# Patient Record
Sex: Female | Born: 1960 | Race: White | Hispanic: No | Marital: Married | Smoking: Former smoker
Health system: Southern US, Community
[De-identification: ages and names within clinical notes are randomized; demographics above are authoritative.]

## PROBLEM LIST (undated history)

## (undated) DIAGNOSIS — R32 Unspecified urinary incontinence: Secondary | ICD-10-CM

## (undated) DIAGNOSIS — K219 Gastro-esophageal reflux disease without esophagitis: Secondary | ICD-10-CM

## (undated) DIAGNOSIS — G4733 Obstructive sleep apnea (adult) (pediatric): Secondary | ICD-10-CM

## (undated) DIAGNOSIS — M1990 Unspecified osteoarthritis, unspecified site: Secondary | ICD-10-CM

## (undated) DIAGNOSIS — J020 Streptococcal pharyngitis: Secondary | ICD-10-CM

## (undated) DIAGNOSIS — M26609 Unspecified temporomandibular joint disorder, unspecified side: Secondary | ICD-10-CM

## (undated) DIAGNOSIS — N3946 Mixed incontinence: Secondary | ICD-10-CM

## (undated) DIAGNOSIS — I10 Essential (primary) hypertension: Secondary | ICD-10-CM

## (undated) DIAGNOSIS — J019 Acute sinusitis, unspecified: Secondary | ICD-10-CM

## (undated) DIAGNOSIS — A389 Scarlet fever, uncomplicated: Secondary | ICD-10-CM

## (undated) DIAGNOSIS — G4730 Sleep apnea, unspecified: Secondary | ICD-10-CM

## (undated) DIAGNOSIS — G4710 Hypersomnia, unspecified: Secondary | ICD-10-CM

## (undated) DIAGNOSIS — R1011 Right upper quadrant pain: Secondary | ICD-10-CM

## (undated) DIAGNOSIS — Z9889 Other specified postprocedural states: Secondary | ICD-10-CM

## (undated) DIAGNOSIS — R112 Nausea with vomiting, unspecified: Secondary | ICD-10-CM

## (undated) DIAGNOSIS — R002 Palpitations: Secondary | ICD-10-CM

## (undated) DIAGNOSIS — N920 Excessive and frequent menstruation with regular cycle: Secondary | ICD-10-CM

## (undated) DIAGNOSIS — Z8709 Personal history of other diseases of the respiratory system: Secondary | ICD-10-CM

## (undated) DIAGNOSIS — R0789 Other chest pain: Secondary | ICD-10-CM

## (undated) DIAGNOSIS — C801 Malignant (primary) neoplasm, unspecified: Secondary | ICD-10-CM

## (undated) HISTORY — PX: TONSILLECTOMY: SUR1361

## (undated) HISTORY — DX: Unspecified temporomandibular joint disorder, unspecified side: M26.609

## (undated) HISTORY — DX: Gastro-esophageal reflux disease without esophagitis: K21.9

## (undated) HISTORY — DX: Hypersomnia, unspecified: G47.10

## (undated) HISTORY — DX: Morbid (severe) obesity due to excess calories: E66.01

## (undated) HISTORY — DX: Scarlet fever, uncomplicated: A38.9

## (undated) HISTORY — PX: TUBAL LIGATION: SHX77

## (undated) HISTORY — PX: EYE SURGERY: SHX253

## (undated) HISTORY — DX: Mixed incontinence: N39.46

## (undated) HISTORY — DX: Other chest pain: R07.89

## (undated) HISTORY — DX: Acute sinusitis, unspecified: J01.90

## (undated) HISTORY — DX: Excessive and frequent menstruation with regular cycle: N92.0

## (undated) HISTORY — DX: Unspecified urinary incontinence: R32

## (undated) HISTORY — PX: CHOLECYSTECTOMY: SHX55

## (undated) HISTORY — DX: Streptococcal pharyngitis: J02.0

## (undated) HISTORY — DX: Palpitations: R00.2

## (undated) HISTORY — DX: Sleep apnea, unspecified: G47.30

## (undated) HISTORY — DX: Obstructive sleep apnea (adult) (pediatric): G47.33

## (undated) HISTORY — PX: FRACTURE SURGERY: SHX138

## (undated) HISTORY — DX: Right upper quadrant pain: R10.11

## (undated) SURGERY — COLONOSCOPY WITH PROPOFOL
Anesthesia: Monitor Anesthesia Care

---
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
## 1995-10-27 HISTORY — PX: REFRACTIVE SURGERY: SHX103

## 1998-10-26 HISTORY — PX: EXTREMITY CYST EXCISION: SHX1558

## 2005-10-26 HISTORY — PX: KNEE ARTHROSCOPY: SUR90

## 2005-11-16 ENCOUNTER — Emergency Department: Payer: Self-pay | Admitting: Emergency Medicine

## 2005-11-26 ENCOUNTER — Emergency Department: Payer: Self-pay | Admitting: Internal Medicine

## 2007-01-18 ENCOUNTER — Other Ambulatory Visit: Admission: RE | Admit: 2007-01-18 | Discharge: 2007-01-18 | Payer: Self-pay | Admitting: Obstetrics & Gynecology

## 2007-03-22 ENCOUNTER — Ambulatory Visit (HOSPITAL_BASED_OUTPATIENT_CLINIC_OR_DEPARTMENT_OTHER): Admission: RE | Admit: 2007-03-22 | Discharge: 2007-03-22 | Payer: Self-pay | Admitting: Obstetrics and Gynecology

## 2007-08-15 ENCOUNTER — Emergency Department (HOSPITAL_COMMUNITY): Admission: EM | Admit: 2007-08-15 | Discharge: 2007-08-15 | Payer: Self-pay | Admitting: Emergency Medicine

## 2007-08-23 ENCOUNTER — Encounter (INDEPENDENT_AMBULATORY_CARE_PROVIDER_SITE_OTHER): Payer: Self-pay | Admitting: General Surgery

## 2007-08-23 ENCOUNTER — Ambulatory Visit (HOSPITAL_COMMUNITY): Admission: RE | Admit: 2007-08-23 | Discharge: 2007-08-24 | Payer: Self-pay | Admitting: General Surgery

## 2007-11-30 ENCOUNTER — Ambulatory Visit: Payer: Self-pay | Admitting: Family Medicine

## 2007-11-30 DIAGNOSIS — M26609 Unspecified temporomandibular joint disorder, unspecified side: Secondary | ICD-10-CM | POA: Insufficient documentation

## 2007-11-30 DIAGNOSIS — R32 Unspecified urinary incontinence: Secondary | ICD-10-CM | POA: Insufficient documentation

## 2007-11-30 DIAGNOSIS — J019 Acute sinusitis, unspecified: Secondary | ICD-10-CM | POA: Insufficient documentation

## 2007-11-30 DIAGNOSIS — K219 Gastro-esophageal reflux disease without esophagitis: Secondary | ICD-10-CM | POA: Insufficient documentation

## 2007-11-30 DIAGNOSIS — N3946 Mixed incontinence: Secondary | ICD-10-CM | POA: Insufficient documentation

## 2007-11-30 DIAGNOSIS — A389 Scarlet fever, uncomplicated: Secondary | ICD-10-CM | POA: Insufficient documentation

## 2007-11-30 LAB — CONVERTED CEMR LAB
Bilirubin Urine: NEGATIVE
Blood in Urine, dipstick: NEGATIVE
Glucose, Urine, Semiquant: NEGATIVE
Urobilinogen, UA: NEGATIVE
WBC Urine, dipstick: NEGATIVE

## 2007-12-06 LAB — CONVERTED CEMR LAB
ALT: 18 units/L (ref 0–35)
AST: 15 units/L (ref 0–37)
Alkaline Phosphatase: 118 units/L — ABNORMAL HIGH (ref 39–117)
Calcium: 8.8 mg/dL (ref 8.4–10.5)
GFR calc non Af Amer: 96 mL/min
LDL Cholesterol: 85 mg/dL (ref 0–99)
Potassium: 4.4 meq/L (ref 3.5–5.1)
Sodium: 139 meq/L (ref 135–145)
Total Bilirubin: 0.7 mg/dL (ref 0.3–1.2)
Total CHOL/HDL Ratio: 4.4
Total Protein: 7.4 g/dL (ref 6.0–8.3)

## 2007-12-13 ENCOUNTER — Encounter (INDEPENDENT_AMBULATORY_CARE_PROVIDER_SITE_OTHER): Payer: Self-pay | Admitting: *Deleted

## 2007-12-13 ENCOUNTER — Ambulatory Visit: Payer: Self-pay | Admitting: Internal Medicine

## 2007-12-13 DIAGNOSIS — J020 Streptococcal pharyngitis: Secondary | ICD-10-CM | POA: Insufficient documentation

## 2008-01-25 ENCOUNTER — Encounter: Admission: RE | Admit: 2008-01-25 | Discharge: 2008-01-25 | Payer: Self-pay | Admitting: Family Medicine

## 2008-02-09 ENCOUNTER — Ambulatory Visit: Payer: Self-pay | Admitting: Family Medicine

## 2008-02-09 DIAGNOSIS — R0789 Other chest pain: Secondary | ICD-10-CM | POA: Insufficient documentation

## 2008-02-24 ENCOUNTER — Ambulatory Visit: Payer: Self-pay | Admitting: Cardiology

## 2008-03-12 ENCOUNTER — Ambulatory Visit: Payer: Self-pay | Admitting: Cardiology

## 2008-06-05 ENCOUNTER — Telehealth: Payer: Self-pay | Admitting: Family Medicine

## 2008-06-05 ENCOUNTER — Ambulatory Visit: Payer: Self-pay | Admitting: Family Medicine

## 2008-06-05 DIAGNOSIS — R1011 Right upper quadrant pain: Secondary | ICD-10-CM | POA: Insufficient documentation

## 2008-06-05 LAB — CONVERTED CEMR LAB
Specific Gravity, Urine: 1.02
Urobilinogen, UA: 0.2
pH: 6

## 2008-06-06 LAB — CONVERTED CEMR LAB
ALT: 13 units/L (ref 0–35)
AST: 15 units/L (ref 0–37)
Albumin: 3.1 g/dL — ABNORMAL LOW (ref 3.5–5.2)
Basophils Relative: 0.6 % (ref 0.0–3.0)
Bilirubin, Direct: 0.1 mg/dL (ref 0.0–0.3)
Eosinophils Absolute: 0.1 10*3/uL (ref 0.0–0.7)
GFR calc Af Amer: 138 mL/min
GFR calc non Af Amer: 114 mL/min
Lipase: 19 units/L (ref 11.0–59.0)
Monocytes Relative: 8 % (ref 3.0–12.0)
Neutrophils Relative %: 55.2 % (ref 43.0–77.0)
Potassium: 3.9 meq/L (ref 3.5–5.1)
RDW: 15.9 % — ABNORMAL HIGH (ref 11.5–14.6)
Sodium: 139 meq/L (ref 135–145)
Total Protein: 6.2 g/dL (ref 6.0–8.3)
WBC: 5.7 10*3/uL (ref 4.5–10.5)

## 2008-07-09 ENCOUNTER — Telehealth: Payer: Self-pay | Admitting: Family Medicine

## 2008-07-10 ENCOUNTER — Ambulatory Visit: Payer: Self-pay | Admitting: Family Medicine

## 2008-07-10 DIAGNOSIS — N920 Excessive and frequent menstruation with regular cycle: Secondary | ICD-10-CM | POA: Insufficient documentation

## 2008-07-11 LAB — CONVERTED CEMR LAB
Basophils Absolute: 0 10*3/uL (ref 0.0–0.1)
Basophils Relative: 0.4 % (ref 0.0–3.0)
HCT: 39.7 % (ref 36.0–46.0)
Hemoglobin: 13.6 g/dL (ref 12.0–15.0)
Lymphocytes Relative: 33 % (ref 12.0–46.0)
Monocytes Absolute: 0.7 10*3/uL (ref 0.1–1.0)
Neutro Abs: 4.3 10*3/uL (ref 1.4–7.7)
RDW: 15.3 % — ABNORMAL HIGH (ref 11.5–14.6)
TSH: 1.46 microintl units/mL (ref 0.35–5.50)
WBC: 7.5 10*3/uL (ref 4.5–10.5)

## 2008-07-23 ENCOUNTER — Telehealth: Payer: Self-pay | Admitting: Family Medicine

## 2008-07-23 ENCOUNTER — Encounter (INDEPENDENT_AMBULATORY_CARE_PROVIDER_SITE_OTHER): Payer: Self-pay | Admitting: *Deleted

## 2008-09-06 ENCOUNTER — Emergency Department: Payer: Self-pay | Admitting: Emergency Medicine

## 2008-10-03 ENCOUNTER — Ambulatory Visit: Payer: Self-pay | Admitting: Family Medicine

## 2008-11-30 ENCOUNTER — Telehealth: Payer: Self-pay | Admitting: Family Medicine

## 2008-11-30 DIAGNOSIS — G4710 Hypersomnia, unspecified: Secondary | ICD-10-CM | POA: Insufficient documentation

## 2008-12-14 ENCOUNTER — Ambulatory Visit: Payer: Self-pay | Admitting: Pulmonary Disease

## 2008-12-22 ENCOUNTER — Ambulatory Visit (HOSPITAL_BASED_OUTPATIENT_CLINIC_OR_DEPARTMENT_OTHER): Admission: RE | Admit: 2008-12-22 | Discharge: 2008-12-22 | Payer: Self-pay | Admitting: Pulmonary Disease

## 2008-12-22 ENCOUNTER — Encounter: Payer: Self-pay | Admitting: Pulmonary Disease

## 2008-12-26 ENCOUNTER — Telehealth: Payer: Self-pay | Admitting: Pulmonary Disease

## 2008-12-27 ENCOUNTER — Telehealth (INDEPENDENT_AMBULATORY_CARE_PROVIDER_SITE_OTHER): Payer: Self-pay | Admitting: *Deleted

## 2009-01-01 ENCOUNTER — Ambulatory Visit: Payer: Self-pay | Admitting: Pulmonary Disease

## 2009-01-10 ENCOUNTER — Ambulatory Visit (HOSPITAL_COMMUNITY): Admission: RE | Admit: 2009-01-10 | Discharge: 2009-01-11 | Payer: Self-pay | Admitting: Orthopedic Surgery

## 2009-01-14 ENCOUNTER — Ambulatory Visit: Payer: Self-pay | Admitting: Pulmonary Disease

## 2009-01-14 DIAGNOSIS — G4733 Obstructive sleep apnea (adult) (pediatric): Secondary | ICD-10-CM | POA: Insufficient documentation

## 2009-01-25 ENCOUNTER — Encounter: Admission: RE | Admit: 2009-01-25 | Discharge: 2009-01-25 | Payer: Self-pay | Admitting: Family Medicine

## 2009-01-28 ENCOUNTER — Encounter (INDEPENDENT_AMBULATORY_CARE_PROVIDER_SITE_OTHER): Payer: Self-pay | Admitting: *Deleted

## 2009-02-01 ENCOUNTER — Ambulatory Visit: Payer: Self-pay | Admitting: Vascular Surgery

## 2009-02-01 ENCOUNTER — Encounter (INDEPENDENT_AMBULATORY_CARE_PROVIDER_SITE_OTHER): Payer: Self-pay | Admitting: Orthopedic Surgery

## 2009-02-01 ENCOUNTER — Ambulatory Visit (HOSPITAL_COMMUNITY): Admission: RE | Admit: 2009-02-01 | Discharge: 2009-02-01 | Payer: Self-pay | Admitting: Orthopedic Surgery

## 2009-05-07 ENCOUNTER — Telehealth: Payer: Self-pay | Admitting: Pulmonary Disease

## 2009-05-19 ENCOUNTER — Encounter: Payer: Self-pay | Admitting: Pulmonary Disease

## 2009-05-22 ENCOUNTER — Telehealth (INDEPENDENT_AMBULATORY_CARE_PROVIDER_SITE_OTHER): Payer: Self-pay | Admitting: *Deleted

## 2009-05-27 ENCOUNTER — Ambulatory Visit: Payer: Self-pay | Admitting: Family Medicine

## 2009-07-09 ENCOUNTER — Ambulatory Visit: Payer: Self-pay | Admitting: Family Medicine

## 2009-07-09 DIAGNOSIS — J069 Acute upper respiratory infection, unspecified: Secondary | ICD-10-CM | POA: Insufficient documentation

## 2009-07-09 LAB — CONVERTED CEMR LAB: Rapid Strep: NEGATIVE

## 2009-07-31 ENCOUNTER — Encounter: Payer: Self-pay | Admitting: Pulmonary Disease

## 2009-11-18 ENCOUNTER — Telehealth: Payer: Self-pay | Admitting: Internal Medicine

## 2010-02-12 ENCOUNTER — Encounter: Admission: RE | Admit: 2010-02-12 | Discharge: 2010-02-12 | Payer: Self-pay | Admitting: Family Medicine

## 2010-02-12 LAB — HM MAMMOGRAPHY: HM Mammogram: NEGATIVE

## 2010-03-15 ENCOUNTER — Emergency Department (HOSPITAL_COMMUNITY): Admission: EM | Admit: 2010-03-15 | Discharge: 2010-03-16 | Payer: Self-pay | Admitting: Emergency Medicine

## 2010-03-17 ENCOUNTER — Telehealth: Payer: Self-pay | Admitting: Family Medicine

## 2010-03-17 DIAGNOSIS — R002 Palpitations: Secondary | ICD-10-CM | POA: Insufficient documentation

## 2010-03-27 ENCOUNTER — Ambulatory Visit: Payer: Self-pay | Admitting: Cardiovascular Disease

## 2010-11-16 ENCOUNTER — Encounter: Payer: Self-pay | Admitting: Family Medicine

## 2010-11-25 NOTE — Progress Notes (Signed)
  Phone Note Call from Patient Call back at Home Phone [phone]   Caller: Patient Call For: Kerby Nora MD Summary of Call: Patient called she has been sick for 2 weeks daughter tested psoitive for strep at [facility] in [place] confirmed with nurse marcie. Spoke with dr Alphonsus Sias he said if i confirmed could call in penicillin 500mg  two times a day for 10 days Initial call taken by: Benny Lennert CMA Duncan Dull),  November 18, 2009 4:22 PM    New/Updated Medications: PENICILLIN V POTASSIUM 500 MG TABS (PENICILLIN V POTASSIUM) one tablet two times daily for 10 days Prescriptions: PENICILLIN V POTASSIUM 500 MG TABS (PENICILLIN V POTASSIUM) one tablet two times daily for 10 days  #20 x 0   Entered by:   Benny Lennert CMA (AAMA)   Authorized by:   Cindee Salt MD   Signed by:   Benny Lennert CMA (AAMA) on 11/18/2009   Method used:   Electronically to        [facility]  [phone]* (retail)       [address]       [place], [place]  [address]       Ph: [phone] or [phone]       Fax: [phone]   RxID:   [id number]

## 2010-11-25 NOTE — Assessment & Plan Note (Signed)
Summary: HEADACHE AND FEVER,SINUS,ETC  CYD   Vital Signs:  Patient profile:   50 year old female Height:      63.5 inches Weight:      229.25 pounds BMI:     40.12 Temp:     98.2 degrees F oral Pulse rate:   88 / minute Pulse rhythm:   regular Resp:     16 per minute BP sitting:   108 / 70  (left arm) Cuff size:   large  Vitals Entered By: Lewanda Rife LPN (July 09, 2009 11:23 AM)  Primary Care Provider:  Ermalene Searing  CC:  headache, fever, sinus, sorethroat, and earsache and feels achy.  History of Present Illness: Here for URI signs with fever--onset x 4d--started with HA and fever --no cough --fevers stopped yesterday --taking mucinex 12h,   tylenol, flonase --started yesterday --missed work yesterday and today  Allergies: 1)  ! Erythromycin Base (Erythromycin Base)  Review of Systems      See HPI  Physical Exam  General:  alert, well-developed, well-nourished, and well-hydrated.   Eyes:  pupils equal, pupils round, and no injection.   Ears:  TMs retracted with increased fluid Nose:  no airflow obstruction, mucosal erythema, and mucosal edema.  sinuses neg Mouth:  no exudates and pharyngeal erythema.   Lungs:  moist cough only, no crackles and no wheezes.   Neurologic:  alert & oriented X3 and gait normal.   Cervical Nodes:  no anterior cervical adenopathy and no posterior cervical adenopathy.   Psych:  normally interactive and subdued.     Impression & Recommendations:  Problem # 1:  URI (ICD-465.9) Assessment New continue comfort care measures: increase po fluids, rest, tylenol or IBP as needed will continue to use flonase--discussed proper technique to administer will add claritin 1-2 once daily as needed congestion work note through 9/16--see back or call if worsens and will add ABT Her updated medication list for this problem includes:    Mobic 15 Mg Tabs (Meloxicam) .Marland Kitchen... 1 once daily  Complete Medication List: 1)  Mobic 15 Mg Tabs (Meloxicam) .Marland Kitchen.. 1  once daily  Current Allergies (reviewed today): ! ERYTHROMYCIN BASE (ERYTHROMYCIN BASE)  Laboratory Results  Date/Time Received: July 09, 2009 11:25 AM  Date/Time Reported: July 09, 2009 11:25 AM   Other Tests  Rapid Strep: negative  Kit Test Internal QC: Positive   (Normal Range: Negative)

## 2010-11-25 NOTE — Assessment & Plan Note (Signed)
Summary: EC6/AMD   Visit Type:  Initial Consult Referring Provider:  DR. Kerby Nora Primary Provider:  Kerby Nora MD  CC:  palpitations.  History of Present Illness: [name] is a very pleasant obese 50 year old woman with a history of obstructive sleep apnea who has been wearing CPAP over the past year, presenting for evaluation of recent cardiac palpitations and fluttering.  She reports that her symptoms have been going on for years that have been severe over the past week or 2. She states that it has been going on all week. When she presented to the emergency room for further evaluation, there were no arrhythmias noted on telemetry. She was referred for followup with her primary care physician. She reports when she has the fluttering, she feels that she has to cough. It comes on at rest and not with exertion. She feels that it does not stop and it is irregular and keeps going. She is unable to detail whether it is rapid or irregular rhythm.  She denies any new lower extremity edema. No new shortness of breath with exertion. Otherwise she has felt well. She is trying to lose some weight and interested in any methods that might help her.  Current Medications (verified): 1)  None  Allergies (verified): 1)  ! 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 cause, HTN sister: fibromyalgia brother: ? heart issue MGM: colon cancer  Social History: Last updated: 12/14/2008 Occupation: office support [employer] Married Former smoker.  Quit in 2003. Alcohol use-no Drug use-no Regular exercise-no Diet: fruits and veggies, chicken, no water, lots of decaf  Risk Factors: Exercise: no (11/30/2007)  Risk Factors: Smoking Status: never (11/30/2007)  Review of Systems       The patient complains of weight gain.  The patient denies fever, weight loss, vision loss, decreased hearing, hoarseness, chest pain, syncope, dyspnea on exertion, peripheral edema, prolonged cough, abdominal pain, incontinence, muscle weakness, depression, and enlarged lymph nodes.         palpitations, fluttering in chest x >1 week  Vital Signs:  Patient profile:   50 year old female Height:      63.5 inches Weight:      245 pounds BMI:     42.87 Pulse rate:   81 / minute BP sitting:   155 / 93  (left arm) Cuff size:   large  Vitals Entered By: Bishop Dublin, CMA (March 27, 2010 2:59 PM)  Physical Exam  General:  Well developed, well nourished, in no acute distress.Obese Head:  normocephalic and atraumatic Neck:  Neck supple, no JVD. No masses, thyromegaly or abnormal cervical nodes. Lungs:  Clear bilaterally to auscultation and percussion. Heart:  Non-displaced PMI, chest non-tender; regular rate and rhythm, S1, S2 without murmurs, rubs or gallops. Carotid upstroke normal, no bruit. . Pedals normal pulses. No edema, no varicosities. Abdomen:  Bowel sounds positive; abdomen soft and non-tender without masses, organomegaly, or hernias noted. No hepatosplenomegaly. Msk:  Back normal, normal gait. Muscle strength  and tone normal. Pulses:  pulses normal in all 4 extremities Extremities:  No clubbing or cyanosis. Neurologic:  Alert and  oriented x 3. Skin:  Intact without lesions or rashes. Psych:  Normal affect.    EKG  Procedure date:  03/27/2010  Findings:      normal sinus rhythm with rate 81 beats per minute, no significant ST or T wave changes.  Impression & Recommendations:  Problem # 1:  PALPITATIONS (ICD-785.1) etiology of her chest pain is uncertain. She may have ectopy including APCs VPCs. I cannot exclude atrial fibrillation or atrial flutter or even SVT. We have suggested she wear an event monitor and keep a diary.  I have given her a prescription for metoprolol tartrate 25 mg b.i.d. to take as needed if she has bad days with palpitations.  We went with her again in 6 weeks to go over her event monitor and asked her to contact me if her symptoms are significant In the meantime.  Her updated medication list for this problem includes:    Metoprolol Tartrate 25 Mg Tabs (Metoprolol tartrate) .Marland Kitchen... Take one tablet by mouth twice a day as needed for palpatations  Problem # 2:  MORBID OBESITY (ICD-278.01) we talked a little bit about ways to lose weight including various diets, increasing her exercise. I encouraged her to join a gym as this will provide some Versed he and his social placed to exercise.  Patient Instructions: 1)  Your physician recommends that you schedule a follow-up appointment in: 6 WEEKS  2)  Your physician has recommended you make the following change in your medication: METOPROLOL 25MG  two times a day as needed FOR PALPATATIONS.  3)  Your physician has recommended that you wear an event monitor.  Event monitors are medical devices that record the heart's electrical activity. Doctors most often use these monitors to diagnose arrhythmias. Arrhythmias are problems with the speed or rhythm of the heartbeat. The monitor is a small, portable device. You can wear one while you do your normal daily activities. This is usually used to diagnose what is causing palpitations/syncope (passing  out). Prescriptions: METOPROLOL TARTRATE 25 MG TABS (METOPROLOL TARTRATE) Take one tablet by mouth twice a day as needed FOR PALPATATIONS  #60 x 3   Entered by:   Benedict Needy, RN   Authorized by:   Dossie Arbour MD   Signed by:   Benedict Needy, RN on 03/27/2010   Method used:   Electronically to        [facility]  [phone]* (retail)       [address]       [place], [place]  [address]       Ph: [phone] or [phone]       Fax: [phone]   RxID:   [id number]

## 2010-11-25 NOTE — Progress Notes (Signed)
Summary: heart palpitations   Phone Note Call from Patient Call back at [phone]   Caller: Patient Call For: Kerby Nora MD/ Dr. Patsy Lager Summary of Call: Patient was seen at [facility] ER over the weekend with having heart palpitations and she says that it happens quite often, but was very frequent over the weekend. She was there for 5 hrs. and the palpitations would never show up on the EKG. She wants to know if she needs to come in and follow up with you or wait and see if it happens again. They also mentioned a portable monitor to her and she wants to know if you think that would be a good idea. Please advise.  Initial call taken by: Melody Comas,  Mar 17, 2010 1:25 PM  Follow-up for Phone Call        I think that is a reasonable thing -- I would probably have her see cardiology to get their opinion.  Cardiac event monitor is reasonable to think about. Follow-up by: Hannah Beat MD,  Mar 17, 2010 2:47 PM  New Problems: PALPITATIONS (ICD-785.1)   New Problems: PALPITATIONS (ICD-785.1)

## 2011-01-12 LAB — URINALYSIS, ROUTINE W REFLEX MICROSCOPIC
Bilirubin Urine: NEGATIVE
Glucose, UA: NEGATIVE mg/dL
Nitrite: NEGATIVE
Protein, ur: NEGATIVE mg/dL

## 2011-01-12 LAB — RAPID URINE DRUG SCREEN, HOSP PERFORMED
Amphetamines: NOT DETECTED
Barbiturates: NOT DETECTED
Benzodiazepines: NOT DETECTED
Cocaine: NOT DETECTED

## 2011-01-12 LAB — CBC
MCHC: 34.1 g/dL (ref 30.0–36.0)
MCV: 88.9 fL (ref 78.0–100.0)
Platelets: 270 10*3/uL (ref 150–400)
RBC: 4.18 MIL/uL (ref 3.87–5.11)
RDW: 14.4 % (ref 11.5–15.5)

## 2011-01-12 LAB — DIFFERENTIAL
Eosinophils Relative: 1 % (ref 0–5)
Lymphocytes Relative: 30 % (ref 12–46)
Neutro Abs: 4.8 10*3/uL (ref 1.7–7.7)
Neutrophils Relative %: 61 % (ref 43–77)

## 2011-01-12 LAB — POCT CARDIAC MARKERS: CKMB, poc: <1 ng/mL — ABNORMAL LOW (ref 1.0–8.0)

## 2011-01-12 LAB — BASIC METABOLIC PANEL
BUN: 16 mg/dL (ref 6–23)
Chloride: 105 mEq/L (ref 96–112)

## 2011-02-05 LAB — BASIC METABOLIC PANEL
Potassium: 4.9 mEq/L (ref 3.5–5.1)
Sodium: 139 mEq/L (ref 135–145)

## 2011-02-05 LAB — APTT: aPTT: 30 seconds (ref 24–37)

## 2011-02-05 LAB — URINALYSIS, ROUTINE W REFLEX MICROSCOPIC
Bilirubin Urine: NEGATIVE
Ketones, ur: NEGATIVE mg/dL
Nitrite: NEGATIVE
Protein, ur: NEGATIVE mg/dL
Urobilinogen, UA: 1 mg/dL (ref 0.0–1.0)

## 2011-02-05 LAB — DIFFERENTIAL
Basophils Absolute: 0 10*3/uL (ref 0.0–0.1)
Eosinophils Absolute: 0 10*3/uL (ref 0.0–0.7)
Neutro Abs: 3.9 10*3/uL (ref 1.7–7.7)
Neutrophils Relative %: 63 % (ref 43–77)

## 2011-02-05 LAB — CBC
HCT: 41.6 % (ref 36.0–46.0)
Hemoglobin: 13.9 g/dL (ref 12.0–15.0)
MCHC: 33.5 g/dL (ref 30.0–36.0)
MCV: 90.6 fL (ref 78.0–100.0)
Platelets: 295 10*3/uL (ref 150–400)
WBC: 6.2 10*3/uL (ref 4.0–10.5)

## 2011-02-16 ENCOUNTER — Other Ambulatory Visit: Payer: Self-pay | Admitting: Family Medicine

## 2011-02-16 DIAGNOSIS — Z1231 Encounter for screening mammogram for malignant neoplasm of breast: Secondary | ICD-10-CM

## 2011-02-19 ENCOUNTER — Ambulatory Visit
Admission: RE | Admit: 2011-02-19 | Discharge: 2011-02-19 | Disposition: A | Discharge status: Home / Self Care | Payer: BC Managed Care – PPO | Source: Ambulatory Visit | Attending: Family Medicine | Admitting: Family Medicine

## 2011-02-19 DIAGNOSIS — Z1231 Encounter for screening mammogram for malignant neoplasm of breast: Secondary | ICD-10-CM

## 2011-02-24 ENCOUNTER — Encounter: Payer: Self-pay | Admitting: *Deleted

## 2011-03-10 NOTE — Op Note (Signed)
NAMEYVONDA, FOUTY                  ACCOUNT NO.:  [id number]   MEDICAL RECORD NO.:  [id number]          PATIENT TYPE:  OIB   LOCATION:  1525                         FACILITY:  [facility]   PHYSICIAN:  Anselm Pancoast. Weatherly, M.D.DATE OF BIRTH:  [birth year]   DATE OF PROCEDURE:  08/23/2007  DATE OF DISCHARGE:                               OPERATIVE REPORT   PREOPERATIVE DIAGNOSIS:  Subacute cholecystitis with stones.   POSTOPERATIVE DIAGNOSIS:  Subacute cholecystitis with stones.   OPERATIONS:  Laparoscopic cholecystectomy with cholangiogram.   HISTORY:  Carol Crosby is a 50 year old female who was referred to our  office from the ER at [facility] where she presented last Monday with an  attack that had lasted all Sunday night and they did an extensive workup  including liver function studies.  Ultrasound of the gallbladder showed  a large stone within the gallbladder.  They did not give her any pain  medication but advised her to be seen by the general surgeons and I  first saw her in the office yesterday.  She was still kind of mildly  tender, not febrile, but definitely tender with palpation of the right  upper quadrant and I recommended we go ahead and proceed with a fairly  prompt cholecystectomy.  She is about 230 pounds, is on only medications  pain which I gave her yesterday.  I repeated the CBC and CMET today  since it has been a week and they were still normal and she is here for  the planned procedure.  She was preoperatively given 3 grams of Unasyn.  She has got PAS stockings and was taken to the operative suite.  Induction of general anesthesia, endotracheal tube, oral tube to  stomach.  The time-out was done the patient identified.  She received  antibiotics.  Everybody was in agreement, etc.  We then made a small  incision below the umbilicus.  The fatty tissues down to 3 inches or so  below the fascia.  The fascia was picked up with two Kochers and small  opening made.  The  underlying peritoneum was picked up kind of deep and  a small opening made.  A pursestring suture of 0 Vicryl was placed and  the Hasson cannula introduced.  The gallbladder was quite tense but it  is not acutely hemorrhagic type inflamed.  It is definitely edematous.  The upper 10 mL trocar was placed after anesthetizing the fascia,  subxiphoid area, two lateral 5 mm trocars were placed.  She did not have  any significant adhesions around the gallbladder.  I pulled the  gallbladder up and out laterally and then the proximal portion of  gallbladder was kind of dissected, opening up the peritoneum I could  identify the cystic artery where it goes to the anterior surface of the  gallbladder just past the cystic duct lymph node and I encompassed this  with a right-angle, two clips proximally, single distally and then  divided the artery.  This then allowed the cystic duct to the straighten  out.  It was kind of an S shape where  it has been obstructed and I  encompassed it with a right angle and put a clip across the distal  cystic duct where the gallbladder joined.  I then made a little small  opening and bile was identified.  The [facility] catheter was induced, held in  place with a clip and then x-ray was obtained.  It showed good prompt  filling of the extrahepatic biliary system, good flow into the duodenum.  The cystic duct is probably about 2 cm in length.  I removed the  catheter, triply clipped the little cystic duct and then divided.  We  then very carefully teased out the gallbladder from the edematous  areolar tissue, I could see what I thing was a posterior branch of  cystic artery kind of going laterally and this was clipped and that the  gallbladder was freed from its bed with good hemostasis.  The  gallbladder was placed in an EndoCatch bag reinspected the liver bed,  looking down at the clips, everything looked fine and we then switched  the camera to the upper 10 mm port after  placing the gallbladder into an  EndoCatch bag.  I then pulled the neck of the gallbladder bag up into  the skin level, then could grab the gallbladder, pulled it up, opened  it, aspirated the bile and then using a towel ring clamp went into the  gallbladder kind of grabbed the big stone and then could kind of twist  it out through the fascia without making the incision any larger.  The  bag of course and the stones were all removed and then we placed an  additional figure-of-eight of 0 Vicryl in the fascia with a little  larger needle.  You could look under the undersurface and there was no  adhesions or anything.  I then tied the pursestring as well with the  figure-of-eight that I had placed and then placed about 15 mL of  Marcaine with Adrenalin in the fascia at the umbilicus.  The little  irrigating fluid was minimal that had been aspirated.  I then removed  the 5 mL ports under direct vision and released the CO2.  The upper 10  mL trocar was then removed.  I removed 5 mm under direct vision and then  the subcutaneous wounds were closed with 4-0 Vicryl.  Benzoin and Steri-  Strips on skin.  The patient tolerated procedure nicely.  The estimated  blood loss was minimal.  We will let her decide whether she wants to go  home today or spend the night and go home in the morning.  I think it  will be fine if she elects to go home.           ______________________________  Anselm Pancoast. Zachery Dakins, M.D.     WJW/MEDQ  D:  08/23/2007  T:  08/24/2007  Job:  [id number]

## 2011-03-10 NOTE — Procedures (Signed)
NAME:  Carol Crosby, Carol Crosby NO.:  [id number]   MEDICAL RECORD NO.:  [id number]          PATIENT TYPE:  OUT   LOCATION:  SLEEP CENTER                 FACILITY:  [facility]   PHYSICIAN:  Carol Crosby Share, MD,FCCPDATE OF BIRTH:  August 03, 1961   DATE OF STUDY:  12/22/2008                            NOCTURNAL POLYSOMNOGRAM   REFERRING PHYSICIAN:  Barbaraann Share, MD,FCCP   REFERRING PHYSICIAN:  Barbaraann Share, MD,FCCP.   LOCATION:  Sleep Lab.   INDICATIONS FOR STUDY:  Hypersomnia with sleep apnea.   EPWORTH SCORE:  10.   SLEEP ARCHITECTURE:  The patient had a total sleep time of 390 minutes  with only 39 minutes of slow wave sleep and 26 minutes of REM noted.  Sleep onset latency was normal at 29 minutes, and REM onset was  prolonged at 235 minutes.  Sleep efficiency was mildly decreased at 88%.   RESPIRATORY DATA:  The patient was found to have no obstructive apneas,  but 417 obstructive hypopneas.  This gave her an apnea-hypopnea index of  64 events per hour.  She was noted to have loud snoring throughout, and  the events occurred in all body positions.   OXYGEN DATA:  The patient had oxygen desaturation as low as 63% with her  obstructive events.  In fact, she spent 248 minutes less than 88%  saturation during the night.   CARDIAC DATA:  No clinically significant arrhythmias were seen.   MOVEMENT/ PARASOMNIA:  No significant leg jerks or abnormal behaviors  seen.   IMPRESSION/RECOMMENDATIONS:  Severe obstructive sleep apnea/ hypopnea  syndrome with an apnea-hypopnea index of 64 events per hour, and oxygen  desaturation as low as 63%.  Treatment for this degree of sleep apnea  should focus primarily on weight loss as well as CPAP.      Carol Crosby Share, MD,FCCP  Diplomate, American Board of Sleep  Medicine  Electronically Signed     KMC/MEDQ  D:  01/01/2009 08:25:54  T:  01/01/2009 20:50:06  Job:  [id number]

## 2011-03-10 NOTE — Op Note (Signed)
[name], Carol Crosby                  ACCOUNT NO.:  [id number]   MEDICAL RECORD NO.:  [id number]          PATIENT TYPE:  OIB   LOCATION:  5012                         FACILITY:  [facility]   PHYSICIAN:  Almedia Balls. Ranell Patrick, M.D. DATE OF BIRTH:  08-24-1961   DATE OF PROCEDURE:  01/10/2009  DATE OF DISCHARGE:                               OPERATIVE REPORT   PREOPERATIVE DIAGNOSES:  Left knee medial meniscus tear and lateral  meniscus tear.   POSTOPERATIVE DIAGNOSES:  1. Left knee medial meniscus tear.  2. Left knee lateral meniscus tear.  3. Left knee chondromalacia, patellofemoral joint, grade 3 and 4.  4. Lateral tibial condyle chondromalacia, grade 3.   PROCEDURE PERFORMED:  Left knee arthroscopy, partial medial and lateral  meniscectomies, abrasion chondroplasty, patellofemoral joint to bleeding  bone as well as chondroplasty lateral compartment not to bleeding bone.   SURGEON:  Almedia Balls. Ranell Patrick, MD   ASSISTANT:  None.   ANESTHESIA:  General anesthesia was used.   ESTIMATED BLOOD LOSS:  Minimal.   FLUID REPLACEMENT:  1200 mL crystalloid.   INSTRUMENT COUNT:  Correct.   COMPLICATIONS:  None.   Preoperative antibiotics were given.   INDICATIONS:  The patient is a 50 year old female with a history of  worsening left medial knee pain secondary to torn medial meniscus.  The  patient presents now for operative treatment having failed conservative  management.  Informed consent was obtained.   DESCRIPTION OF PROCEDURE:  After an adequate level of anesthesia was  achieved, the patient was positioned supine on the operating room table.  The left leg was correctly identified and a lateral approach was  utilized.  After sterile prep and drape of the left knee, we entered the  knee using standard arthroscopic portals including superolateraloutflow,  anterolateral scope, and anteromedial working portals.  We identified  the significant patellofemoral chondromalacia, grade 3 and 4,  performed  the chondroplasty of the patellar essentially in the trochlea.  There  was a large area in trochlear wear all the way down to bone.  We did  some chondroplasty getting some bleeding.  Medial and lateral gutters  were free of loose bodies.  No significant medial plica was noted.  The  anterior horn of the medial meniscus was torn.  We performed partial  meniscectomy using basket forceps and motorized shaver.  The remainder  of the middle and posterior horn of the medial meniscus was normal.  ACL, PCL were intact.  Posterior aspect of the knee normal.  The lateral  compartment was entered.  There was chondromalacia present in the tibial  condyle with loose flaps cartilage, grade 3 chondromalacia, performed a  chondroplasty on that.  There was posterior and lateral meniscus tear,  which was treated with the partial meniscectomy using basket forceps and  motorized shaver.  This represented about 20%-30% of the meniscus had to  be removed.  Popliteus is normal.  The remainder of the knee was scoped  and deemed to be normal.  At this point, we conclude the surgery  suturing the wounds with 4-0 Monocryl followed by  Steri-Strips and  sterile compressive bandage.  The patient tolerated the surgery well.      Almedia Balls. Ranell Patrick, M.D.  Electronically Signed     SRN/MEDQ  D:  01/10/2009  T:  01/11/2009  Job:  [id number]

## 2011-03-10 NOTE — Discharge Summary (Signed)
NAMESHASTINA, RUA                  ACCOUNT NO.:  [id number]   MEDICAL RECORD NO.:  [id number]          PATIENT TYPE:  OIB   LOCATION:  5012                         FACILITY:  [facility]   PHYSICIAN:  Almedia Balls. Ranell Patrick, M.D. DATE OF BIRTH:  [birth year]   DATE OF ADMISSION:  01/10/2009  DATE OF DISCHARGE:  01/11/2009                               DISCHARGE SUMMARY   ADMISSION DIAGNOSIS:  Left knee pain secondary to medial meniscal tear.   DISCHARGE DIAGNOSIS:  Left knee pain secondary to medial meniscal tear  status post left knee arthroscopy.   BRIEF HISTORY:  The patient is a 50 year old female with worsening left  knee pain secondary to medial meniscal tear.  The patient has elected  for surgical management.   PROCEDURE:  The patient had a left knee arthroscopy with medial meniscal  debridement by Dr. Malon Kindle on January 10, 2009.  No complications.  No assistant.  General anesthesia was used.   HOSPITAL COURSE:  The patient was admitted on January 10, 2009 for the  above-stated procedure which she tolerated well.  After adequate time in  Postanesthesia Care Unit, she was transferred up to 5000.  On postop day  #1, the patient complained of minimal pain but overall was doing quite  well.  She worked with physical therapy well before discharge from  hospital and she was afebrile throughout her entire stay.  Her condition  is stable.   DISCHARGE PLAN:  To be discharged home with home exercises.   ALLERGIES:  The patient has no known drug allergies.   DISCHARGE MEDICATIONS:  1. Mobic 50 mg p.o. daily.  2. Robaxin 500 mg p.o. q.6 h.  3. Percocet 5/325 one to two tablets q.4-6 hours p.r.n. pain.  4. Lovenox 30 mg subcu b.i.d.   FOLLOWUP:  The patient will follow back with Dr. Malon Kindle in 2  weeks.      Thomas B. Durwin Nora, P.A.      Almedia Balls. Ranell Patrick, M.D.  Electronically Signed    TBD/MEDQ  D:  01/11/2009  T:  01/12/2009  Job:  [id number]

## 2011-03-10 NOTE — Assessment & Plan Note (Signed)
[facility] OFFICE NOTE   Carol Crosby, Carol Crosby                         MRN:          [id number]  DATE:02/24/2008                            DOB:          1961-04-27    REFERRING PHYSICIAN:  Kerby Nora, MD   REASON FOR CONSULTATION:  Evaluate patient with chest pain.   HISTORY OF PRESENT ILLNESS:  The patient is a pleasant 50 year old white  female without prior cardiac history.  She has had palpitations for some  time.  Most recently, over the last couple of weeks, she has had chest  discomfort.  This seems to be happening about every other day.  She  describes a discomfort under her left breast.  It may radiate around to  the right breast.  It is sharp.  At one point it was 8/10 in intensity  and then lasted for a few hours, slowly resolving.  When she got it and  it was intense, she was somewhat diaphoretic.  It would go away on its  own.  There was no associated nausea, vomiting or shortness of breath.  She did not notice any palpitations with this.  She had no presyncope or  syncope.  She did not notice if it was worse with breathing or moving.  She had never had discomfort like this before.  She says it happens at  rest.  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  She had done this since the pain has been coming on.   PAST MEDICAL HISTORY:  The patient has no history of hypertension other  than toxemia during pregnancy.  She had no diabetes or high cholesterol.   PAST SURGICAL HISTORY:  1. Cholecystectomy.  2. C-section.  3. Cyst removed.   ALLERGIES:  ERYTHROMYCIN.   MEDICATIONS:  Detrol LA.   SOCIAL HISTORY:  The patient quit smoking in 2003.  She works in [employer].  She is married.  She has 3 children.   FAMILY HISTORY:  Noncontributory for early coronary artery disease.  She  does have a brother with a tachycardia; he is in [place] and apparently  going to have some kind of an ablation.   REVIEW OF SYSTEMS:  As stated in the HPI and positive for headaches,  palpitations occasionally at night, reflux, negative for other systems.   PHYSICAL EXAMINATION:  The patient is in no distress.  Blood pressure 119/83, heart rate 91 and regular, weight 230 pounds.  HEENT:  Eye are unremarkable; pupils are equal, round and reactive to  light; fundi not visualized; oral mucosa unremarkable.  NECK:  No  jugular distention at 45 degrees; carotid upstroke brisk and  symmetrical; no bruits; no thyromegaly.  LYMPHATICS:  No cervical, axillary or inguinal adenopathy.  LUNGS:  Clear to auscultation bilaterally.  BACK:  No costovertebral angle tenderness.  CHEST:  Unremarkable.  HEART:  PMI not displaced or sustained, S1-S2 within normal.  No S3, no  S4, no clicks, no rubs, no murmurs.  ABDOMEN:  Obese; positive bowel  sounds, normal in frequency and pitch;  no bruits, no rebound, no guarding, no midline pulsatile mass, no  hepatomegaly, no splenomegaly.  SKIN:  No rashes, no nodules.  EXTREMITIES:  Pulse 2+ throughout; no edema, no cyanosis, no clubbing.  NEUROLOGIC:  Oriented to person, place and time; cranial nerves II-XII  grossly intact; motor grossly intact.   EKG:  Sinus rhythm, rate 90, axis within normal, intervals within  normal, no acute ST- or T-wave changes.   ASSESSMENT AND PLAN:  1. Chest discomfort:  The patient's chest discomfort is somewhat      atypical.  She does not have severe cardiovascular risk factors.  I      think the pretest probability of obstructive coronary disease is      somewhat low.  Screening with an exercise treadmill test would be      most appropriate; this will allow me to screen for obstructive      coronary disease, risk-stratify and most importantly give this      patient a prescription for exercise.  2. Obesity:  We discussed weight loss with diet and exercise and I      will give her more instruction  on this at time of her POET (plain      old exercise treadmill).     Rollene Rotunda, MD, [facility]  Electronically Signed    JH/MedQ  DD: 02/24/2008  DT: 02/24/2008  Job #: [id number]   cc:   Kerby Nora, MD

## 2011-03-10 NOTE — Op Note (Signed)
NAMEMEDIA, PIZZINI                  ACCOUNT NO.:  [id number]   MEDICAL RECORD NO.:  [id number]          PATIENT TYPE:  AMB   LOCATION:  [facility]                         FACILITY:  [facility]   PHYSICIAN:  Cynthia P. Romine, M.D.DATE OF BIRTH:  1961/06/04   DATE OF PROCEDURE:  03/22/2007  DATE OF DISCHARGE:                               OPERATIVE REPORT   PREOPERATIVE DIAGNOSIS:  Menorrhagia.   POSTOPERATIVE DIAGNOSIS:  Menorrhagia.   PROCEDURE:  Novasure endometrial ablation and hysteroscopy.   SURGEON:  Dr. Arline Asp Romine.   ANESTHESIA:  General by LMA.   ESTIMATED BLOOD LOSS:  Minimal.   LACTATED RINGER'S DEFICIT HYSTEROSCOPY:  25 mL   COMPLICATIONS:  None.   PROCEDURE:  The patient was taken to the operating room and after  induction of adequate general anesthesia by LMA was placed in dorsal  lithotomy position and prepped and draped in usual fashion.  The bladder  was drained with a red rubber catheter.  A posterior weighted and  anterior Sims retractor placed.  The cervix was grasped on its anterior  lip with a single-tooth tenaculum.  Paracervical block was instituted by  injecting 10 mL of 1% plain Xylocaine at each of 3 and 9 o'clock.  Uterus sounded to 10 cm.  The cervix was dilated to #8 Hegar.  The  endocervical canal was estimated to be 3.5 cm long.  Therefore the  cavity length was 6.5 cm and the Novasure device was set for the cavity  length.  The Novasure was introduced.  The array was deployed.  The  array was seated and the cavity width was noted to be 4.8 cm.  The  collar was then pressed up against the cervix.  The cavity integrity  test was passed and ablation was started.  The ablation lasted 1 minute  and 12 seconds.  Following completion of the procedure the Novasure  device was removed.  Hysteroscopy was done which revealed an endometrial  cavity that appeared well ablated photographic documentation was taken.  The scope was removed.  The instruments removed  from vagina and the  procedure was terminated.  The patient was taken to recovery room in  satisfactory condition.      Cynthia P. Romine, M.D.  Electronically Signed     CPR/MEDQ  D:  03/22/2007  T:  03/22/2007  Job:  [id number]

## 2011-03-10 NOTE — Procedures (Signed)
[facility]                              EXERCISE TREADMILL   NAME:COOKPeyten, Punches                         MRN:          [id number]  DATE:03/12/2008                            DOB:          04-02-1961    PROCEDURE:  Exercise treadmill test.   INDICATION:  Chest pain.   PROCEDURE NOTE:  The patient is exercised using standard Bruce protocol.  She was able to exercise for 6 minutes and 30 seconds.  This was 30 30  seconds into stage III.  She achieved a target heart rate with a maximum  of 157 which was 90% of predicted.  She achieved 7.7 mets.  She had an  appropriate blood pressure response with a max of 161/88.  The test was  terminated because she had achieved a target heart rate and because of  fatigue.  She had no chest pain.  She had appropriate shortness of  breath though she had a poor exercise tolerance.  She had no ischemic ST-  T wave changes and no arrhythmias.  She had a normal heart rate  recovery.   CONCLUSION:  Negative adequate exercise treadmill test.  She had  moderate to poor exercise tolerance for her age.  There were no features  of high-grade obstructive coronary disease.   PLAN:  Based on the above, no further cardiovascular testing is  suggested.  The patient should continue with aggressive risk reduction.  Based on this, I gave her a prescription for exercise.     Rollene Rotunda, MD, [facility]  Electronically Signed    JH/MedQ  DD: 03/12/2008  DT: 03/12/2008  Job #: [id number]   cc:   Kerby Nora, MD

## 2011-03-16 ENCOUNTER — Ambulatory Visit (INDEPENDENT_AMBULATORY_CARE_PROVIDER_SITE_OTHER): Payer: BC Managed Care – PPO | Admitting: Family Medicine

## 2011-03-16 ENCOUNTER — Encounter: Payer: Self-pay | Admitting: Family Medicine

## 2011-03-16 ENCOUNTER — Encounter: Payer: Self-pay | Admitting: *Deleted

## 2011-03-16 VITALS — BP 110/72 | HR 96 | Temp 98.6°F | Ht 62.0 in | Wt 237.1 lb

## 2011-03-16 DIAGNOSIS — J019 Acute sinusitis, unspecified: Secondary | ICD-10-CM

## 2011-03-16 DIAGNOSIS — R509 Fever, unspecified: Secondary | ICD-10-CM

## 2011-03-16 DIAGNOSIS — R51 Headache: Secondary | ICD-10-CM

## 2011-03-16 LAB — POCT RAPID STREP A (OFFICE): Rapid Strep A Screen: NEGATIVE

## 2011-03-16 MED ORDER — DOXYCYCLINE HYCLATE 100 MG PO CAPS: 100.0000 mg | ORAL_CAPSULE | Freq: Two times a day (BID) | ORAL | Status: AC

## 2011-03-16 NOTE — Assessment & Plan Note (Addendum)
Fever with headache description similar to sinus headache.  Suspect acute sinusitis, treat with doxycycline (will cover rickettsial although pt denies tick bite). Discussed red flags to return including if not improving as expected or any worsening. Lungs clear today, heart without murmur, no neck stiffness nor abd pain, or AMS, AFVSS today. RST neg today.

## 2011-03-16 NOTE — Progress Notes (Signed)
  Subjective:    Patient ID: Carol Crosby, female    DOB: [birth year], 50 y.o.   MRN: [id number]  HPI CC: fever  3d h/o fever, Tmax 103 Saturday.  Body aches, joint pain, HA - characterized as pressure with throbbing, occasional sharp frontal and temple pain, ear pain.  HA worse with bending forward or laying down.  Last night temperature to 101.  So far has tried tyelnol, ibuprofen, pseudophed, theraflu.  No cough, ST, dysuria, chest pain/tightness, abd pain, SOB, confusion, neck stiffness.  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  No tick bites, not outside recently.  H/o sinus infections in past, somewhat feel like this but not with body aches.  A few weeks ago also with fever, cough but that got better.  No recent travel outside of country.   No sick contacts.  No smokers at home, no h/o asthma.  Review of Systems Per HPI    Objective:   Physical Exam  Nursing note and vitals reviewed. Constitutional: She appears well-developed and well-nourished.       Tired, congested appearing  HENT:  Head: Normocephalic and atraumatic.  Right Ear: Tympanic membrane, external ear and ear canal normal.  Left Ear: Tympanic membrane, external ear and ear canal normal.  Nose: Right sinus exhibits no maxillary sinus tenderness and no frontal sinus tenderness. Left sinus exhibits no maxillary sinus tenderness and no frontal sinus tenderness.  Mouth/Throat: Oropharynx is clear and moist. No oropharyngeal exudate.  Eyes: Conjunctivae and EOM are normal. Pupils are equal, round, and reactive to light. No scleral icterus.  Neck: Normal range of motion. Neck supple.  Cardiovascular: Normal rate, regular rhythm, normal heart sounds and intact distal pulses.   No murmur heard. Pulmonary/Chest: Effort normal and breath sounds normal. No respiratory distress. She has no wheezes. She has no rales.  Abdominal: Soft. Bowel sounds are normal. She exhibits no distension. There is no tenderness. There is no rebound.    Lymphadenopathy:    She has no cervical adenopathy.  Skin: Skin is warm and dry. No rash noted. No erythema.       LLE with mosquito bite. No tick bites noted.  Psychiatric: She has a normal mood and affect.          Assessment & Plan:

## 2011-03-16 NOTE — Patient Instructions (Addendum)
I think you have sinusitis. Treat with 10 day course of doxycycline. Push fluids and plenty of rest. Ibuprofen for discomfort. May use simple mucinex with plenty of water to break up mucous. Update [place] if fever not improving after 2 days, or any worsening. We will want to see you again if that's the case.

## 2011-03-18 ENCOUNTER — Telehealth: Payer: Self-pay | Admitting: *Deleted

## 2011-03-18 NOTE — Telephone Encounter (Signed)
Pt was seen on Monday and diagnosed with sinusitis.  She was told to call today if not better and she is not.  She has not had any fever since Monday night, but otherwise is still the same.  Please advise on what she should do.  I told her she may need to give the antibiotic more time to work, but she said you had told her you would do some tests.

## 2011-03-18 NOTE — Telephone Encounter (Signed)
Give doxy more time to work.  May add on nasal saline rinse or neti pot. If no change by tomorrow, will likely need re evaluation.

## 2011-03-18 NOTE — Telephone Encounter (Signed)
Spoke with patient. She said that she still has all of the same symptoms along with dizziness now. She is still taking all meds and she says that the OTC mucinex isn't breaking any of the congestion up at all. She said the fatigue is horrible. She had to rest after taking a shower this AM and again after getting her child dressed for school. I told her I would let you know and call her back.

## 2011-03-18 NOTE — Telephone Encounter (Signed)
Patient notified and will call back tomorrow if no better.

## 2011-03-18 NOTE — Telephone Encounter (Signed)
I'm glad fever is down.  That is reassuring for slow improvement in symptoms. Continue antibiotics.  Push fluids, plenty of water. Are sxs still same - body aches, pressure headache, fatgue?  Any new sxs? If not, continue abx for now.  If not better in 1-2 days or any worsening, return to be seen.

## 2011-08-05 LAB — CBC
Hemoglobin: 11.3 — ABNORMAL LOW
MCHC: 32.7
MCV: 80.1
Platelets: 346
RBC: 4.32
RDW: 16.6 — ABNORMAL HIGH
WBC: 7.6
WBC: 6.5

## 2011-08-05 LAB — COMPREHENSIVE METABOLIC PANEL
ALT: 15
Alkaline Phosphatase: 118 — ABNORMAL HIGH
CO2: 27
Chloride: 103
Glucose, Bld: 88
Potassium: 4
Sodium: 136
Total Bilirubin: 0.8
Total Protein: 6.9

## 2011-08-05 LAB — DIFFERENTIAL
Basophils Relative: 1
Basophils Relative: 2 — ABNORMAL HIGH
Eosinophils Absolute: 0
Eosinophils Absolute: 0.1
Monocytes Absolute: 0.5
Monocytes Relative: 7
Neutrophils Relative %: 76
Neutrophils Relative %: 63

## 2011-08-05 LAB — URINALYSIS, ROUTINE W REFLEX MICROSCOPIC
Leukocytes, UA: NEGATIVE
Protein, ur: NEGATIVE
Urobilinogen, UA: 1

## 2011-08-05 LAB — POCT I-STAT CREATININE: Creatinine, Ser: 0.7

## 2011-08-05 LAB — I-STAT 8, (EC8 V) (CONVERTED LAB)
Acid-Base Excess: 2
Chloride: 104
HCT: 40
Operator id: 285841
Potassium: 4.3
pCO2, Ven: 39.8 — ABNORMAL LOW

## 2011-08-05 LAB — HEPATIC FUNCTION PANEL
Alkaline Phosphatase: 100
Bilirubin, Direct: 0.2
Indirect Bilirubin: 0.5
Total Protein: 6.5

## 2011-08-05 LAB — URINE MICROSCOPIC-ADD ON

## 2011-08-05 LAB — LIPASE, BLOOD: Lipase: 22

## 2011-11-26 ENCOUNTER — Ambulatory Visit (INDEPENDENT_AMBULATORY_CARE_PROVIDER_SITE_OTHER): Payer: BC Managed Care – PPO | Admitting: Family Medicine

## 2011-11-26 ENCOUNTER — Encounter: Payer: Self-pay | Admitting: Family Medicine

## 2011-11-26 VITALS — BP 120/70 | HR 76 | Temp 97.7°F | Ht 62.0 in | Wt 239.1 lb

## 2011-11-26 DIAGNOSIS — R42 Dizziness and giddiness: Secondary | ICD-10-CM

## 2011-11-26 MED ORDER — DIAZEPAM 2 MG PO TABS: 2.0000 mg | ORAL_TABLET | Freq: Four times a day (QID) | ORAL | Status: AC | PRN

## 2011-11-26 MED ORDER — MECLIZINE HCL 25 MG PO TABS: 25.0000 mg | ORAL_TABLET | Freq: Three times a day (TID) | ORAL | Status: AC | PRN

## 2011-11-26 NOTE — Progress Notes (Signed)
  Patient Name: Carol Crosby Date of Birth: 09/14/1961 Age: 51 y.o. Medical Record Number: [id number] Gender: female Date of Encounter: 11/26/2011  History of Present Illness:  Carol Crosby is a 51 y.o. very pleasant female patient who presents with the following:  Feeling some dizziness. Ears feel congested and stopped up. Dizzy with motion and feels a little  Has been going on for 3 weeks She does have some rotational sensation and vertigo with motion. No known clear prior illness. She has a history of vertigo in the past for distantly. No trauma. She has not tried anything at home. It is not debilitating, and she is able to function.  Past Medical History, Surgical History, Social History, Family History, Problem List, Medications, and Allergies have been reviewed and updated if relevant.  Review of Systems:  GEN: No acute illnesses, no fevers, chills. GI: No n/v/d, eating normally Pulm: No SOB Interactive and getting along well at home.  Otherwise, ROS is as per the HPI.   Physical Examination: Filed Vitals:   11/26/11 1221  BP: 120/70  Pulse: 76  Temp: 97.7 F (36.5 C)  TempSrc: Oral  Height: 5\' 2"  (1.575 m)  Weight: 239 lb 1.9 oz (108.464 kg)  SpO2: 98%    Body mass index is 43.74 kg/(m^2).   GEN: WDWN, NAD, Non-toxic, A & O x 3 HEENT: Atraumatic, Normocephalic. Neck supple. No masses, No LAD. Ears and Nose: No external deformity. Vertigo is induced when rising off of the table. None particularly induced with rotational movements of the head sitting and supine. Or bending over at the waist. CV: RRR, No M/G/R. No JVD. No thrill. No extra heart sounds. PULM: CTA B, no wheezes, crackles, rhonchi. No retractions. No resp. distress. No accessory muscle use. EXTR: No c/c/e NEURO Normal gait.  PSYCH: Normally interactive. Conversant. Not depressed or anxious appearing.  Calm demeanor.    Assessment and Plan: 1. Vertigo  meclizine (ANTIVERT) 25 MG tablet, diazepam  (VALIUM) 2 MG tablet    Conservative management over the next few weeks. Likely labyrinthitis versus potential BPV. If she is still having issues in several weeks, followups.

## 2012-01-04 ENCOUNTER — Other Ambulatory Visit (HOSPITAL_COMMUNITY)
Admission: RE | Admit: 2012-01-04 | Discharge: 2012-01-04 | Disposition: A | Discharge status: Home / Self Care | Payer: BC Managed Care – PPO | Source: Ambulatory Visit | Attending: Family Medicine | Admitting: Family Medicine

## 2012-01-04 ENCOUNTER — Encounter: Payer: Self-pay | Admitting: Pulmonary Disease

## 2012-01-04 ENCOUNTER — Ambulatory Visit (INDEPENDENT_AMBULATORY_CARE_PROVIDER_SITE_OTHER): Payer: BC Managed Care – PPO | Admitting: Pulmonary Disease

## 2012-01-04 ENCOUNTER — Encounter: Payer: Self-pay | Admitting: Family Medicine

## 2012-01-04 ENCOUNTER — Ambulatory Visit (INDEPENDENT_AMBULATORY_CARE_PROVIDER_SITE_OTHER): Payer: BC Managed Care – PPO | Admitting: Family Medicine

## 2012-01-04 VITALS — BP 114/76 | HR 76 | Temp 97.5°F | Ht 62.0 in | Wt 245.0 lb

## 2012-01-04 VITALS — BP 112/80 | HR 92 | Temp 98.0°F | Ht 62.0 in | Wt 246.4 lb

## 2012-01-04 DIAGNOSIS — Z01419 Encounter for gynecological examination (general) (routine) without abnormal findings: Secondary | ICD-10-CM | POA: Insufficient documentation

## 2012-01-04 DIAGNOSIS — G4733 Obstructive sleep apnea (adult) (pediatric): Secondary | ICD-10-CM

## 2012-01-04 DIAGNOSIS — Z1159 Encounter for screening for other viral diseases: Secondary | ICD-10-CM | POA: Insufficient documentation

## 2012-01-04 NOTE — Patient Instructions (Signed)
Will get you a new cpap machine, and use the auto setting for the next few weeks to optimize your pressure.  Will let you know the results. Work on weight loss If doing well, followup with me in one year.

## 2012-01-04 NOTE — Progress Notes (Signed)
Subjective:    Patient ID: Carol Crosby, female    DOB: 1961/10/01, 51 y.o.   MRN: [id number]  HPI Here for annual gyn exam  Dr Ermalene Searing is out of office -her PCP  Last pap-- cannot remember when it was  Menses Hx of heavy menses in the past -- now thinks she is menopausal LMP was 3 years  Still having hot flashes- is getting by with that  Had some abn paps in distant past - had one bx - came back ok  No abnormal ones since that No STD exp or new partners   Is morbidly obese- with bmi over 40  mammo 4/12-- [facility] at [facility]  Does not require a referral so will schedule her own  Self exam- no lumps on self exam   Flu shot - did not get one   Patient Active Problem List  Diagnoses  . PHARYNGITIS, STREPTOCOCCAL, ACUTE  . SCARLET FEVER  . MORBID OBESITY  . OBSTRUCTIVE SLEEP APNEA  . Acute sinusitis, unspecified  . TEMPOROMANDIBULAR JOINT DISORDER  . GERD  . MENORRHAGIA  . PALPITATIONS  . CHEST PAIN, ATYPICAL  . URINARY INCONTINENCE  . INCONTINENCE, MIXED, URGE/STRESS  . ABDOMINAL PAIN, RIGHT UPPER QUADRANT  . Gynecological examination   Past Medical History  Diagnosis Date  . Other chest pain   . Morbid obesity   . Obstructive sleep apnea (adult) (pediatric)   . Hypersomnia, unspecified   . Excessive or frequent menstruation   . Abdominal pain, right upper quadrant   . Acute sinusitis, unspecified   . Streptococcal sore throat   . Mixed incontinence urge and stress (female)(female)   . Unspecified urinary incontinence   . Esophageal reflux   . Temporomandibular joint disorders, unspecified   . Scarlet fever   . Palpitations    Past Surgical History  Procedure Date  . Temporomandibular joint surgery 1981  . Cholecystectomy   . Tonsillectomy   . Extremity cyst excision 2000    Wrist  . Refractive surgery 1997  . Cesarean section 2005   History  Substance Use Topics  . Smoking status: Former Smoker    Quit date: 10/26/2001  . Smokeless tobacco: Not on  file  . Alcohol Use: No   Family History  Problem Relation Age of Onset  . Diabetes Father   . Migraines Mother   . Hypertension Mother   . Fibromyalgia Sister   . Colon cancer Maternal Grandmother   . Other Brother     ? Heart issue   Allergies  Allergen Reactions  . Erythromycin Base     REACTION: Nausea and vomiting   No current outpatient prescriptions on file prior to visit.      Review of Systems Review of Systems  Constitutional: Negative for fever, appetite change, fatigue and unexpected weight change.  Eyes: Negative for pain and visual disturbance.  Respiratory: Negative for cough and shortness of breath.   Cardiovascular: Negative for cp or palpitations    Gastrointestinal: Negative for nausea, diarrhea and constipation.  Genitourinary: Negative for urgency and frequency.  Skin: Negative for pallor or rash   Neurological: Negative for weakness, light-headedness, numbness and headaches.  Hematological: Negative for adenopathy. Does not bruise/bleed easily.  Psychiatric/Behavioral: Negative for dysphoric mood. The patient is not nervous/anxious.          Objective:   Physical Exam  Constitutional: She appears well-developed and well-nourished. No distress.       Obese and well appearing   Eyes:  Conjunctivae and EOM are normal. Pupils are equal, round, and reactive to light. No scleral icterus.  Neck: Normal range of motion. Neck supple. No thyromegaly present.  Cardiovascular: Normal rate.   Pulmonary/Chest: Effort normal and breath sounds normal. No respiratory distress. She has no wheezes.  Abdominal: Soft. Bowel sounds are normal.       No suprapubic tenderness    Genitourinary: Rectum normal, vagina normal and uterus normal. No breast swelling, tenderness, discharge or bleeding. There is no rash on the right labia. There is no rash on the left labia. Uterus is not enlarged and not tender. Cervix exhibits no motion tenderness, no discharge and no  friability. Right adnexum displays no mass, no tenderness and no fullness. Left adnexum displays no mass, no tenderness and no fullness. No vaginal discharge found.       Breast exam: No mass, nodules, thickening, tenderness, bulging, retraction, inflamation, nipple discharge or skin changes noted.  No axillary or clavicular LA.  Chaperoned exam.    Lymphadenopathy:    She has no cervical adenopathy.  Neurological: She is alert.  Skin: Skin is warm and dry. No rash noted. No erythema. No pallor.       Skin tags noted on inner thighs  Psychiatric: She has a normal mood and affect.          Assessment & Plan:

## 2012-01-04 NOTE — Progress Notes (Deleted)
  Subjective:    Patient ID: Carol Crosby, female    DOB: 26-Sep-1961, 51 y.o.   MRN: [id number]  HPI    Review of Systems     Objective:   Physical Exam        Assessment & Plan:

## 2012-01-04 NOTE — Assessment & Plan Note (Signed)
The pt was wearing cpap compliantly, but her device quit working 6mos ago.  She has not been seen in 3 yrs, and her pressure was never optimized for her.  Will take this opportunity to put on auto setting to achieve this.  I have also encouraged her to work on weight loss, and to keep up with yearly f/u visits with me. Care Plan:  At this point, will arrange for a new machine to be changed over to auto mode for 2 weeks to optimize their pressure.  I will review the downloaded data once sent by dme, and also evaluate for compliance, leaks, and residual osa.  I will call the patient and dme to discuss the results, and have the patient's machine set appropriately.  This will serve as the pt's cpap pressure titration.

## 2012-01-04 NOTE — Assessment & Plan Note (Signed)
Annual exam with pap today  Per pt last 3 paps nl  No problems- getting through menopause  Will schedule her own mam (reminded to do self breast exams)

## 2012-01-04 NOTE — Patient Instructions (Signed)
We did your pap smear and gyn exam today  Please do not forget to schedule your own mammogram for April at the breast center

## 2012-01-04 NOTE — Progress Notes (Signed)
  Subjective:    Patient ID: Carol Crosby, female    DOB: [birth year], 51 y.o.   MRN: [id number]  HPI The patient comes in today for followup of her severe obstructive sleep apnea.  At the last visit 3 years ago, she was started on CPAP and asked to return in 4 weeks.  She obviously never made that visit, and comes in today where her CPAP machine has not been working for the last 6 months.  She felt like she was sleeping much better with the CPAP, and felt that her alertness during the day was improved as well.  Unfortunately, her pressure was never optimized due to her noncompliance with office visits.  Review of Systems  Constitutional: Negative for fever and unexpected weight change.  HENT: Negative for ear pain, nosebleeds, congestion, sore throat, rhinorrhea, sneezing, trouble swallowing, dental problem, postnasal drip and sinus pressure.   Eyes: Negative for redness and itching.  Respiratory: Negative for cough, chest tightness, shortness of breath and wheezing.   Cardiovascular: Positive for palpitations. Negative for leg swelling.  Gastrointestinal: Negative for nausea and vomiting.  Genitourinary: Negative for dysuria.  Musculoskeletal: Negative for joint swelling.  Skin: Negative for rash.  Neurological: Negative for headaches.  Hematological: Does not bruise/bleed easily.  Psychiatric/Behavioral: Negative for dysphoric mood. The patient is not nervous/anxious.        Objective:   Physical Exam Obese female in no acute distress No skin breakdown or pressure necrosis from the CPAP mask Lower extremities with minimal edema, no cyanosis Alert, does not appear to be sleepy, moves all 4 extremities. Chest clear to auscultation Cardiac exam with regular rate and rhythm       Assessment & Plan:

## 2012-01-13 ENCOUNTER — Encounter: Payer: Self-pay | Admitting: *Deleted

## 2012-01-26 ENCOUNTER — Ambulatory Visit (INDEPENDENT_AMBULATORY_CARE_PROVIDER_SITE_OTHER): Payer: BC Managed Care – PPO | Admitting: Family Medicine

## 2012-01-26 ENCOUNTER — Encounter: Payer: Self-pay | Admitting: Family Medicine

## 2012-01-26 VITALS — BP 132/84 | HR 97 | Temp 98.4°F | Ht 62.0 in | Wt 251.2 lb

## 2012-01-26 DIAGNOSIS — L918 Other hypertrophic disorders of the skin: Secondary | ICD-10-CM

## 2012-01-26 DIAGNOSIS — L919 Hypertrophic disorder of the skin, unspecified: Secondary | ICD-10-CM

## 2012-01-26 DIAGNOSIS — L909 Atrophic disorder of skin, unspecified: Secondary | ICD-10-CM

## 2012-01-26 NOTE — Patient Instructions (Signed)
We removed skin tags today  You may still have a bit of bleeding for the next hour- use pressure and gauze if necessary Then you can remove gauze in about 2 hours  Keep area clean and dry with antibacterial soap and water  Antibiotic ointment (like triple antibiotic) can be used twice daily  Gauze and tape can be applied to lightly cover until healed  Please update me if any signs of infection- pain/ redness/ drainage

## 2012-01-26 NOTE — Progress Notes (Signed)
Subjective:    Patient ID: Carol Crosby, female    DOB: October 21, 1961, 51 y.o.   MRN: [id number]  HPI Has a bunch of skin tags in between legs - they are irritated and abraded  They do not fall off on her own - pull on her clothing and really bothersome More than 10-15 tags and some are larger than others   Has never had one removed before  Not allergic to any numbing medicines  Consent obtained for procedure  Patient Active Problem List  Diagnoses  . PHARYNGITIS, STREPTOCOCCAL, ACUTE  . SCARLET FEVER  . MORBID OBESITY  . OBSTRUCTIVE SLEEP APNEA  . Acute sinusitis, unspecified  . TEMPOROMANDIBULAR JOINT DISORDER  . GERD  . MENORRHAGIA  . PALPITATIONS  . CHEST PAIN, ATYPICAL  . URINARY INCONTINENCE  . INCONTINENCE, MIXED, URGE/STRESS  . ABDOMINAL PAIN, RIGHT UPPER QUADRANT  . Gynecological examination  . Cutaneous skin tags   Past Medical History  Diagnosis Date  . Other chest pain   . Morbid obesity   . Obstructive sleep apnea (adult) (pediatric)   . Hypersomnia, unspecified   . Excessive or frequent menstruation   . Abdominal pain, right upper quadrant   . Acute sinusitis, unspecified   . Streptococcal sore throat   . Mixed incontinence urge and stress (female)(female)   . Unspecified urinary incontinence   . Esophageal reflux   . Temporomandibular joint disorders, unspecified   . Scarlet fever   . Palpitations    Past Surgical History  Procedure Date  . Temporomandibular joint surgery 1981  . Cholecystectomy   . Tonsillectomy   . Extremity cyst excision 2000    Wrist  . Refractive surgery 1997  . Cesarean section 2005   History  Substance Use Topics  . Smoking status: Former Smoker    Quit date: 10/26/2001  . Smokeless tobacco: Not on file  . Alcohol Use: No   Family History  Problem Relation Age of Onset  . Diabetes Father   . Migraines Mother   . Hypertension Mother   . Fibromyalgia Sister   . Colon cancer Maternal Grandmother   . Other Brother       ? Heart issue   Allergies  Allergen Reactions  . Erythromycin Base     REACTION: Nausea and vomiting   No current outpatient prescriptions on file prior to visit.      Review of Systems Review of Systems  Constitutional: Negative for fever, appetite change, fatigue and unexpected weight change.  Eyes: Negative for pain and visual disturbance.  Respiratory: Negative for cough and shortness of breath.   Cardiovascular: Negative for cp or palpitations    Gastrointestinal: Negative for nausea, diarrhea and constipation.  Genitourinary: Negative for urgency and frequency.  Skin: Negative for pallor or rash   Neurological: Negative for weakness, light-headedness, numbness and headaches.  Hematological: Negative for adenopathy. Does not bruise/bleed easily.  Psychiatric/Behavioral: Negative for dysphoric mood. The patient is not nervous/anxious.          Objective:   Physical Exam  Constitutional: She appears well-developed and well-nourished. No distress.       Morbidly obese and well appearing    Cardiovascular: Normal rate and regular rhythm.   Abdominal: Soft. Bowel sounds are normal.  Musculoskeletal: She exhibits no edema.  Neurological: She is alert.  Skin: Skin is warm and dry. No rash noted. No erythema. No pallor.       18 skin tags noted on inner thighs of both legs -  varying in size from 4 mm to 1 mm  All skin colored and sessile  Most are abraded and irritated , but no signs of infection  Area was prepped in sterile fashion  aneth larger tags with 1% xylocaine with epi  Smaller ones with spray anesthetic All clipped sterilly with silver nitrate stick for hemostasis Areas dressed with sterile gauze and paper tape  Pt tolerated procedure well  Psychiatric: She has a normal mood and affect.          Assessment & Plan:

## 2012-01-26 NOTE — Assessment & Plan Note (Signed)
Removed from inner thighs bilaterally- see note under exam  Pt tol well  Disc aftercare with antibiotic ointment and loose dressing , (antibacterial soap and water )  Update if s/s of infection or increased pain

## 2012-03-02 ENCOUNTER — Other Ambulatory Visit: Payer: Self-pay | Admitting: Family Medicine

## 2012-03-02 DIAGNOSIS — Z1231 Encounter for screening mammogram for malignant neoplasm of breast: Secondary | ICD-10-CM

## 2012-03-11 ENCOUNTER — Ambulatory Visit: Payer: BC Managed Care – PPO

## 2012-03-19 ENCOUNTER — Encounter (HOSPITAL_BASED_OUTPATIENT_CLINIC_OR_DEPARTMENT_OTHER): Payer: Self-pay | Admitting: *Deleted

## 2012-03-19 ENCOUNTER — Emergency Department (HOSPITAL_BASED_OUTPATIENT_CLINIC_OR_DEPARTMENT_OTHER)
Admission: EM | Admit: 2012-03-19 | Discharge: 2012-03-19 | Disposition: A | Discharge status: Home / Self Care | Payer: BC Managed Care – PPO | Attending: Emergency Medicine | Admitting: Emergency Medicine

## 2012-03-19 ENCOUNTER — Emergency Department (HOSPITAL_BASED_OUTPATIENT_CLINIC_OR_DEPARTMENT_OTHER): Payer: BC Managed Care – PPO

## 2012-03-19 DIAGNOSIS — M25569 Pain in unspecified knee: Secondary | ICD-10-CM | POA: Insufficient documentation

## 2012-03-19 DIAGNOSIS — G4733 Obstructive sleep apnea (adult) (pediatric): Secondary | ICD-10-CM | POA: Insufficient documentation

## 2012-03-19 DIAGNOSIS — K219 Gastro-esophageal reflux disease without esophagitis: Secondary | ICD-10-CM | POA: Insufficient documentation

## 2012-03-19 MED ORDER — OXYCODONE-ACETAMINOPHEN 5-325 MG PO TABS: 1.0000 | ORAL_TABLET | Freq: Four times a day (QID) | ORAL | Status: AC | PRN

## 2012-03-19 MED ORDER — OXYCODONE-ACETAMINOPHEN 5-325 MG PO TABS
1.0000 | ORAL_TABLET | Freq: Once | ORAL | Status: AC
  Administered 2012-03-19: 1 via ORAL
  Filled 2012-03-19: qty 1

## 2012-03-19 NOTE — ED Notes (Signed)
rx x 1 given for percocet- pt has knee immobilizer, crutches and ice pack for home use- family present to drive

## 2012-03-19 NOTE — Discharge Instructions (Signed)
Return to the ED with any concerns including increased pain/ swelling/discoloration/numbness of foot or toes, or any other alarming symptoms

## 2012-03-19 NOTE — ED Notes (Signed)
Pt states her left knee gave out on her earlier today. Hx of surgery to same 2010

## 2012-03-19 NOTE — ED Provider Notes (Signed)
History   This chart was scribed for Ethelda Chick, MD by Shari Heritage. The patient was seen in room MH05/MH05. Patient's care was started at 1633.     CSN: [id number]  Arrival date & time 03/19/12  1633   First MD Initiated Contact with Patient 03/19/12 1642      Chief Complaint  Patient presents with  . Knee Pain    (Consider location/radiation/quality/duration/timing/severity/associated sxs/prior treatment) The history is provided by the patient. No language interpreter was used.   Carol Crosby is a 51 y.o. female who presents to the Emergency Department complaining of constant, moderate, pain in the medial aspect of her left knee associated with her knee having given out several hours ago . Patient says that she hasn't been ambulatory since then. Pt did not fall after the incident occurred. Patient with h/o of left knee surgery after a meniscus tear in 2010. Patient denies any chest pain, abdominal pain, neck pain, back pain or pain in other extremities not described in chief complaint. Patient also with h/o of cholecystectomy and Cesarean section. Patient is a former smoker.  Past Medical History  Diagnosis Date  . Other chest pain   . Morbid obesity   . Obstructive sleep apnea (adult) (pediatric)   . Hypersomnia, unspecified   . Excessive or frequent menstruation   . Abdominal pain, chronic, right upper quadrant   . Acute sinusitis, unspecified   . Streptococcal sore throat   . Mixed incontinence urge and stress (female)(female)   . Unspecified urinary incontinence   . Esophageal reflux   . Temporomandibular joint disorders, unspecified   . Scarlet fever   . Palpitations     Past Surgical History  Procedure Date  . Temporomandibular joint surgery 1981  . Cholecystectomy   . Tonsillectomy   . Extremity cyst excision 2000    Wrist  . Refractive surgery 1997  . Cesarean section 2005    Family History  Problem Relation Age of Onset  . Diabetes Father   .  Migraines Mother   . Hypertension Mother   . Fibromyalgia Sister   . Colon cancer Maternal Grandmother   . Other Brother     ? Heart issue    History  Substance Use Topics  . Smoking status: Former Smoker    Quit date: 10/26/2001  . Smokeless tobacco: Not on file  . Alcohol Use: No    OB History    Grav Para Term Preterm Abortions TAB SAB Ect Mult Living                  Review of Systems A complete 10 system review of systems was obtained and all systems are negative except as noted in the HPI and PMH.   Allergies  Erythromycin base  Home Medications   Current Outpatient Rx  Name Route Sig Dispense Refill  . IBUPROFEN 200 MG PO TABS Oral Take 800 mg by mouth every 6 (six) hours as needed. Patient used this medication for her knee.    . OXYCODONE-ACETAMINOPHEN 5-325 MG PO TABS Oral Take 1-2 tablets by mouth every 6 (six) hours as needed for pain. 15 tablet 0    BP 132/72  Pulse 86  Temp(Src) 97.5 F (36.4 C) (Oral)  Resp 20  Ht 5\' 2"  (1.575 m)  Wt 235 lb (106.595 kg)  BMI 42.98 kg/m2  SpO2 96%  Physical Exam  Nursing note and vitals reviewed. Constitutional: She is oriented to person, place, and time. She appears  well-developed and well-nourished.  HENT:  Head: Normocephalic and atraumatic.  Eyes: Conjunctivae and EOM are normal. Pupils are equal, round, and reactive to light.  Neck: Normal range of motion. Neck supple.  Cardiovascular: Normal rate, regular rhythm and normal heart sounds.   Pulmonary/Chest: Effort normal and breath sounds normal. She exhibits no tenderness.  Abdominal: Soft. Bowel sounds are normal. There is no tenderness.  Musculoskeletal: She exhibits tenderness (Tenderness in medial left joint line of knee. No patellar or fibular tenderness).       No visible deformity. Negative anterior drawer sign. Full ROM of knee with mild pain.  Neurological: She is alert and oriented to person, place, and time.  Skin: Skin is warm and dry.    Psychiatric: She has a normal mood and affect.    ED Course  Procedures (including critical care time) DIAGNOSTIC STUDIES: Oxygen Saturation is 96% on room air, adequate by my interpretation.    COORDINATION OF CARE: 5:11PM- Patient informed of current plan for treatment and evaluation and agrees with plan at this time. Will look at X-ray images. Will provide a knee immobilizer for patient to keep knee straightened.   Labs Reviewed - No data to display Dg Knee Complete 4 Views Left  03/19/2012  *RADIOLOGY REPORT*  Clinical Data: Knee pain  LEFT KNEE - COMPLETE 4+ VIEW  Comparison: None.  Findings: No fracture or dislocation is seen.  Mild patellofemoral degenerative changes.  The joint spaces are otherwise preserved.  Suspected small suprapatellar knee joint effusion.  IMPRESSION: No fracture or dislocation is seen.  Mild degenerative changes with suspected small suprapatellar knee joint effusion.  Original Report Authenticated By: Charline Bills, M.D.     1. Knee pain       MDM  Pt presenting with left knee pain.  No acute abnormality on xray.  Pt with pain on ROM and ttp over medial joint line.  Placed in knee immobilizer.  Given pain control.  Pt discharged with strict return precautions.  She is agreeable with this plan.     I personally performed the services described in this documentation, which was scribed in my presence. The recorded information has been reviewed and considered.    Ethelda Chick, MD 03/20/12 1537

## 2012-03-25 ENCOUNTER — Ambulatory Visit
Admission: RE | Admit: 2012-03-25 | Discharge: 2012-03-25 | Disposition: A | Discharge status: Home / Self Care | Payer: BC Managed Care – PPO | Source: Ambulatory Visit | Attending: Family Medicine | Admitting: Family Medicine

## 2012-03-25 DIAGNOSIS — Z1231 Encounter for screening mammogram for malignant neoplasm of breast: Secondary | ICD-10-CM

## 2013-03-03 ENCOUNTER — Other Ambulatory Visit: Payer: Self-pay | Admitting: Family Medicine

## 2013-03-03 ENCOUNTER — Other Ambulatory Visit: Payer: Self-pay

## 2013-03-03 DIAGNOSIS — Z1231 Encounter for screening mammogram for malignant neoplasm of breast: Secondary | ICD-10-CM

## 2013-03-29 ENCOUNTER — Ambulatory Visit
Admission: RE | Admit: 2013-03-29 | Discharge: 2013-03-29 | Disposition: A | Discharge status: Home / Self Care | Payer: BC Managed Care – PPO | Source: Ambulatory Visit

## 2013-03-29 DIAGNOSIS — Z1231 Encounter for screening mammogram for malignant neoplasm of breast: Secondary | ICD-10-CM

## 2013-07-21 ENCOUNTER — Telehealth: Payer: Self-pay

## 2013-07-21 ENCOUNTER — Encounter: Payer: Self-pay | Admitting: Internal Medicine

## 2013-07-21 ENCOUNTER — Ambulatory Visit (INDEPENDENT_AMBULATORY_CARE_PROVIDER_SITE_OTHER): Payer: BC Managed Care – PPO | Admitting: Internal Medicine

## 2013-07-21 VITALS — BP 130/84 | HR 75 | Temp 97.8°F | Wt 251.2 lb

## 2013-07-21 DIAGNOSIS — R319 Hematuria, unspecified: Secondary | ICD-10-CM

## 2013-07-21 LAB — POCT URINALYSIS DIPSTICK
Glucose, UA: NEGATIVE
Nitrite, UA: NEGATIVE
Urobilinogen, UA: NEGATIVE

## 2013-07-21 MED ORDER — CIPROFLOXACIN HCL 250 MG PO TABS: 250.0000 mg | ORAL_TABLET | Freq: Two times a day (BID) | ORAL | Status: DC

## 2013-07-21 NOTE — Assessment & Plan Note (Signed)
Blood does seem to be from the urine as no vulvar lesions and nothing from vagina Urine shows many RBC but only occasional WBC Will treat empirically for UTI Further eval if the blood recurs

## 2013-07-21 NOTE — Patient Instructions (Signed)
Please call for further evaluation if the blood recurs after the antibiotic

## 2013-07-21 NOTE — Addendum Note (Signed)
Addended by: Alvina Chou on: 07/21/2013 04:51 PM   Modules accepted: Orders

## 2013-07-21 NOTE — Progress Notes (Signed)
  Subjective:    Patient ID: Carol Crosby, female    DOB: 05/03/1961, 52 y.o.   MRN: [id number]  HPI No periods in many years Last night she noticed some blood in her underwear Still sees some today when she wipes No obvious hematuria  No dysuria or urgency Always goes frequently--no change No fever  No recent sexual intercourse---over past year or so Monogamous though  Current Outpatient Prescriptions on File Prior to Visit  Medication Sig Dispense Refill  . ibuprofen (ADVIL,MOTRIN) 200 MG tablet Take 800 mg by mouth every 6 (six) hours as needed. Patient used this medication for her knee.       No current facility-administered medications on file prior to visit.    Allergies  Allergen Reactions  . Erythromycin Base     REACTION: Nausea and vomiting    Past Medical History  Diagnosis Date  . Other chest pain   . Morbid obesity   . Obstructive sleep apnea (adult) (pediatric)   . Hypersomnia, unspecified   . Excessive or frequent menstruation   . Abdominal pain, right upper quadrant   . Acute sinusitis, unspecified   . Streptococcal sore throat   . Mixed incontinence urge and stress (female)(female)   . Unspecified urinary incontinence   . Esophageal reflux   . Temporomandibular joint disorders, unspecified   . Scarlet fever   . Palpitations     Past Surgical History  Procedure Laterality Date  . Temporomandibular joint surgery  1981  . Cholecystectomy    . Tonsillectomy    . Extremity cyst excision  2000    Wrist  . Refractive surgery  1997  . Cesarean section  2005    Family History  Problem Relation Age of Onset  . Diabetes Father   . Migraines Mother   . Hypertension Mother   . Fibromyalgia Sister   . Colon cancer Maternal Grandmother   . Other Brother     ? Heart issue    History   Social History  . Marital Status: Married    Spouse Name: N/A    Number of Children: N/A  . Years of Education: N/A   Occupational History  . Office Support  [employer]   Social History Main Topics  . Smoking status: Former Smoker    Quit date: 10/26/2001  . Smokeless tobacco: Not on file  . Alcohol Use: No  . Drug Use: No  . Sexual Activity: Not on file   Other Topics Concern  . Not on file   Social History Narrative   No regular exercise      Diet: fruits and veggies, chicken, no water, lots of decaf   Review of Systems Clearly not from bowels--they have been normal Appetite is fine    Objective:   Physical Exam  Constitutional: She appears well-developed. No distress.  Genitourinary:  Normal intoitus-- vagina widely open Normal urethral orifice No vaginal or vulvar lesions seen Slight injection along posterior vagina          Assessment & Plan:

## 2013-07-21 NOTE — Telephone Encounter (Signed)
Will need to check urine and look for any vulvar lesion

## 2013-07-21 NOTE — Telephone Encounter (Signed)
Pt has not had menstrual period since ablation done 2007. On 07/20/13 pt saw med dark red blood spot size of 50 cent piece near front of panty. Has not seen any blood since. Pt said her urine is darker than usual but no burning, pain or frequency of urine, no abd or back pain. No fever,diarrhea or constipation. Pt is just concerned since has seen no bleeding in 7 years. Pt scheduled appt today 4 pm with Dr Alphonsus Sias.

## 2013-07-23 LAB — URINE CULTURE: Colony Count: 2000

## 2013-08-31 ENCOUNTER — Other Ambulatory Visit: Payer: Self-pay

## 2013-11-22 ENCOUNTER — Telehealth: Payer: Self-pay | Admitting: Family Medicine

## 2013-11-22 NOTE — Telephone Encounter (Signed)
Pt left voicemail at triage, pt said she is still having trouble sleeping even with cpap machine. Pt wanted to know if she should try some type of sleep aid OTC or does she need to have an OV to discuss her sleeping problems and get a Rx medication

## 2013-11-23 NOTE — Telephone Encounter (Signed)
I have not seen her in a long time... Recommend appt to discuss sleep, oir better yet CPX with fasting labs prior unless she has elsewhere.

## 2013-11-23 NOTE — Telephone Encounter (Signed)
Left message for patient to return my call.

## 2013-11-23 NOTE — Telephone Encounter (Signed)
CPX scheduled for 01/09/2014 @ 8:30 am with Dr. Diona Browner.

## 2014-01-09 ENCOUNTER — Ambulatory Visit (INDEPENDENT_AMBULATORY_CARE_PROVIDER_SITE_OTHER): Payer: BC Managed Care – PPO | Admitting: Family Medicine

## 2014-01-09 ENCOUNTER — Encounter: Payer: Self-pay | Admitting: Family Medicine

## 2014-01-09 VITALS — BP 144/96 | HR 77 | Temp 97.9°F | Ht 62.25 in | Wt 256.0 lb

## 2014-01-09 DIAGNOSIS — Z0000 Encounter for general adult medical examination without abnormal findings: Secondary | ICD-10-CM

## 2014-01-09 DIAGNOSIS — Z13220 Encounter for screening for lipoid disorders: Secondary | ICD-10-CM

## 2014-01-09 DIAGNOSIS — R5383 Other fatigue: Secondary | ICD-10-CM

## 2014-01-09 DIAGNOSIS — R030 Elevated blood-pressure reading, without diagnosis of hypertension: Secondary | ICD-10-CM

## 2014-01-09 DIAGNOSIS — M25569 Pain in unspecified knee: Secondary | ICD-10-CM

## 2014-01-09 DIAGNOSIS — R0789 Other chest pain: Secondary | ICD-10-CM

## 2014-01-09 DIAGNOSIS — M25561 Pain in right knee: Secondary | ICD-10-CM | POA: Insufficient documentation

## 2014-01-09 DIAGNOSIS — R071 Chest pain on breathing: Secondary | ICD-10-CM

## 2014-01-09 DIAGNOSIS — R319 Hematuria, unspecified: Secondary | ICD-10-CM

## 2014-01-09 DIAGNOSIS — I10 Essential (primary) hypertension: Secondary | ICD-10-CM | POA: Insufficient documentation

## 2014-01-09 DIAGNOSIS — R5381 Other malaise: Secondary | ICD-10-CM

## 2014-01-09 DIAGNOSIS — J392 Other diseases of pharynx: Secondary | ICD-10-CM

## 2014-01-09 LAB — COMPREHENSIVE METABOLIC PANEL
ALBUMIN: 4 g/dL (ref 3.5–5.2)
ALK PHOS: 132 U/L — AB (ref 39–117)
ALT: 16 U/L (ref 0–35)
AST: 18 U/L (ref 0–37)
BUN: 17 mg/dL (ref 6–23)
CALCIUM: 8.8 mg/dL (ref 8.4–10.5)
CHLORIDE: 104 meq/L (ref 96–112)
CO2: 21 mEq/L (ref 19–32)
Creatinine, Ser: 0.7 mg/dL (ref 0.4–1.2)
GFR: 98.23 mL/min (ref >60.00)
GLUCOSE: 96 mg/dL (ref 70–99)
POTASSIUM: 3.7 meq/L (ref 3.5–5.1)
SODIUM: 138 meq/L (ref 135–145)
TOTAL PROTEIN: 7.4 g/dL (ref 6.0–8.3)
Total Bilirubin: 0.6 mg/dL (ref 0.3–1.2)

## 2014-01-09 LAB — POCT URINALYSIS DIPSTICK
GLUCOSE UA: NEGATIVE
KETONES UA: NEGATIVE
Nitrite, UA: NEGATIVE
RBC UA: NEGATIVE
SPEC GRAV UA: 1.02
Urobilinogen, UA: 0.2
pH, UA: 6

## 2014-01-09 LAB — CBC WITH DIFFERENTIAL/PLATELET
BASOS PCT: 0.5 % (ref 0.0–3.0)
Basophils Absolute: 0 10*3/uL (ref 0.0–0.1)
EOS PCT: 1.2 % (ref 0.0–5.0)
Eosinophils Absolute: 0.1 10*3/uL (ref 0.0–0.7)
HCT: 39.6 % (ref 36.0–46.0)
HEMOGLOBIN: 13.1 g/dL (ref 12.0–15.0)
LYMPHS PCT: 34.2 % (ref 12.0–46.0)
Lymphs Abs: 1.9 10*3/uL (ref 0.7–4.0)
MCHC: 33.1 g/dL (ref 30.0–36.0)
MCV: 91.1 fl (ref 78.0–100.0)
Monocytes Absolute: 0.4 10*3/uL (ref 0.1–1.0)
Monocytes Relative: 7.5 % (ref 3.0–12.0)
NEUTROS ABS: 3.2 10*3/uL (ref 1.4–7.7)
NEUTROS PCT: 56.6 % (ref 43.0–77.0)
Platelets: 263 10*3/uL (ref 150.0–400.0)
RBC: 4.35 Mil/uL (ref 3.87–5.11)
RDW: 14.7 % — ABNORMAL HIGH (ref 11.5–14.6)
WBC: 5.6 10*3/uL (ref 4.5–10.5)

## 2014-01-09 LAB — LIPID PANEL
CHOLESTEROL: 156 mg/dL (ref 0–200)
HDL: 44.3 mg/dL (ref >39.00)
LDL CALC: 92 mg/dL (ref 0–99)
Total CHOL/HDL Ratio: 4
Triglycerides: 99 mg/dL (ref 0.0–149.0)
VLDL: 19.8 mg/dL (ref 0.0–40.0)

## 2014-01-09 LAB — TSH: TSH: 1.13 u[IU]/mL (ref 0.35–5.50)

## 2014-01-09 NOTE — Progress Notes (Signed)
Subjective:    Patient ID: Carol Crosby, female    DOB: 10-Aug-1961, 53 y.o.   MRN: [id number]  HPI  The patient is here for annual wellness exam and preventative care.    She has been having multiple medical concerns.  1. She has noted catching in neck when singing. Has continued to note off and on since initially during concert in [id number]/9735. Occ when talking, more prominent when singing. No hoarseness. No SOB.  Slightly tender on left side of anterior neck. No neck swelling. No issues with swallowing.  No heartburn, No PND, allergies.  2. Area soreness in left upper chest wall/ breast ongoing x 2-3 weeks. Staying the same not worsening.  Noted first when moving left arm. No lump.  3.Seen for hematuria in 06/2013 .Marland Kitchen Treated with cipro for UTI.  U culture was negative.  Has not seen any more blood in urine.  4. [place] after lost balance 2 weeks ago Hit right anterior shin and knee.  Has bruising that remains Pain with leaning on right knee. Tightness to bend knee, but minimal pain to walk.  5. Trouble sleeping at night for several months. Most issue with waking up frequently, some trouble falling asleep. Sleeps about 1-2 hours some nights. Has tried melatonin without relief. Wear CPAP at night without problems for OSA. No depression, no anxiety.  Minimal stress.  Minimal nocturia.  6. BP elevated today.  Does not check at home.  Exercise: None  Diet: poor  Review of Systems  Constitutional: Negative for fever and fatigue.  HENT: Negative for ear pain.   Eyes: Negative for pain.  Respiratory: Negative for chest tightness and shortness of breath.   Cardiovascular: Negative for chest pain, palpitations and leg swelling.  Gastrointestinal: Negative for abdominal pain.  Genitourinary: Negative for dysuria.  Neurological:       Trouble sleeping       Objective:   Physical Exam  Constitutional: Vital signs are normal. She appears well-developed and well-nourished. She is  cooperative.  Non-toxic appearance. She does not appear ill. No distress.  Morbidly obese  HENT:  Head: Normocephalic.  Right Ear: Hearing, tympanic membrane, external ear and ear canal normal.  Left Ear: Hearing, tympanic membrane, external ear and ear canal normal.  Nose: Nose normal.  Eyes: Conjunctivae, EOM and lids are normal. Pupils are equal, round, and reactive to light. Lids are everted and swept, no foreign bodies found.  Neck: Trachea normal and normal range of motion. Neck supple. Carotid bruit is not present. No edema present. No mass and no thyromegaly present.  Cardiovascular: Normal rate, regular rhythm, S1 normal, S2 normal, normal heart sounds and intact distal pulses.  Exam reveals no gallop.   No murmur heard. Pulmonary/Chest: Effort normal and breath sounds normal. No respiratory distress. She has no wheezes. She has no rhonchi. She has no rales.  Abdominal: Soft. Normal appearance and bowel sounds are normal. She exhibits no distension, no fluid wave, no abdominal bruit and no mass. There is no hepatosplenomegaly. There is no tenderness. There is no rebound, no guarding and no CVA tenderness. No hernia.  Genitourinary: Vagina normal and uterus normal. No breast swelling, tenderness, discharge or bleeding. Pelvic exam was performed with patient supine. There is no rash, tenderness or lesion on the right labia. There is no rash, tenderness or lesion on the left labia. Uterus is not enlarged and not tender. Right adnexum displays no mass, no tenderness and no fullness. Left adnexum displays no mass,  no tenderness and no fullness.  No pap performed  Musculoskeletal:       Left upper leg: She exhibits tenderness and bony tenderness. She exhibits no swelling.  ttp on patella... No joint line pain, no pain with movement.  Lymphadenopathy:    She has no cervical adenopathy.    She has no axillary adenopathy.  Neurological: She is alert. She has normal strength. No cranial nerve  deficit or sensory deficit.  Skin: Skin is warm, dry and intact. No rash noted.  Bruising and swelling down right anterior leg  Psychiatric: Her speech is normal and behavior is normal. Judgment normal. Her mood appears not anxious. Cognition and memory are normal. She does not exhibit a depressed mood.          Assessment & Plan:  The patient's preventative maintenance and recommended screening tests for an annual wellness exam were reviewed in full today. Brought up to date unless services declined.  Counselled on the importance of diet, exercise, and its role in overall health and mortality. The patient's FH and SH was reviewed, including their home life, tobacco status, and drug and alcohol status.   Vaccines:uptodate with Td, next due 2016. Mammo: nml 03/2013 PAP/ DVE: nml in 2013 on q 3 year schedule, DVE yearly.  nonsmoker

## 2014-01-09 NOTE — Progress Notes (Signed)
Pre visit review using our clinic review tool, if applicable. No additional management support is needed unless otherwise documented below in the visit note. 

## 2014-01-09 NOTE — Assessment & Plan Note (Signed)
Encouraged exercise, weight loss, healthy eating habits. Disucssed weight loss techniques.  Consider nutrition referral.

## 2014-01-09 NOTE — Assessment & Plan Note (Addendum)
Re-eval with UA: negative today for hematuria

## 2014-01-09 NOTE — Assessment & Plan Note (Signed)
No mass on exam. Check TSH. Consider [place] or ENT referral for vocal cord eval.

## 2014-01-09 NOTE — Patient Instructions (Addendum)
Stop at lab on way put.  We will follow left chest wall pain and throat catch... Re-eval at next OV.  If knee pain not improving at next OV consider X-ray. Start sleep hygiene protocol and change sleeping habits per info below. Get BP cuff, check BP daily, record.  Follow up in 2 weeks for BP 30 min OV.    Insomnia Insomnia is frequent trouble falling and/or staying asleep. Insomnia can be a long term problem or a short term problem. Both are common. Insomnia can be a short term problem when the wakefulness is related to a certain stress or worry. Long term insomnia is often related to ongoing stress during waking hours and/or poor sleeping habits. Overtime, sleep deprivation itself can make the problem worse. Every little thing feels more severe because you are overtired and your ability to cope is decreased. CAUSES   Stress, anxiety, and depression.  Poor sleeping habits.  Distractions such as TV in the bedroom.  Naps close to bedtime.  Engaging in emotionally charged conversations before bed.  Technical reading before sleep.  Alcohol and other sedatives. They may make the problem worse. They can hurt normal sleep patterns and normal dream activity.  Stimulants such as caffeine for several hours prior to bedtime.  Pain syndromes and shortness of breath can cause insomnia.  Exercise late at night.  Changing time zones may cause sleeping problems (jet lag). It is sometimes helpful to have someone observe your sleeping patterns. They should look for periods of not breathing during the night (sleep apnea). They should also look to see how long those periods last. If you live alone or observers are uncertain, you can also be observed at a sleep clinic where your sleep patterns will be professionally monitored. Sleep apnea requires a checkup and treatment. Give your caregivers your medical history. Give your caregivers observations your family has made about your sleep.  SYMPTOMS    Not feeling rested in the morning.  Anxiety and restlessness at bedtime.  Difficulty falling and staying asleep. TREATMENT   Your caregiver may prescribe treatment for an underlying medical disorders. Your caregiver can give advice or help if you are using alcohol or other drugs for self-medication. Treatment of underlying problems will usually eliminate insomnia problems.  Medications can be prescribed for short time use. They are generally not recommended for lengthy use.  Over-the-counter sleep medicines are not recommended for lengthy use. They can be habit forming.  You can promote easier sleeping by making lifestyle changes such as:  Using relaxation techniques that help with breathing and reduce muscle tension.  Exercising earlier in the day.  Changing your diet and the time of your last meal. No night time snacks.  Establish a regular time to go to bed.  Counseling can help with stressful problems and worry.  Soothing music and white noise may be helpful if there are background noises you cannot remove.  Stop tedious detailed work at least one hour before bedtime. HOME CARE INSTRUCTIONS   Keep a diary. Inform your caregiver about your progress. This includes any medication side effects. See your caregiver regularly. Take note of:  Times when you are asleep.  Times when you are awake during the night.  The quality of your sleep.  How you feel the next day. This information will help your caregiver care for you.  Get out of bed if you are still awake after 15 minutes. Read or do some quiet activity. Keep the lights down. Wait until  you feel sleepy and go back to bed.  Keep regular sleeping and waking hours. Avoid naps.  Exercise regularly.  Avoid distractions at bedtime. Distractions include watching television or engaging in any intense or detailed activity like attempting to balance the household checkbook.  Develop a bedtime ritual. Keep a familiar  routine of bathing, brushing your teeth, climbing into bed at the same time each night, listening to soothing music. Routines increase the success of falling to sleep faster.  Use relaxation techniques. This can be using breathing and muscle tension release routines. It can also include visualizing peaceful scenes. You can also help control troubling or intruding thoughts by keeping your mind occupied with boring or repetitive thoughts like the old concept of counting sheep. You can make it more creative like imagining planting one beautiful flower after another in your backyard garden.  During your day, work to eliminate stress. When this is not possible use some of the previous suggestions to help reduce the anxiety that accompanies stressful situations. MAKE SURE YOU:   Understand these instructions.  Will watch your condition.  Will get help right away if you are not doing well or get worse. Document Released: 10/09/2000 Document Revised: 01/04/2012 Document Reviewed: 11/09/2007 [facility] Patient Information 2014 [place].

## 2014-01-09 NOTE — Assessment & Plan Note (Signed)
Follow at home. Eval for secondary cause with labs. Follow up in 2 weeks, may need medication. Encouraged exercise, weight loss, healthy eating habits.

## 2014-01-09 NOTE — Assessment & Plan Note (Signed)
Non exertional .. Only with palpation or moving left arm. PAin felt when pectoralis muscle moving... ? Strain with recent fall.  If not improving consider diagnostic mammo etc.

## 2014-01-15 ENCOUNTER — Encounter: Payer: Self-pay | Admitting: Family Medicine

## 2014-01-15 MED ORDER — LOSARTAN POTASSIUM-HCTZ 50-12.5 MG PO TABS: 1.0000 | ORAL_TABLET | Freq: Every day | ORAL | Status: DC

## 2014-01-15 NOTE — Addendum Note (Signed)
Addended by: Eliezer Lofts E on: 01/15/2014 09:34 PM   Modules accepted: Orders

## 2014-01-15 NOTE — Telephone Encounter (Signed)
See pt note. Call to make sure she gets this message since she requested a call back.

## 2014-01-15 NOTE — Telephone Encounter (Addendum)
Called and spoke with Carol Crosby.  Appointment schedule with Dr. Diona Browner 01/16/2014 @ 10:45am.  Advised to bring her BP readings with her.  I ask [name] to call me back if she starts experiencing severe headache or blurred vision other wise we will see her tomorrow.  Patient in agreement with plan.

## 2014-01-16 ENCOUNTER — Encounter: Payer: Self-pay | Admitting: Family Medicine

## 2014-01-16 ENCOUNTER — Ambulatory Visit (INDEPENDENT_AMBULATORY_CARE_PROVIDER_SITE_OTHER): Payer: BC Managed Care – PPO | Admitting: Family Medicine

## 2014-01-16 ENCOUNTER — Telehealth: Payer: Self-pay | Admitting: Family Medicine

## 2014-01-16 VITALS — BP 152/94 | HR 75 | Temp 97.7°F | Ht 62.25 in | Wt 256.2 lb

## 2014-01-16 DIAGNOSIS — I10 Essential (primary) hypertension: Secondary | ICD-10-CM

## 2014-01-16 DIAGNOSIS — R51 Headache: Secondary | ICD-10-CM

## 2014-01-16 NOTE — Progress Notes (Signed)
Pre visit review using our clinic review tool, if applicable. No additional management support is needed unless otherwise documented below in the visit note. 

## 2014-01-16 NOTE — Assessment & Plan Note (Signed)
Likely due to elevated BP.

## 2014-01-16 NOTE — Assessment & Plan Note (Signed)
Discussed bariatric surgery.. Recommended bariatric surg seminar at [place].

## 2014-01-16 NOTE — Patient Instructions (Signed)
Start losartan/HCTZ 1 tab daily. Follow blood pressure at home, goal < 140/90. When BP improved get back on track with exercise, weight loss and healthy eating. Keep follow up appt next week.

## 2014-01-16 NOTE — Assessment & Plan Note (Addendum)
New diagnosis.  Recent labs and UA showed no secondary cause. Risk factor eval nml... No DM and nml chol. EKG today showed: no abnormality Start losartan/HCTZ and follow up as scheduled.

## 2014-01-16 NOTE — Telephone Encounter (Signed)
Patient is scheduled to see Dr. Diona Browner today at 10:45am.

## 2014-01-16 NOTE — Progress Notes (Signed)
   Subjective:    Patient ID: Carol Crosby, female    DOB: August 14, 1961, 53 y.o.   MRN: [id number]  Hypertension Associated symptoms include palpitations. Pertinent negatives include no chest pain or shortness of breath.   53 year old female presents for  follow up.  At last OV 2 weeks ago.Marland Kitchen She was noted to have elevated BP, no history of HTN. Since then BPs have continued to be elevated.   Labs showed:  nml CMET, TSH, cbc, UA and  Great cholesterol control.  She reports BP 135-183/ 82-125.  She as been having dizziness and bad headaches. Ibuprofen does not help. Family history of CVA. No chest pain, occ heart skipping a beat, worse with exertion.  No SOB.  Not having much issue with throat catching.     Review of Systems  Constitutional: Positive for fatigue. Negative for fever.  HENT: Negative for ear pain.   Eyes: Negative for pain.  Respiratory: Negative for chest tightness and shortness of breath.   Cardiovascular: Positive for palpitations and leg swelling. Negative for chest pain.  Gastrointestinal: Negative for abdominal pain.  Genitourinary: Negative for dysuria.       Objective:   Physical Exam  Constitutional: Vital signs are normal. She appears well-developed and well-nourished. She is cooperative.  Non-toxic appearance. She does not appear ill. No distress.  Morbidly obese  HENT:  Head: Normocephalic.  Right Ear: Hearing, tympanic membrane, external ear and ear canal normal. Tympanic membrane is not erythematous, not retracted and not bulging.  Left Ear: Hearing, tympanic membrane, external ear and ear canal normal. Tympanic membrane is not erythematous, not retracted and not bulging.  Nose: No mucosal edema or rhinorrhea. Right sinus exhibits no maxillary sinus tenderness and no frontal sinus tenderness. Left sinus exhibits no maxillary sinus tenderness and no frontal sinus tenderness.  Mouth/Throat: Uvula is midline, oropharynx is clear and moist and mucous  membranes are normal.  Eyes: Conjunctivae, EOM and lids are normal. Pupils are equal, round, and reactive to light. Lids are everted and swept, no foreign bodies found.  Neck: Trachea normal and normal range of motion. Neck supple. Carotid bruit is not present. No mass and no thyromegaly present.  Cardiovascular: Normal rate, regular rhythm, S1 normal, S2 normal, normal heart sounds, intact distal pulses and normal pulses.  Exam reveals no gallop and no friction rub.   No murmur heard. Pulmonary/Chest: Effort normal and breath sounds normal. Not tachypneic. No respiratory distress. She has no decreased breath sounds. She has no wheezes. She has no rhonchi. She has no rales.  Abdominal: Soft. Normal appearance and bowel sounds are normal. There is no tenderness.  Neurological: She is alert.  Skin: Skin is warm, dry and intact. No rash noted.  Psychiatric: Her speech is normal and behavior is normal. Judgment and thought content normal. Her mood appears not anxious. Cognition and memory are normal. She does not exhibit a depressed mood.          Assessment & Plan:

## 2014-01-16 NOTE — Telephone Encounter (Signed)
Relevant patient education assigned to patient using Emmi. ° °

## 2014-01-23 ENCOUNTER — Ambulatory Visit (INDEPENDENT_AMBULATORY_CARE_PROVIDER_SITE_OTHER): Payer: BC Managed Care – PPO | Admitting: Family Medicine

## 2014-01-23 ENCOUNTER — Encounter: Payer: Self-pay | Admitting: Family Medicine

## 2014-01-23 VITALS — BP 132/80 | HR 82 | Temp 97.8°F | Ht 62.25 in | Wt 255.5 lb

## 2014-01-23 DIAGNOSIS — R519 Headache, unspecified: Secondary | ICD-10-CM | POA: Insufficient documentation

## 2014-01-23 DIAGNOSIS — R51 Headache: Secondary | ICD-10-CM

## 2014-01-23 DIAGNOSIS — I10 Essential (primary) hypertension: Secondary | ICD-10-CM

## 2014-01-23 LAB — BASIC METABOLIC PANEL
BUN: 17 mg/dL (ref 6–23)
CHLORIDE: 101 meq/L (ref 96–112)
CO2: 29 meq/L (ref 19–32)
Calcium: 9.3 mg/dL (ref 8.4–10.5)
Creatinine, Ser: 0.8 mg/dL (ref 0.4–1.2)
GFR: 83.65 mL/min (ref >60.00)
GLUCOSE: 103 mg/dL — AB (ref 70–99)
POTASSIUM: 4.3 meq/L (ref 3.5–5.1)
SODIUM: 139 meq/L (ref 135–145)

## 2014-01-23 MED ORDER — KETOROLAC TROMETHAMINE 60 MG/2ML IM SOLN
60.0000 mg | Freq: Once | INTRAMUSCULAR | Status: AC
  Administered 2014-01-23: 60 mg via INTRAMUSCULAR

## 2014-01-23 NOTE — Progress Notes (Signed)
Pre visit review using our clinic review tool, if applicable. No additional management support is needed unless otherwise documented below in the visit note. 

## 2014-01-23 NOTE — Progress Notes (Signed)
Subjective:    Patient ID: Carol Crosby, female    DOB: 20-Feb-1961, 52 y.o.   MRN: [id number]  Hypertension Pertinent negatives include no chest pain or shortness of breath.   53 year old female presents for follow up 2 weeks on HTN.  Hypertension:   BP much improved now on losartan HCTZ. BP Readings from Last 3 Encounters:  01/23/14 132/80  01/16/14 152/94  01/09/14 144/96  No SE.   Using medication without problems or  lightheadedness:None  Chest pain with exertion: none Edema:None Short of breath:None Average home BPs: 106-135/72-79. Other issues:  She does continue to have headache, ongoing several months. Frontal , bilateral. Occ sharp pains on left side. Occuring still daily, constantly. 7-8/10 on pain scale. Tender to touch on left temple. No nausea, sensitive to light, not sound. Mild dizzy at times. No numbness, no tingling, no weakness. No slurred speech, occ intermittent blurred vision.   In past she has taken ibuprofen 800 mg without relief. No worsening or improving, staying the same.  No  fam hx of migraine. Mother with migraine.        Review of Systems  Constitutional: Positive for fatigue. Negative for fever.  HENT: Negative for ear pain.   Eyes: Negative for pain.  Respiratory: Negative for cough and shortness of breath.   Cardiovascular: Negative for chest pain.  Gastrointestinal: Negative for abdominal pain.       Objective:   Physical Exam  Constitutional: She is oriented to person, place, and time. Vital signs are normal. She appears well-developed and well-nourished. She is cooperative.  Non-toxic appearance. She does not appear ill. No distress.  Morbidly obese  HENT:  Head: Normocephalic.  Right Ear: Hearing, tympanic membrane, external ear and ear canal normal. Tympanic membrane is not erythematous, not retracted and not bulging.  Left Ear: Hearing, tympanic membrane, external ear and ear canal normal. Tympanic membrane is not  erythematous, not retracted and not bulging.  Nose: No mucosal edema or rhinorrhea. Right sinus exhibits no maxillary sinus tenderness and no frontal sinus tenderness. Left sinus exhibits no maxillary sinus tenderness and no frontal sinus tenderness.  Mouth/Throat: Uvula is midline, oropharynx is clear and moist and mucous membranes are normal.  Eyes: Conjunctivae, EOM and lids are normal. Pupils are equal, round, and reactive to light. Lids are everted and swept, no foreign bodies found.  Neck: Trachea normal and normal range of motion. Neck supple. Carotid bruit is not present. No mass and no thyromegaly present.  Cardiovascular: Normal rate, regular rhythm, S1 normal, S2 normal, normal heart sounds, intact distal pulses and normal pulses.  Exam reveals no gallop and no friction rub.   No murmur heard. Pulmonary/Chest: Effort normal and breath sounds normal. Not tachypneic. No respiratory distress. She has no decreased breath sounds. She has no wheezes. She has no rhonchi. She has no rales.  Abdominal: Soft. Normal appearance and bowel sounds are normal. There is no tenderness.  Neurological: She is alert and oriented to person, place, and time. She has normal strength and normal reflexes. No cranial nerve deficit or sensory deficit. She exhibits normal muscle tone. She displays a negative Romberg sign. Coordination and gait normal. GCS eye subscore is 4. GCS verbal subscore is 5. GCS motor subscore is 6.  Nml cerebellar exam   No papilledema  Skin: Skin is warm, dry and intact. No rash noted.  Psychiatric: She has a normal mood and affect. Her speech is normal and behavior is normal. Judgment and  thought content normal. Her mood appears not anxious. Cognition and memory are normal. Cognition and memory are not impaired. She does not exhibit a depressed mood. She exhibits normal recent memory and normal remote memory.          Assessment & Plan:

## 2014-01-23 NOTE — Patient Instructions (Addendum)
Stop at lab on way out. Stop at front desk to set up MRI brain. If headache not improving with toradol... Call.

## 2014-01-30 ENCOUNTER — Ambulatory Visit
Admission: RE | Admit: 2014-01-30 | Discharge: 2014-01-30 | Disposition: A | Discharge status: Home / Self Care | Payer: BC Managed Care – PPO | Source: Ambulatory Visit | Attending: Family Medicine | Admitting: Family Medicine

## 2014-01-30 DIAGNOSIS — R519 Headache, unspecified: Secondary | ICD-10-CM

## 2014-01-30 DIAGNOSIS — R51 Headache: Principal | ICD-10-CM

## 2014-02-01 ENCOUNTER — Telehealth: Payer: Self-pay | Admitting: Family Medicine

## 2014-02-01 MED ORDER — SUMATRIPTAN SUCCINATE 100 MG PO TABS: ORAL_TABLET | ORAL | Status: DC

## 2014-02-01 NOTE — Telephone Encounter (Signed)
Message copied by Eliezer Lofts E on Thu Feb 01, 2014  1:05 PM ------      Message from: Carter Kitten      Created: Wed Jan 31, 2014  4:30 PM       Patient notified as instructed by telephone.  Would like to try a migraine medication. Request a generic medication.  Patient uses [facility] on [address]. ------

## 2014-02-01 NOTE — Telephone Encounter (Signed)
Phone or fax in med. Instructions too long.  Have her try migraine med. Have her let me know if effective. If not. Refer to neurologist.

## 2014-02-01 NOTE — Telephone Encounter (Signed)
Patient notified as instructed by telephone. 

## 2014-02-01 NOTE — Telephone Encounter (Addendum)
[employer] called to [employer].  Left message for patient to return my call.

## 2014-04-11 ENCOUNTER — Other Ambulatory Visit: Payer: Self-pay

## 2014-04-11 DIAGNOSIS — Z1231 Encounter for screening mammogram for malignant neoplasm of breast: Secondary | ICD-10-CM

## 2014-04-12 ENCOUNTER — Ambulatory Visit
Admission: RE | Admit: 2014-04-12 | Discharge: 2014-04-12 | Disposition: A | Discharge status: Home / Self Care | Payer: BC Managed Care – PPO | Source: Ambulatory Visit

## 2014-04-12 DIAGNOSIS — Z1231 Encounter for screening mammogram for malignant neoplasm of breast: Secondary | ICD-10-CM

## 2014-11-28 ENCOUNTER — Telehealth: Payer: Self-pay | Admitting: Family Medicine

## 2014-11-28 NOTE — Telephone Encounter (Signed)
Pt has appt with Webb Silversmith 11/29/14 at 3pm.

## 2014-11-28 NOTE — Telephone Encounter (Signed)
Patient Name: Carol Crosby DOB: [birth year] Initial Comment Caller states she has a headache and sore throat. Caller states she is also feeling nauseated. Nurse Assessment Nurse: Vallery Sa, RN, Cathy Date/Time (Eastern Time): 11/28/2014 11:42:36 AM Confirm and document reason for call. If symptomatic, describe symptoms. ---Caller states she developed a headache and sore throat yesterday. No fever. No severe breathing or swallowing difficulty. Exposed to daughter with Strep Throat. Has the patient traveled out of the country within the last 30 days? ---No Does the patient require triage? ---Yes Related visit to physician within the last 2 weeks? ---No Does the PT have any chronic conditions? (i.e. diabetes, asthma, etc.) ---No Did the patient indicate they were pregnant? ---No Guidelines Guideline Title Affirmed Question Affirmed Notes Strep Throat Exposure [1] Sore throat AND [2] strep throat EXPOSURE (i.e., meets definition) within past 10 days Final Disposition User See PCP within 24 Hours. Appointment scheduled for 11/29/14. Vallery Sa, RN, [employer]

## 2014-11-29 ENCOUNTER — Ambulatory Visit (INDEPENDENT_AMBULATORY_CARE_PROVIDER_SITE_OTHER): Payer: BC Managed Care – PPO | Admitting: Internal Medicine

## 2014-11-29 ENCOUNTER — Encounter: Payer: Self-pay | Admitting: Internal Medicine

## 2014-11-29 VITALS — BP 124/86 | HR 78 | Temp 98.2°F | Wt 256.0 lb

## 2014-11-29 DIAGNOSIS — J029 Acute pharyngitis, unspecified: Secondary | ICD-10-CM

## 2014-11-29 MED ORDER — AMOXICILLIN 875 MG PO TABS: 875.0000 mg | ORAL_TABLET | Freq: Two times a day (BID) | ORAL | Status: DC

## 2014-11-29 NOTE — Patient Instructions (Signed)
Sore Throat A sore throat is a painful, burning, sore, or scratchy feeling of the throat. There may be pain or tenderness when swallowing or talking. You may have other symptoms with a sore throat. These include coughing, sneezing, fever, or a swollen neck. A sore throat is often the first sign of another sickness. These sicknesses may include a cold, flu, strep throat, or an infection called mono. Most sore throats go away without medical treatment.  HOME CARE   Only take medicine as told by your doctor.  Drink enough fluids to keep your pee (urine) clear or pale yellow.  Rest as needed.  Try using throat sprays, lozenges, or suck on hard candy (if older than 4 years or as told).  Sip warm liquids, such as broth, herbal tea, or warm water with honey. Try sucking on frozen ice pops or drinking cold liquids.  Rinse the mouth (gargle) with salt water. Mix 1 teaspoon salt with 8 ounces of water.  Do not smoke. Avoid being around others when they are smoking.  Put a humidifier in your bedroom at night to moisten the air. You can also turn on a hot shower and sit in the bathroom for 5-10 minutes. Be sure the bathroom door is closed. GET HELP RIGHT AWAY IF:   You have trouble breathing.  You cannot swallow fluids, soft foods, or your spit (saliva).  You have more puffiness (swelling) in the throat.  Your sore throat does not get better in 7 days.  You feel sick to your stomach (nauseous) and throw up (vomit).  You have a fever or lasting symptoms for more than 2-3 days.  You have a fever and your symptoms suddenly get worse. MAKE SURE YOU:   Understand these instructions.  Will watch your condition.  Will get help right away if you are not doing well or get worse. Document Released: 07/21/2008 Document Revised: 07/06/2012 Document Reviewed: 06/19/2012 [facility] Patient Information 2015 [place], [place]. This information is not intended to replace advice given to you by your health  care provider. Make sure you discuss any questions you have with your health care provider. Sore Throat A sore throat is a painful, burning, sore, or scratchy feeling of the throat. There may be pain or tenderness when swallowing or talking. You may have other symptoms with a sore throat. These include coughing, sneezing, fever, or a swollen neck. A sore throat is often the first sign of another sickness. These sicknesses may include a cold, flu, strep throat, or an infection called mono. Most sore throats go away without medical treatment.  HOME CARE   Only take medicine as told by your doctor.  Drink enough fluids to keep your pee (urine) clear or pale yellow.  Rest as needed.  Try using throat sprays, lozenges, or suck on hard candy (if older than 4 years or as told).  Sip warm liquids, such as broth, herbal tea, or warm water with honey. Try sucking on frozen ice pops or drinking cold liquids.  Rinse the mouth (gargle) with salt water. Mix 1 teaspoon salt with 8 ounces of water.  Do not smoke. Avoid being around others when they are smoking.  Put a humidifier in your bedroom at night to moisten the air. You can also turn on a hot shower and sit in the bathroom for 5-10 minutes. Be sure the bathroom door is closed. GET HELP RIGHT AWAY IF:   You have trouble breathing.  You cannot swallow fluids, soft foods, or  your spit (saliva).  You have more puffiness (swelling) in the throat.  Your sore throat does not get better in 7 days.  You feel sick to your stomach (nauseous) and throw up (vomit).  You have a fever or lasting symptoms for more than 2-3 days.  You have a fever and your symptoms suddenly get worse. MAKE SURE YOU:   Understand these instructions.  Will watch your condition.  Will get help right away if you are not doing well or get worse. Document Released: 07/21/2008 Document Revised: 07/06/2012 Document Reviewed: 06/19/2012 [facility] Patient Information 2015  [place], [place]. This information is not intended to replace advice given to you by your health care provider. Make sure you discuss any questions you have with your health care provider.

## 2014-11-29 NOTE — Progress Notes (Signed)
HPI  Carol Crosby is a 54 yo female presenting with c/o sore throat and headache x 4 days. Throat is scratchy and hurts to swallow. Headache pain is felt in temporal regions. She reports eyes "feel different than normal." Has felt some post nasal drip. No facial pain, ear pain or mouth pain. No cough. Chills but no fevers. Been taking OTC ibuprofen 800 MG twice/day since Sunday. Her daughter was diagnosed with Strep throat on 1/21.  Review of Systems    Past Medical History  Diagnosis Date  . Other chest pain   . Morbid obesity   . Obstructive sleep apnea (adult) (pediatric)   . Hypersomnia, unspecified   . Excessive or frequent menstruation   . Abdominal pain, right upper quadrant   . Acute sinusitis, unspecified   . Streptococcal sore throat   . Mixed incontinence urge and stress (female)(female)   . Unspecified urinary incontinence   . Esophageal reflux   . Temporomandibular joint disorders, unspecified   . Scarlet fever   . Palpitations     Family History  Problem Relation Age of Onset  . Diabetes Father   . Migraines Mother   . Hypertension Mother   . Fibromyalgia Sister   . Colon cancer Maternal Grandmother   . Other Brother     ? Heart issue    History   Social History  . Marital Status: Married    Spouse Name: N/A    Number of Children: N/A  . Years of Education: N/A   Occupational History  . [place] History Main Topics  . Smoking status: Former Smoker    Quit date: 10/26/2001  . Smokeless tobacco: Never Used  . Alcohol Use: No  . Drug Use: No  . Sexual Activity: Not on file   Other Topics Concern  . Not on file   Social History Narrative   No regular exercise      Diet: fruits and veggies, chicken, no water, lots of decaf    Allergies  Allergen Reactions  . Erythromycin Base     REACTION: Nausea and vomiting   Constitutional: Positive headache, fatigue and chills. Denies fever and abrupt weight changes.   HEENT: Positive sore throat and pressure behind the eyes. Denies eye redness, eye pain, facial pain, nasal congestion, ear pain, ringing in the ears, wax buildup, runny nose or bloody nose. Respiratory: Denies cough, difficulty breathing or shortness of breath.  Cardiovascular: Denies chest pain, chest tightness, palpitations or swelling in the hands or feet.   No other specific complaints in a complete review of systems (except as listed in HPI above).  Objective:   BP 124/86 mmHg  Pulse 78  Temp(Src) 98.2 F (36.8 C) (Oral)  Wt 256 lb (116.121 kg)  SpO2 98%  General: Appears her stated age, obese but well developed, well nourished in NAD. HEENT: Head: normal shape and size; Eyes: sclera white, no icterus, conjunctiva pink; Ears: Tm's gray and intact, normal light reflex; Nose: mucosa pink and moist, septum midline; Throat/Mouth: + PND. Mucosa erythematous and moist, no exudate noted, no lesions or ulcerations noted.  Neck: Lymphadenopathy - submandibular and cervical regions; swollen and tender b/l. Neck supple, trachea midline. No masses, lumps or thyromegaly present.  Cardiovascular: Normal rate and rhythm. S1,S2 noted.  No murmur, rubs or gallops noted.  Pulmonary/Chest: Normal effort and positive vesicular breath sounds. No respiratory distress. No wheezes, rales or ronchi noted.      Assessment & Plan:  Sore Throat:  Rapid Strep Test: Negative Do salt water gargles for the sore throat. Give duration of symptoms and contact with known strep throat, will give RX for Amoxicillin 875 MG BID  RTC as needed or if symptoms persist.

## 2014-11-29 NOTE — Progress Notes (Signed)
Pre visit review using our clinic review tool, if applicable. No additional management support is needed unless otherwise documented below in the visit note. 

## 2014-12-04 ENCOUNTER — Ambulatory Visit (INDEPENDENT_AMBULATORY_CARE_PROVIDER_SITE_OTHER): Payer: BC Managed Care – PPO | Admitting: Internal Medicine

## 2014-12-04 ENCOUNTER — Encounter: Payer: Self-pay | Admitting: Internal Medicine

## 2014-12-04 VITALS — BP 140/96 | HR 74 | Temp 97.4°F | Ht 62.25 in | Wt 253.0 lb

## 2014-12-04 DIAGNOSIS — D179 Benign lipomatous neoplasm, unspecified: Secondary | ICD-10-CM

## 2014-12-04 DIAGNOSIS — I10 Essential (primary) hypertension: Secondary | ICD-10-CM

## 2014-12-04 DIAGNOSIS — M25511 Pain in right shoulder: Secondary | ICD-10-CM

## 2014-12-04 NOTE — Progress Notes (Signed)
Subjective:    Patient ID: Carol Crosby, female    DOB: July 24, 1961, 54 y.o.   MRN: [id number]  HPI  Here with right shoulder pain x 3 days, and since started to hurt has noticed a lump to the post aspect, Pt denies chest pain, increased sob or doe, wheezing, orthopnea, PND, increased LE swelling, palpitations, dizziness or syncope. Pain mild, intermittent, right shoudler only, worse to forward elevate and abduct. Pt denies new neurological symptoms such as new headache, or facial or extremity weakness or numbness No neck pain. Past Medical History  Diagnosis Date  . Other chest pain   . Morbid obesity   . Obstructive sleep apnea (adult) (pediatric)   . Hypersomnia, unspecified   . Excessive or frequent menstruation   . Abdominal pain, right upper quadrant   . Acute sinusitis, unspecified   . Streptococcal sore throat   . Mixed incontinence urge and stress (female)(female)   . Unspecified urinary incontinence   . Esophageal reflux   . Temporomandibular joint disorders, unspecified   . Scarlet fever   . Palpitations    Past Surgical History  Procedure Laterality Date  . Temporomandibular joint surgery  1981  . Cholecystectomy    . Tonsillectomy    . Extremity cyst excision  2000    Wrist  . Refractive surgery  1997  . Cesarean section  2005    reports that she quit smoking about 13 years ago. She has never used smokeless tobacco. She reports that she does not drink alcohol or use illicit drugs. family history includes Colon cancer in her maternal grandmother; Diabetes in her father; Fibromyalgia in her sister; Hypertension in her mother; Migraines in her mother; Other in her brother. Allergies  Allergen Reactions  . Erythromycin Base     REACTION: Nausea and vomiting   Current Outpatient Prescriptions on File Prior to Visit  Medication Sig Dispense Refill  . ibuprofen (ADVIL,MOTRIN) 200 MG tablet Take 800 mg by mouth every 6 (six) hours as needed. Patient used this medication  for her knee.    Marland Kitchen losartan-hydrochlorothiazide (HYZAAR) 50-12.5 MG per tablet Take 1 tablet by mouth daily. 30 tablet 11  . SUMAtriptan (IMITREX) 100 MG tablet 1 tab po x severe headache.May repeat in 2 hours if headache persists or recurs.  Max 200 mg in 24 hours. 10 tablet 0   No current facility-administered medications on file prior to visit.   Review of Systems  Constitutional: Negative for unusual diaphoresis or other sweats  HENT: Negative for ringing in ear Eyes: Negative for double vision or worsening visual disturbance.  Respiratory: Negative for choking and stridor.   Gastrointestinal: Negative for vomiting or other signifcant bowel change Genitourinary: Negative for hematuria or decreased urine volume.  Musculoskeletal: Negative for other MSK pain or swelling Skin: Negative for color change and worsening wound.  Neurological: Negative for tremors and numbness other than noted  Psychiatric/Behavioral: Negative for decreased concentration or agitation other than above       Objective:   Physical Exam BP 140/96 mmHg  Pulse 74  Temp(Src) 97.4 F (36.3 C) (Oral)  Ht 5' 2.25" (1.581 m)  Wt 253 lb (114.76 kg)  BMI 45.91 kg/m2 VS noted,  Constitutional: Pt appears well-developed, well-nourished.  HENT: Head: NCAT.  Right Ear: External ear normal.  Left Ear: External ear normal.  Eyes: . Pupils are equal, round, and reactive to light. Conjunctivae and EOM are normal Neck: Normal range of motion. Neck supple.  Cardiovascular: Normal rate  and regular rhythm.   Pulmonary/Chest: Effort normal and breath sounds without rales or wheezing.  Neurological: Pt is alert. Not confused , motor grossly intact Skin: Skin is warm. No rash, + 2 cm post right shoudler lipoma mass, NT  Right shoulder with tender deltoid region (? Subacromial) without swelling or rash, forward elevate and abduct to 90 degrees only Psychiatric: Pt behavior is normal. No agitation.     Assessment & Plan:

## 2014-12-04 NOTE — Patient Instructions (Signed)
Please continue all other medications as before, and refills have been done if requested.  Please have the pharmacy call with any other refills you may need.  Please keep your appointments with your specialists as you may have planned  You will be contacted regarding the referral for: Dr Tamala Julian - sports medicine

## 2014-12-04 NOTE — Progress Notes (Signed)
Pre visit review using our clinic review tool, if applicable. No additional management support is needed unless otherwise documented below in the visit note. 

## 2014-12-10 DIAGNOSIS — M25511 Pain in right shoulder: Secondary | ICD-10-CM | POA: Insufficient documentation

## 2014-12-10 DIAGNOSIS — D179 Benign lipomatous neoplasm, unspecified: Secondary | ICD-10-CM | POA: Insufficient documentation

## 2014-12-10 NOTE — Assessment & Plan Note (Signed)
stable overall by history and exam, recent data reviewed with pt, and pt to continue medical treatment as before,  to f/u any worsening symptoms or concerns BP Readings from Last 3 Encounters:  12/04/14 140/96  11/29/14 124/86  01/23/14 132/80

## 2014-12-10 NOTE — Assessment & Plan Note (Signed)
?   Bursitis vs rot cuff vs other, for pain control, also refer Dr Smith/sport medicine for furhter eval, hold on films at this time per pt preference

## 2014-12-10 NOTE — Assessment & Plan Note (Signed)
D/w benign nature of finding, OK to follow, to gen surgury if enlarging

## 2014-12-24 ENCOUNTER — Ambulatory Visit: Payer: BC Managed Care – PPO | Admitting: Family Medicine

## 2015-05-08 ENCOUNTER — Other Ambulatory Visit: Payer: Self-pay

## 2015-05-08 DIAGNOSIS — Z1231 Encounter for screening mammogram for malignant neoplasm of breast: Secondary | ICD-10-CM

## 2015-05-10 ENCOUNTER — Ambulatory Visit
Admission: RE | Admit: 2015-05-10 | Discharge: 2015-05-10 | Disposition: A | Discharge status: Home / Self Care | Payer: BC Managed Care – PPO | Source: Ambulatory Visit

## 2015-05-10 DIAGNOSIS — Z1231 Encounter for screening mammogram for malignant neoplasm of breast: Secondary | ICD-10-CM

## 2015-07-05 ENCOUNTER — Emergency Department (HOSPITAL_COMMUNITY)
Admission: EM | Admit: 2015-07-05 | Discharge: 2015-07-06 | Disposition: A | Discharge status: Home / Self Care | Payer: BC Managed Care – PPO | Attending: Emergency Medicine | Admitting: Emergency Medicine

## 2015-07-05 ENCOUNTER — Encounter (HOSPITAL_COMMUNITY): Payer: Self-pay | Admitting: Emergency Medicine

## 2015-07-05 DIAGNOSIS — Z8709 Personal history of other diseases of the respiratory system: Secondary | ICD-10-CM | POA: Diagnosis not present

## 2015-07-05 DIAGNOSIS — M1610 Unilateral primary osteoarthritis, unspecified hip: Secondary | ICD-10-CM | POA: Diagnosis not present

## 2015-07-05 DIAGNOSIS — Y929 Unspecified place or not applicable: Secondary | ICD-10-CM | POA: Insufficient documentation

## 2015-07-05 DIAGNOSIS — S79912A Unspecified injury of left hip, initial encounter: Secondary | ICD-10-CM | POA: Diagnosis present

## 2015-07-05 DIAGNOSIS — S76012A Strain of muscle, fascia and tendon of left hip, initial encounter: Secondary | ICD-10-CM

## 2015-07-05 DIAGNOSIS — X58XXXA Exposure to other specified factors, initial encounter: Secondary | ICD-10-CM | POA: Diagnosis not present

## 2015-07-05 DIAGNOSIS — Z8719 Personal history of other diseases of the digestive system: Secondary | ICD-10-CM | POA: Insufficient documentation

## 2015-07-05 DIAGNOSIS — Y939 Activity, unspecified: Secondary | ICD-10-CM | POA: Diagnosis not present

## 2015-07-05 DIAGNOSIS — Z8619 Personal history of other infectious and parasitic diseases: Secondary | ICD-10-CM | POA: Insufficient documentation

## 2015-07-05 DIAGNOSIS — Z8669 Personal history of other diseases of the nervous system and sense organs: Secondary | ICD-10-CM | POA: Insufficient documentation

## 2015-07-05 DIAGNOSIS — Y999 Unspecified external cause status: Secondary | ICD-10-CM | POA: Diagnosis not present

## 2015-07-05 DIAGNOSIS — Z87891 Personal history of nicotine dependence: Secondary | ICD-10-CM | POA: Insufficient documentation

## 2015-07-05 DIAGNOSIS — M169 Osteoarthritis of hip, unspecified: Secondary | ICD-10-CM

## 2015-07-05 DIAGNOSIS — M25552 Pain in left hip: Secondary | ICD-10-CM

## 2015-07-05 MED ORDER — IBUPROFEN 200 MG PO TABS
600.0000 mg | ORAL_TABLET | Freq: Once | ORAL | Status: AC
  Administered 2015-07-06: 600 mg via ORAL
  Filled 2015-07-05: qty 3

## 2015-07-05 MED ORDER — HYDROCODONE-ACETAMINOPHEN 5-325 MG PO TABS
1.0000 | ORAL_TABLET | Freq: Once | ORAL | Status: AC
  Administered 2015-07-06: 1 via ORAL
  Filled 2015-07-05: qty 1

## 2015-07-05 NOTE — ED Notes (Signed)
Pt. reports left hip pain onset this week denies fall or injury pain increases with with movement and changing positions , denies dysuria / no fever or chills.

## 2015-07-05 NOTE — ED Provider Notes (Signed)
CSN: [id number]     Arrival date & time 07/05/15  1911 History  This chart was scribed for Varney Biles, MD by Randa Evens, ED Scribe. This patient was seen in room A03C/A03C and the patient's care was started at 11:10 PM.     Chief Complaint  Patient presents with  . Hip Pain   The history is provided by the patient. No language interpreter was used.   HPI Comments: Carol Crosby is a 54 y.o. female with PMHx listed below who presents to the Emergency Department complaining of left hip pain onset 1 week prior. She states that the pain is worse with position changes and movement. Pt denies fall or injury. Pt reports previous hip pain that was similar but states that it usually doesn't last this long. Pt states that she has tried ibuprofen and OTC pain medication with no relief. Pt doesn't report any other symptoms. Denies Hx of tobacco use.   Past Medical History  Diagnosis Date  . Other chest pain   . Morbid obesity   . Obstructive sleep apnea (adult) (pediatric)   . Hypersomnia, unspecified   . Excessive or frequent menstruation   . Abdominal pain, right upper quadrant   . Acute sinusitis, unspecified   . Streptococcal sore throat   . Mixed incontinence urge and stress (female)(female)   . Unspecified urinary incontinence   . Esophageal reflux   . Temporomandibular joint disorders, unspecified   . Scarlet fever   . Palpitations    Past Surgical History  Procedure Laterality Date  . Temporomandibular joint surgery  1981  . Cholecystectomy    . Tonsillectomy    . Extremity cyst excision  2000    Wrist  . Refractive surgery  1997  . Cesarean section  2005   Family History  Problem Relation Age of Onset  . Diabetes Father   . Migraines Mother   . Hypertension Mother   . Fibromyalgia Sister   . Colon cancer Maternal Grandmother   . Other Brother     ? Heart issue   Social History  Substance Use Topics  . Smoking status: Former Smoker    Quit date: 10/26/2001  .  Smokeless tobacco: Never Used  . Alcohol Use: No   OB History    No data available     Review of Systems  Constitutional: Positive for activity change.  Respiratory: Negative for shortness of breath.   Cardiovascular: Negative for chest pain.  Gastrointestinal: Negative for nausea, vomiting and abdominal pain.  Genitourinary: Negative for dysuria.  Musculoskeletal: Positive for myalgias and arthralgias. Negative for neck pain.  Neurological: Negative for headaches.       Allergies  Erythromycin base  Home Medications   Prior to Admission medications   Medication Sig Start Date End Date Taking? Authorizing Provider  ibuprofen (ADVIL,MOTRIN) 200 MG tablet Take 800 mg by mouth every 6 (six) hours as needed. Patient used this medication for her knee.    Historical Provider, MD  losartan-hydrochlorothiazide (HYZAAR) 50-12.5 MG per tablet Take 1 tablet by mouth daily. Patient not taking: Reported on 07/05/2015 01/15/14   Jinny Sanders, MD  SUMAtriptan (IMITREX) 100 MG tablet 1 tab po x severe headache.May repeat in 2 hours if headache persists or recurs.  Max 200 mg in 24 hours. Patient not taking: Reported on 07/05/2015 02/01/14   Amy E Diona Browner, MD   BP 135/80 mmHg  Pulse 81  Temp(Src) 97.6 F (36.4 C) (Oral)  Resp 18  Ht  5\' 2"  (1.575 m)  Wt 261 lb 8 oz (118.616 kg)  BMI 47.82 kg/m2  SpO2 96%   Physical Exam  Constitutional: She appears well-developed and well-nourished.  HENT:  Head: Normocephalic and atraumatic.  Eyes: Conjunctivae are normal. Right eye exhibits no discharge. Left eye exhibits no discharge.  Cardiovascular: Normal rate, regular rhythm and normal heart sounds.  Exam reveals no gallop and no friction rub.   No murmur heard. Pulmonary/Chest: Effort normal. No respiratory distress.  Abdominal: Soft.  Musculoskeletal: She exhibits tenderness.  No tenderness over cervical, thoracic or lumbar spine. No flank tenderness. Mild tenderness with palpation over lateral  hip region. Tenderness over the palpation of the lateral femur. Tenderness with active and passive left leg straight raise. No significant tenderness with internal and external rotation of the hip. Tenderness with flexing of the left hip flexors. Tenderness worse with pt sitting compared when lying supine.   Neurological: She is alert. Coordination normal.  Skin: Skin is warm and dry. No rash noted. She is not diaphoretic. No erythema.  Psychiatric: She has a normal mood and affect.  Nursing note and vitals reviewed.   ED Course  Procedures (including critical care time) DIAGNOSTIC STUDIES: Oxygen Saturation is 91% on RA, low by my interpretation.    COORDINATION OF CARE: 11:31 PM-Discussed treatment plan with pt at bedside and pt agreed to plan.     Labs Review Labs Reviewed - No data to display  Imaging Review No results found.    EKG Interpretation None      MDM   Final diagnoses:  Hip tenderness, left  Strain of hip flexor, left, initial encounter    I personally performed the services described in this documentation, which was scribed in my presence. The recorded information has been reviewed and is accurate.  Pt comes in with L hip pain. Hip pain worse with hip flexion, passive and active. The hip rotation is normal. No trauma. Hxo f hip issues, which have resolved on their own in the past. Ddx: Hip arthritis Bursitis Strain / sprain of hip flexors.  Will get xrays and she will benefit from sports med f/u.     Varney Biles, MD 07/08/15 [phone]

## 2015-07-06 ENCOUNTER — Emergency Department (HOSPITAL_COMMUNITY): Payer: BC Managed Care – PPO

## 2015-07-06 MED ORDER — IBUPROFEN 600 MG PO TABS: 600.0000 mg | ORAL_TABLET | Freq: Four times a day (QID) | ORAL | Status: DC | PRN

## 2015-07-06 MED ORDER — TRAMADOL HCL 50 MG PO TABS: 50.0000 mg | ORAL_TABLET | Freq: Two times a day (BID) | ORAL | Status: DC | PRN

## 2015-07-06 MED ORDER — IBUPROFEN 200 MG PO TABS
200.0000 mg | ORAL_TABLET | Freq: Once | ORAL | Status: AC
  Administered 2015-07-06: 200 mg via ORAL
  Filled 2015-07-06: qty 1

## 2015-07-06 NOTE — Discharge Instructions (Signed)
Your xrays certainly show hip arthritis. There is a possibility that the pain might also be due to the tendons inserting in your hip area. It is best that you see the orthopedic doctor for further evaluation.  Use RICE treatment as below   Arthritis, Nonspecific Arthritis is inflammation of a joint. This usually means pain, redness, warmth or swelling are present. One or more joints may be involved. There are a number of types of arthritis. Your caregiver may not be able to tell what type of arthritis you have right away. CAUSES  The most common cause of arthritis is the wear and tear on the joint (osteoarthritis). This causes damage to the cartilage, which can break down over time. The knees, hips, back and neck are most often affected by this type of arthritis. Other types of arthritis and common causes of joint pain include:  Sprains and other injuries near the joint. Sometimes minor sprains and injuries cause pain and swelling that develop hours later.  Rheumatoid arthritis. This affects hands, feet and knees. It usually affects both sides of your body at the same time. It is often associated with chronic ailments, fever, weight loss and general weakness.  Crystal arthritis. Gout and pseudo gout can cause occasional acute severe pain, redness and swelling in the foot, ankle, or knee.  Infectious arthritis. Bacteria can get into a joint through a break in overlying skin. This can cause infection of the joint. Bacteria and viruses can also spread through the blood and affect your joints.  Drug, infectious and allergy reactions. Sometimes joints can become mildly painful and slightly swollen with these types of illnesses. SYMPTOMS   Pain is the main symptom.  Your joint or joints can also be red, swollen and warm or hot to the touch.  You may have a fever with certain types of arthritis, or even feel overall ill.  The joint with arthritis will hurt with movement. Stiffness is present  with some types of arthritis. DIAGNOSIS  Your caregiver will suspect arthritis based on your description of your symptoms and on your exam. Testing may be needed to find the type of arthritis:  Blood and sometimes urine tests.  X-ray tests and sometimes CT or MRI scans.  Removal of fluid from the joint (arthrocentesis) is done to check for bacteria, crystals or other causes. Your caregiver (or a specialist) will numb the area over the joint with a local anesthetic, and use a needle to remove joint fluid for examination. This procedure is only minimally uncomfortable.  Even with these tests, your caregiver may not be able to tell what kind of arthritis you have. Consultation with a specialist (rheumatologist) may be helpful. TREATMENT  Your caregiver will discuss with you treatment specific to your type of arthritis. If the specific type cannot be determined, then the following general recommendations may apply. Treatment of severe joint pain includes:  Rest.  Elevation.  Anti-inflammatory medication (for example, ibuprofen) may be prescribed. Avoiding activities that cause increased pain.  Only take over-the-counter or prescription medicines for pain and discomfort as recommended by your caregiver.  Cold packs over an inflamed joint may be used for 10 to 15 minutes every hour. Hot packs sometimes feel better, but do not use overnight. Do not use hot packs if you are diabetic without your caregiver's permission.  A cortisone shot into arthritic joints may help reduce pain and swelling.  Any acute arthritis that gets worse over the next 1 to 2 days needs to be  looked at to be sure there is no joint infection. Long-term arthritis treatment involves modifying activities and lifestyle to reduce joint stress jarring. This can include weight loss. Also, exercise is needed to nourish the joint cartilage and remove waste. This helps keep the muscles around the joint strong. HOME CARE INSTRUCTIONS    Do not take aspirin to relieve pain if gout is suspected. This elevates uric acid levels.  Only take over-the-counter or prescription medicines for pain, discomfort or fever as directed by your caregiver.  Rest the joint as much as possible.  If your joint is swollen, keep it elevated.  Use crutches if the painful joint is in your leg.  Drinking plenty of fluids may help for certain types of arthritis.  Follow your caregiver's dietary instructions.  Try low-impact exercise such as:  Swimming.  Water aerobics.  Biking.  Walking.  Morning stiffness is often relieved by a warm shower.  Put your joints through regular range-of-motion. SEEK MEDICAL CARE IF:   You do not feel better in 24 hours or are getting worse.  You have side effects to medications, or are not getting better with treatment. SEEK IMMEDIATE MEDICAL CARE IF:   You have a fever.  You develop severe joint pain, swelling or redness.  Many joints are involved and become painful and swollen.  There is severe back pain and/or leg weakness.  You have loss of bowel or bladder control. Document Released: 11/19/2004 Document Revised: 01/04/2012 Document Reviewed: 12/05/2008 [facility] Patient Information 2015 [place], [place]. This information is not intended to replace advice given to you by your health care provider. Make sure you discuss any questions you have with your health care provider. Elastic Bandage and RICE Elastic bandages come in different shapes and sizes. They perform different functions. Your caregiver will help you to decide what is best for your protection, recovery, or rehabilitation following an injury. The following are some general tips to help you use an elastic bandage.  Use the bandage as directed by the maker of the bandage you are using.  Do not wrap it too tight. This may cut off the circulation of the arm or leg below the bandage.  If part of your body beyond the bandage becomes  blue, numb, or swollen, it is too tight. Loosen the bandage as needed to prevent these problems.  See your caregiver or trainer if the bandage seems to be making your problems worse rather than better. Bandages may be a reminder to you that you have an injury. However, they provide very little support. The few pounds of support they provide are minor considering the pressure it takes to injure a joint or tear ligaments. Therefore, the joint will not be able to handle all of the wear and tear it could before the injury. The routine care of many injuries includes Rest, Ice, Compression, and Elevation (RICE).  Rest is required to allow your body to heal. Generally, routine activities can be resumed when comfortable. Injured tendons and bones take about 6 weeks to heal.  Icing the injury helps keep the swelling down and reduces pain. Do not apply ice directly to the skin. Put ice in a plastic bag. Place a towel between the skin and the bag. This will prevent frostbite to the skin. Apply ice bags to the injured area for 15-20 minutes, every 2 hours while awake. Do this for the first 24 to 48 hours, then as directed by your caregiver.  Compression helps keep swelling down, gives  support, and helps with discomfort. If an elastic bandage has been applied today, it should be removed and reapplied every 3 to 4 hours. It should not be applied tightly, but firmly enough to keep swelling down. Watch fingers or toes for swelling, bluish discoloration, coldness, numbness, or increased pain. If any of these problems occur, remove the bandage and reapply it more loosely. If these problems persist, contact your caregiver.  Elevation helps reduce swelling and decreases pain. The injured area (arms, hands, legs, or feet) should be placed near to or above the heart (center of the chest) if able. Persistent pain and inability to use the injured area for more than 2 to 3 days are warning signs. You should see a caregiver for  a follow-up visit as soon as possible. Initially, a minor broken bone (hairline fracture) may not be seen on X-rays. It may take 7 to 10 days to finally show up. Continued pain and swelling show that further evaluation and/or X-rays are needed. Make a follow-up visit with your caregiver. A specialist in reading X-rays (radiologist) will read your X-rays again. Finding out the results of your test Not all test results are available during your visit. If your test results are not back during the visit, make an appointment with your caregiver to find out the results. Do not assume everything is normal if you have not heard from your caregiver or the medical facility. It is important for you to follow up on all of your test results. Document Released: 04/03/2002 Document Revised: 01/04/2012 Document Reviewed: 02/13/2008 [facility] Patient Information 2015 [place], [place]. This information is not intended to replace advice given to you by your health care provider. Make sure you discuss any questions you have with your health care provider.

## 2015-07-15 ENCOUNTER — Encounter: Payer: Self-pay | Admitting: Family Medicine

## 2015-07-15 ENCOUNTER — Ambulatory Visit (INDEPENDENT_AMBULATORY_CARE_PROVIDER_SITE_OTHER): Payer: BC Managed Care – PPO | Admitting: Family Medicine

## 2015-07-15 VITALS — BP 126/84 | HR 69 | Temp 97.6°F | Ht 62.0 in | Wt 259.0 lb

## 2015-07-15 DIAGNOSIS — J209 Acute bronchitis, unspecified: Secondary | ICD-10-CM

## 2015-07-15 DIAGNOSIS — R05 Cough: Secondary | ICD-10-CM

## 2015-07-15 DIAGNOSIS — R059 Cough, unspecified: Secondary | ICD-10-CM

## 2015-07-15 MED ORDER — DOXYCYCLINE HYCLATE 100 MG PO TABS: 100.0000 mg | ORAL_TABLET | Freq: Two times a day (BID) | ORAL | Status: DC

## 2015-07-15 MED ORDER — HYDROCODONE-HOMATROPINE 5-1.5 MG/5ML PO SYRP: ORAL_SOLUTION | ORAL | Status: DC

## 2015-07-15 NOTE — Progress Notes (Signed)
Pre visit review using our clinic review tool, if applicable. No additional management support is needed unless otherwise documented below in the visit note. 

## 2015-07-15 NOTE — Progress Notes (Signed)
Dr. Frederico Hamman T. Copland, MD, [facility] [place], [address] Phone: [phone] Fax: [phone]  07/15/2015  Patient: Carol Crosby, MRN: [id number], DOB: 12/04/1960, 54 y.o.  Primary Physician:  Eliezer Lofts, MD  Chief Complaint: Cough  Subjective:   Carol Crosby is a 54 y.o. very pleasant female patient who presents with the following:  Coughing, scratchy throat x 1 week.  No smoker  Pleasant patient who presents with primary pulmonary complaints today was been sick for greater than 7 days.  She has been worsening in the last few days, she has a productive cough that is productive of sputum.  She is overall a relatively healthy patient with some obstructive sleep apnea and morbid obesity.  She denies  A history of smoking.  She has a very significant cough is keeping her awake at night most of the time.  Past Medical History, Surgical History, Social History, Family History, Problem List, Medications, and Allergies have been reviewed and updated if relevant.  Patient Active Problem List   Diagnosis Date Noted  . Lipoma 12/10/2014  . Right shoulder pain 12/10/2014  . Severe headache 01/23/2014  . HTN (hypertension), benign 01/09/2014  . Right knee pain 01/09/2014  . Throat irritation 01/09/2014  . OBSTRUCTIVE SLEEP APNEA 01/14/2009  . Morbid obesity 11/30/2007  . INCONTINENCE, MIXED, URGE/STRESS 11/30/2007    Past Medical History  Diagnosis Date  . Other chest pain   . Morbid obesity   . Obstructive sleep apnea (adult) (pediatric)   . Hypersomnia, unspecified   . Excessive or frequent menstruation   . Abdominal pain, right upper quadrant   . Acute sinusitis, unspecified   . Streptococcal sore throat   . Mixed incontinence urge and stress (female)(female)   . Unspecified urinary incontinence   . Esophageal reflux   . Temporomandibular joint disorders, unspecified   . Scarlet fever   . Palpitations      Past Surgical History  Procedure Laterality Date  . Temporomandibular joint surgery  1981  . Cholecystectomy    . Tonsillectomy    . Extremity cyst excision  2000    Wrist  . Refractive surgery  1997  . Cesarean section  2005    Social History   Social History  . Marital Status: Married    Spouse Name: N/A  . Number of Children: N/A  . Years of Education: N/A   Occupational History  . [place] History Main Topics  . Smoking status: Former Smoker    Quit date: 10/26/2001  . Smokeless tobacco: Never Used  . Alcohol Use: No  . Drug Use: No  . Sexual Activity: Not on file   Other Topics Concern  . Not on file   Social History Narrative   No regular exercise      Diet: fruits and veggies, chicken, no water, lots of decaf    Family History  Problem Relation Age of Onset  . Diabetes Father   . Migraines Mother   . Hypertension Mother   . Fibromyalgia Sister   . Colon cancer Maternal Grandmother   . Other Brother     ? Heart issue    Allergies  Allergen Reactions  . Erythromycin Base     REACTION: Nausea and vomiting    Medication list reviewed and updated in full in [place].  ROS: GEN: Acute illness details above GI: Tolerating PO intake GU:  maintaining adequate hydration and urination Pulm: No SOB Interactive and getting along well at home.  Otherwise, ROS is as per the HPI.   Objective:   BP 126/84 mmHg  Pulse 69  Temp(Src) 97.6 F (36.4 C) (Oral)  Ht 5\' 2"  (1.575 m)  Wt 259 lb (117.482 kg)  BMI 47.36 kg/m2  SpO2 95%   GEN: A and O x 3. WDWN. NAD.    ENT: Nose clear, ext NML.  No LAD.  No JVD.  TM's clear. Oropharynx clear.  PULM: Normal WOB, no distress. No crackles, wheezes, and rare rhonchi. CV: RRR, no M/G/R, No rubs, No JVD.   EXT: warm and well-perfused, No c/c/e. PSYCH: Pleasant and conversant.    Laboratory and Imaging Data:  Assessment and Plan:   Acute bronchitis,  unspecified organism  Cough   > 7 days, viral bronchitis vs atypicals, worsening Sx care for cough  Follow-up: No Follow-up on file.  New Prescriptions   DOXYCYCLINE (VIBRA-TABS) 100 MG TABLET    Take 1 tablet (100 mg total) by mouth 2 (two) times daily.   HYDROCODONE-HOMATROPINE (HYCODAN) 5-1.5 MG/5ML SYRUP    1 tsp po at night before bed prn cough   No orders of the defined types were placed in this encounter.    Signed,  Maud Deed. Copland, MD   Patient's Medications  New Prescriptions   DOXYCYCLINE (VIBRA-TABS) 100 MG TABLET    Take 1 tablet (100 mg total) by mouth 2 (two) times daily.   HYDROCODONE-HOMATROPINE (HYCODAN) 5-1.5 MG/5ML SYRUP    1 tsp po at night before bed prn cough  Previous Medications   IBUPROFEN (ADVIL,MOTRIN) 600 MG TABLET    Take 1 tablet (600 mg total) by mouth every 6 (six) hours as needed.   TRAMADOL (ULTRAM) 50 MG TABLET    Take 1 tablet (50 mg total) by mouth every 12 (twelve) hours as needed for severe pain.  Modified Medications   No medications on file  Discontinued Medications   LOSARTAN-HYDROCHLOROTHIAZIDE (HYZAAR) 50-12.5 MG PER TABLET    Take 1 tablet by mouth daily.   SUMATRIPTAN (IMITREX) 100 MG TABLET    1 tab po x severe headache.May repeat in 2 hours if headache persists or recurs.  Max 200 mg in 24 hours.

## 2015-07-25 ENCOUNTER — Encounter: Payer: Self-pay | Admitting: Family Medicine

## 2015-08-21 ENCOUNTER — Encounter: Payer: Self-pay | Admitting: Family Medicine

## 2015-08-23 ENCOUNTER — Other Ambulatory Visit: Payer: Self-pay | Admitting: Family Medicine

## 2015-08-23 MED ORDER — DICLOFENAC SODIUM 75 MG PO TBEC: 75.0000 mg | DELAYED_RELEASE_TABLET | Freq: Two times a day (BID) | ORAL | Status: DC

## 2015-09-27 ENCOUNTER — Telehealth: Payer: Self-pay | Admitting: Family Medicine

## 2015-09-28 NOTE — Telephone Encounter (Signed)
Last office visit 07/15/2015.  Last refilled 08/23/2015 for #30 with no refills.  I changed quantity to #60 since it is a twice a day medication.  Ok to refill?

## 2015-10-01 NOTE — Telephone Encounter (Addendum)
Pt left v/m; pt got the diclofenac but pt wants to know if Dr Diona Browner would send in refills also so each month pt will not have to request refills. Pt request [email or web address].

## 2015-10-01 NOTE — Telephone Encounter (Signed)
This is not great med ( any NSAID given increased risk of CAD and stomach ulcers with longterm use) to be on long term unless absolutely necessary. If she is having joint pain that is not getting better.. Maybe we need to addressed it differently twith [facility] referral etc. If she has already done this and there is no other med treatment recommended.Marland Kitchen Refill x 2.

## 2015-10-02 NOTE — Telephone Encounter (Signed)
Left message for Carol Crosby to return my call. 

## 2015-10-02 NOTE — Telephone Encounter (Signed)
Carol Crosby notified as instructed by telephone.  She has seen an orthopedist in the past for knee surgery, so she may try and call them to schedule an office visit.  Will call us back if they say she needs a referral.

## 2015-10-02 NOTE — Telephone Encounter (Signed)
Pt called back. She said you can reach her at work [phone] if it is before 3pm. Otherwise you can call her cell phone

## 2015-11-07 ENCOUNTER — Encounter: Payer: Self-pay | Admitting: Family Medicine

## 2015-11-07 ENCOUNTER — Other Ambulatory Visit: Payer: Self-pay | Admitting: Family Medicine

## 2015-11-07 MED ORDER — TRAZODONE HCL 50 MG PO TABS: 25.0000 mg | ORAL_TABLET | Freq: Every evening | ORAL | Status: DC | PRN

## 2015-11-18 ENCOUNTER — Encounter: Payer: Self-pay | Admitting: Family Medicine

## 2015-11-18 ENCOUNTER — Ambulatory Visit (INDEPENDENT_AMBULATORY_CARE_PROVIDER_SITE_OTHER): Payer: BC Managed Care – PPO | Admitting: Family Medicine

## 2015-11-18 VITALS — BP 130/70 | HR 96 | Temp 98.5°F | Ht 62.0 in | Wt 258.5 lb

## 2015-11-18 DIAGNOSIS — R509 Fever, unspecified: Secondary | ICD-10-CM

## 2015-11-18 DIAGNOSIS — R6889 Other general symptoms and signs: Secondary | ICD-10-CM | POA: Diagnosis not present

## 2015-11-18 LAB — POCT INFLUENZA A/B
Influenza A, POC: NEGATIVE
Influenza B, POC: NEGATIVE

## 2015-11-18 NOTE — Progress Notes (Signed)
Dr. Frederico Hamman T. Aryanne Gilleland, MD, [facility] Medicine [place], [address] Phone: [phone] Fax: [phone]  11/18/2015  Patient: Carol Crosby, MRN: [id number], DOB: December 14, 1960, 55 y.o.  Primary Physician:  Eliezer Lofts, MD   Chief Complaint  Patient presents with  . Generalized Body Aches  . Fever  . Chest Pain    Tightness  . Sore Throat  . Nasal Congestion   Subjective:   Carol Crosby presents with runny nose, sneezing, cough, sore throat, malaise, myalgias, arthralgia, chills, and fever. Feel horrible all over. Fever to 101.  Aching and hurting all over.  Not much coughing.  Ear and throat hurts.  Started feeling bad Saturday.   + recent exposure to others with similar symptoms.   The patent denies sore throat as the primary complaint. Denies sthortness of breath/wheezing, otalgia, facial pain, abdominal pain, changes in bowel or bladder.  Generally feels terrible  Tmax: 101-102  PMH, PHS, Allergies, Problem List, Medications, Family History, and Social History have all been reviewed.  Patient Active Problem List   Diagnosis Date Noted  . Lipoma 12/10/2014  . Right shoulder pain 12/10/2014  . Severe headache 01/23/2014  . HTN (hypertension), benign 01/09/2014  . Right knee pain 01/09/2014  . Throat irritation 01/09/2014  . OBSTRUCTIVE SLEEP APNEA 01/14/2009  . Morbid obesity ([place]) 11/30/2007  . INCONTINENCE, MIXED, URGE/STRESS 11/30/2007    Past Medical History  Diagnosis Date  . Other chest pain   . Morbid obesity ([place])   . Obstructive sleep apnea (adult) (pediatric)   . Hypersomnia, unspecified   . Excessive or frequent menstruation   . Abdominal pain, right upper quadrant   . Acute sinusitis, unspecified   . Streptococcal sore throat   . Mixed incontinence urge and stress (female)(female)   . Unspecified urinary incontinence   . Esophageal reflux   . Temporomandibular joint disorders, unspecified    . Scarlet fever   . Palpitations     Past Surgical History  Procedure Laterality Date  . Temporomandibular joint surgery  1981  . Cholecystectomy    . Tonsillectomy    . Extremity cyst excision  2000    Wrist  . Refractive surgery  1997  . Cesarean section  2005    Social History   Social History  . Marital Status: Married    Spouse Name: N/A  . Number of Children: N/A  . Years of Education: N/A   Occupational History  . [place] History Main Topics  . Smoking status: Former Smoker    Quit date: 10/26/2001  . Smokeless tobacco: Never Used  . Alcohol Use: No  . Drug Use: No  . Sexual Activity: Not on file   Other Topics Concern  . Not on file   Social History Narrative   No regular exercise      Diet: fruits and veggies, chicken, no water, lots of decaf    Family History  Problem Relation Age of Onset  . Diabetes Father   . Migraines Mother   . Hypertension Mother   . Fibromyalgia Sister   . Colon cancer Maternal Grandmother   . Other Brother     ? Heart issue    Allergies  Allergen Reactions  . Erythromycin Base     REACTION: Nausea and vomiting    Medication list reviewed and updated in full in [place].  ROS as above, eating and  drinking - tolerating PO. Urinating normally. No excessive vomitting or diarrhea. O/w as above.  Objective:   Blood pressure 130/70, pulse 96, temperature 98.5 F (36.9 C), temperature source Oral, height 5\' 2"  (1.575 m), weight 258 lb 8 oz (117.255 kg).  Gen: WDWN, NAD; A & O x3, cooperative. Pleasant.Globally Non-toxic HEENT: Normocephalic and atraumatic. Throat clear, w/o exudate, R TM clear, L TM - good landmarks, No fluid present. rhinnorhea. No frontal or maxillary sinus T. MMM NECK: Anterior cervical  LAD is absent CV: RRR, No M/G/R, cap refill <2 sec PULM: Breathing comfortably in no respiratory distress. no wheezing, crackles, rhonchi ABD: S,NT,ND,+BS. No HSM.  No rebound. EXT: No c/c/e PSYCH: Friendly, good eye contact MSK: Nml gait  Results for orders placed or performed in visit on 11/18/15  POCT Influenza A/B  Result Value Ref Range   Influenza A, POC Negative Negative   Influenza B, POC Negative Negative    Assessment and Plan:   Flu-like symptoms  Other specified fever - Plan: POCT Influenza A/B   Supportive care, fluids, cough medicines as needed, and anti-pyretics. Infection control emphasized, including OOW or school until AF 24 hours.  May have flu with neg test - offered tamiflu, but did not want due to finances  Follow-up: No Follow-up on file.  New Prescriptions   No medications on file   Modified Medications   No medications on file   Orders Placed This Encounter  Procedures  . POCT Influenza A/B    Signed,  Zaiden Ludlum T. Kianna Billet, MD   Patient's Medications  New Prescriptions   No medications on file  Previous Medications   DICLOFENAC (VOLTAREN) 75 MG EC TABLET    TAKE ONE TABLET BY MOUTH TWICE DAILY   TRAZODONE (DESYREL) 50 MG TABLET    Take 0.5-1 tablets (25-50 mg total) by mouth at bedtime as needed for sleep.  Modified Medications   No medications on file  Discontinued Medications   DOXYCYCLINE (VIBRA-TABS) 100 MG TABLET    Take 1 tablet (100 mg total) by mouth 2 (two) times daily.   HYDROCODONE-HOMATROPINE (HYCODAN) 5-1.5 MG/5ML SYRUP    1 tsp po at night before bed prn cough   IBUPROFEN (ADVIL,MOTRIN) 600 MG TABLET    Take 1 tablet (600 mg total) by mouth every 6 (six) hours as needed.   TRAMADOL (ULTRAM) 50 MG TABLET    Take 1 tablet (50 mg total) by mouth every 12 (twelve) hours as needed for severe pain.

## 2015-11-18 NOTE — Progress Notes (Signed)
Pre visit review using our clinic review tool, if applicable. No additional management support is needed unless otherwise documented below in the visit note. 

## 2015-11-19 ENCOUNTER — Telehealth: Payer: Self-pay | Admitting: Family Medicine

## 2015-11-19 NOTE — Telephone Encounter (Signed)
Patient Name: Carol Crosby DOB: 1961/04/06 Initial Comment Caller states she may have the flu, was seen yesterday. Vomiting. Nurse Assessment Nurse: Marcelline Deist, RN, Lynda Date/Time (Eastern Time): 11/19/2015 1:58:52 PM Confirm and document reason for call. If symptomatic, describe symptoms. You must click the next button to save text entered. ---Caller states she may have the flu, was seen yesterday, test negative. Has had congestion, headache, body aches & fever. Vomiting since last night. Symptoms started Sat. Has a sore throat as well, they didn't check for strep throat. Fever earlier today. Has the patient traveled out of the country within the last 30 days? ---Not Applicable Does the patient have any new or worsening symptoms? ---Yes Will a triage be completed? ---Yes Related visit to physician within the last 2 weeks? ---Yes Does the PT have any chronic conditions? (i.e. diabetes, asthma, etc.) ---Yes List chronic conditions. ---arthritis Did the patient indicate they were pregnant? ---No Is this a behavioral health or substance abuse call? ---No Guidelines Guideline Title Affirmed Question Affirmed Notes Vomiting [1] Vomiting AND [2] contains bile (green color) Final Disposition User See Physician within 4 Hours (or PCP triage) Marcelline Deist, RN, Lynda Comments Please contact caller with further advice as she is not feeling very well at all & was just in yesterday. They did not test her for strep at that time. Caller advised not to continue to take Ibuprofen with the vomiting. Advised to try small sips of fluid to stay hydrated. She has noticed a bile color to the vomit. Referrals REFERRED TO PCP OFFICE Disagree/Comply: Comply

## 2015-11-19 NOTE — Telephone Encounter (Signed)
Strep is much less likely if congestion and cough present.  Supportive care, fluids, cough medicines as needed, and anti-pyretics. If emesis not resolving or cannot keep down fluids at all, make appt to be seen.

## 2015-11-20 ENCOUNTER — Encounter: Payer: Self-pay | Admitting: Family Medicine

## 2015-11-20 ENCOUNTER — Ambulatory Visit (INDEPENDENT_AMBULATORY_CARE_PROVIDER_SITE_OTHER): Payer: BC Managed Care – PPO | Admitting: Family Medicine

## 2015-11-20 VITALS — BP 118/82 | HR 88 | Temp 98.3°F | Ht 62.0 in | Wt 252.5 lb

## 2015-11-20 DIAGNOSIS — J019 Acute sinusitis, unspecified: Secondary | ICD-10-CM | POA: Insufficient documentation

## 2015-11-20 DIAGNOSIS — J0100 Acute maxillary sinusitis, unspecified: Secondary | ICD-10-CM

## 2015-11-20 DIAGNOSIS — R110 Nausea: Secondary | ICD-10-CM

## 2015-11-20 MED ORDER — PROMETHAZINE HCL 25 MG/ML IJ SOLN
50.0000 mg | Freq: Once | INTRAMUSCULAR | Status: AC
  Administered 2015-11-20: 50 mg via INTRAMUSCULAR

## 2015-11-20 MED ORDER — PROMETHAZINE HCL 25 MG PO TABS: 25.0000 mg | ORAL_TABLET | Freq: Four times a day (QID) | ORAL | Status: DC | PRN

## 2015-11-20 MED ORDER — AMOXICILLIN 250 MG/5ML PO SUSR: ORAL | Status: DC

## 2015-11-20 NOTE — Progress Notes (Signed)
Pre visit review using our clinic review tool, if applicable. No additional management support is needed unless otherwise documented below in the visit note. 

## 2015-11-20 NOTE — Progress Notes (Signed)
Subjective:    Patient ID: Carol Crosby, female    DOB: [birth year], 55 y.o.   MRN: [id number]  HPI Here for uri symptoms   Was seen 1/23 for flu like symptoms Flu swab neg Seen by Dr Lorelei Pont -he still offered her tamiflu but declined for financial reasons   Now she is nauseated and vomiting -started last night  No exp to stomach bug  No exp to the flu   Aches in face and sinuses  Very congested  Brownish mucous   Not a lot of cough  Not productive when she does   Temp is up and down  This am was 100  Overall trending down   Throat and ears hurt - ears are a little worse than they were   Moves bowels when she throws up -but not really loose   Tried to take tylenol cold and flu  Felt like she got a pill stuck last night - big pill -too hard to swallowing   Does not want to take a big pill -does not do well with them    Patient Active Problem List   Diagnosis Date Noted  . Acute sinusitis 11/20/2015  . Lipoma 12/10/2014  . Right shoulder pain 12/10/2014  . Severe headache 01/23/2014  . HTN (hypertension), benign 01/09/2014  . Right knee pain 01/09/2014  . Throat irritation 01/09/2014  . OBSTRUCTIVE SLEEP APNEA 01/14/2009  . Morbid obesity ([place]) 11/30/2007  . 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 Other chest pain   . Morbid obesity ([place])   . Obstructive sleep apnea (adult) (pediatric)   . Hypersomnia, unspecified   . Excessive or frequent menstruation   . Abdominal pain, right upper quadrant   . Acute sinusitis, unspecified   . Streptococcal sore throat   . Mixed incontinence urge and stress (female)(female)   . Unspecified urinary incontinence   . Esophageal reflux   . Temporomandibular joint disorders, unspecified   . Scarlet fever   . Palpitations    Past Surgical History  Procedure Laterality Date  . Temporomandibular joint surgery  1981  . Cholecystectomy    . Tonsillectomy    . Extremity cyst  excision  2000    Wrist  . Refractive surgery  1997  . Cesarean section  2005   Social History  Substance Use Topics  . Smoking status: Former Smoker    Quit date: 10/26/2001  . Smokeless tobacco: Never Used  . Alcohol Use: No   Family History  Problem Relation Age of Onset  . Diabetes Father   . Migraines Mother   . Hypertension Mother   . Fibromyalgia Sister   . Colon cancer Maternal Grandmother   . Other Brother     ? Heart issue   Allergies  Allergen Reactions  . Erythromycin Base     REACTION: Nausea and vomiting   Current Outpatient Prescriptions on File Prior to Visit  Medication Sig Dispense Refill  . diclofenac (VOLTAREN) 75 MG EC tablet TAKE ONE TABLET BY MOUTH TWICE DAILY 60 tablet 0  . traZODone (DESYREL) 50 MG tablet Take 0.5-1 tablets (25-50 mg total) by mouth at bedtime as needed for sleep. 30 tablet 3   No current facility-administered medications on file prior to visit.    Review of Systems  Constitutional: Positive for fever and appetite change. Negative for fatigue.  HENT: Positive for congestion, ear pain, postnasal drip, rhinorrhea, sinus pressure and sore throat. Negative for  nosebleeds.   Eyes: Negative for pain, redness and itching.  Respiratory: Positive for cough. Negative for shortness of breath and wheezing.   Cardiovascular: Negative for chest pain.  Gastrointestinal: Positive for nausea and vomiting. Negative for abdominal pain and diarrhea.  Endocrine: Negative for polyuria.  Genitourinary: Negative for dysuria, urgency and frequency.  Musculoskeletal: Negative for myalgias and arthralgias.  Allergic/Immunologic: Negative for immunocompromised state.  Neurological: Positive for headaches. Negative for dizziness, tremors, syncope, weakness and numbness.  Hematological: Negative for adenopathy. Does not bruise/bleed easily.  Psychiatric/Behavioral: Negative for dysphoric mood. The patient is not nervous/anxious.        Objective:    Physical Exam  Constitutional: She appears well-developed and well-nourished. No distress.  Morbidly obese and well but fatigued appearing   HENT:  Head: Normocephalic and atraumatic.  Right Ear: External ear normal.  Left Ear: External ear normal.  Mouth/Throat: Oropharynx is clear and moist. No oropharyngeal exudate.  Nares are injected and congested  Bilateral maxillary sinus tenderness  Post nasal drip  Injected throat without swelling   Eyes: Conjunctivae and EOM are normal. Pupils are equal, round, and reactive to light. Right eye exhibits no discharge. Left eye exhibits no discharge.  Neck: Normal range of motion. Neck supple.  Cardiovascular: Normal rate and regular rhythm.   Pulmonary/Chest: Effort normal and breath sounds normal. No respiratory distress. She has no wheezes. She has no rales. She exhibits no tenderness.  Abdominal: Soft. Bowel sounds are normal. She exhibits no distension. There is no tenderness.  Lymphadenopathy:    She has no cervical adenopathy.  Neurological: She is alert. No cranial nerve deficit.  Skin: Skin is warm and dry. No rash noted.  Psychiatric: She has a normal mood and affect.          Assessment & Plan:   Problem List Items Addressed This Visit      Respiratory   Acute sinusitis - Primary    With viral uri - now n/v likely from pnd as well  Prev flu screen neg/ and fever is gradually coming down per pt  Cover with amoxicillin - (liquid since she cannot swallow big pills) Phenergan -injection now 50 IM and then px 25 mg pills for tid prn  Enc fluids and disc s/s dehydration  Disc symptomatic care - see instructions on AVS   Update if not starting to improve in a week or if worsening         Relevant Medications   promethazine (PHENERGAN) 25 MG tablet   amoxicillin (AMOXIL) 250 MG/5ML suspension   promethazine (PHENERGAN) injection 50 mg (Completed)    Other Visit Diagnoses    Nausea without vomiting        Relevant  Medications    promethazine (PHENERGAN) injection 50 mg (Completed)

## 2015-11-20 NOTE — Telephone Encounter (Signed)
Pt was seen

## 2015-11-20 NOTE — Telephone Encounter (Signed)
[place] notified as instructed by telephone.  She would like to be re-evaluated.  Appointment scheduled with Dr. Glori Bickers today at 12:15 pm.

## 2015-11-20 NOTE — Patient Instructions (Signed)
Drink lots of fluids when you can and rest Phenergan for nausea - a shot here and a px for home  Amoxicillin for sinus infection  Tylenol over the counter for aches and fever    Update if not starting to improve in a week or if worsening

## 2015-11-21 NOTE — Assessment & Plan Note (Signed)
With viral uri - now n/v likely from pnd as well  Prev flu screen neg/ and fever is gradually coming down per pt  Cover with amoxicillin - (liquid since she cannot swallow big pills) Phenergan -injection now 50 IM and then px 25 mg pills for tid prn  Enc fluids and disc s/s dehydration  Disc symptomatic care - see instructions on AVS   Update if not starting to improve in a week or if worsening

## 2015-12-23 ENCOUNTER — Other Ambulatory Visit: Payer: Self-pay | Admitting: Family Medicine

## 2015-12-23 NOTE — Telephone Encounter (Signed)
Last office visit 11/20/2015.  Last refilled 09/30/2015 for #60 with no refills.  Ok to refill?

## 2015-12-31 ENCOUNTER — Ambulatory Visit (INDEPENDENT_AMBULATORY_CARE_PROVIDER_SITE_OTHER): Payer: BC Managed Care – PPO | Admitting: Family Medicine

## 2015-12-31 ENCOUNTER — Encounter: Payer: Self-pay | Admitting: Family Medicine

## 2015-12-31 VITALS — BP 140/84 | HR 75 | Temp 97.5°F | Ht 62.0 in | Wt 259.5 lb

## 2015-12-31 DIAGNOSIS — M25551 Pain in right hip: Secondary | ICD-10-CM

## 2015-12-31 DIAGNOSIS — G4700 Insomnia, unspecified: Secondary | ICD-10-CM

## 2015-12-31 DIAGNOSIS — J069 Acute upper respiratory infection, unspecified: Secondary | ICD-10-CM | POA: Diagnosis not present

## 2015-12-31 DIAGNOSIS — G8929 Other chronic pain: Secondary | ICD-10-CM

## 2015-12-31 DIAGNOSIS — F5104 Psychophysiologic insomnia: Secondary | ICD-10-CM | POA: Insufficient documentation

## 2015-12-31 DIAGNOSIS — B9789 Other viral agents as the cause of diseases classified elsewhere: Principal | ICD-10-CM

## 2015-12-31 NOTE — Assessment & Plan Note (Signed)
Discussed sleep hygiene. Can try trazodone 100 mg at bedtime. If not improving make appt to discuss other options.

## 2015-12-31 NOTE — Assessment & Plan Note (Signed)
Symptomatic care 

## 2015-12-31 NOTE — Addendum Note (Signed)
Addended by: Eliezer Lofts E on: 12/31/2015 04:15 PM   Modules accepted: Orders

## 2015-12-31 NOTE — Progress Notes (Addendum)
   Subjective:    Patient ID: Carol Crosby, female    DOB: Feb 27, 1961, 55 y.o.   MRN: [id number]  Cough This is a new problem. Episode onset:  5 days ago. The cough is productive of sputum. Associated symptoms include headaches and nasal congestion. Pertinent negatives include no chills, ear congestion, ear pain, fever, myalgias, shortness of breath or wheezing. Associated symptoms comments: No ear pain or sinus pain. The symptoms are aggravated by lying down. Risk factors: nonsmoker. She has tried prescription cough suppressant for the symptoms. The treatment provided mild relief. There is no history of asthma, bronchitis, COPD, environmental allergies or pneumonia.  Headache  Associated symptoms include coughing. Pertinent negatives include no ear pain or fever.    Chronic insomnia: Trazodone 50 mg helping got to sleep but not stay asleep.   Social History /Family History/Past Medical History reviewed and updated if needed.  Sinus infection treated with amox, completely resolved.  right hip pain, not better with diclofenac . Wishes to have referral to orhto.  Review of Systems  Constitutional: Negative for fever and chills.  HENT: Negative for ear pain.   Respiratory: Positive for cough. Negative for shortness of breath and wheezing.   Musculoskeletal: Negative for myalgias.  Allergic/Immunologic: Negative for environmental allergies.  Neurological: Positive for headaches.       Objective:   Physical Exam  Constitutional: Vital signs are normal. She appears well-developed and well-nourished. She is cooperative.  Non-toxic appearance. She does not appear ill. No distress.  Morbidly obese  HENT:  Head: Normocephalic.  Right Ear: Hearing, tympanic membrane, external ear and ear canal normal. Tympanic membrane is not erythematous, not retracted and not bulging.  Left Ear: Hearing, tympanic membrane, external ear and ear canal normal. Tympanic membrane is not erythematous, not retracted  and not bulging.  Nose: Mucosal edema and rhinorrhea present. Right sinus exhibits no maxillary sinus tenderness and no frontal sinus tenderness. Left sinus exhibits no maxillary sinus tenderness and no frontal sinus tenderness.  Mouth/Throat: Uvula is midline, oropharynx is clear and moist and mucous membranes are normal.  Eyes: Conjunctivae, EOM and lids are normal. Pupils are equal, round, and reactive to light. Lids are everted and swept, no foreign bodies found.  Neck: Trachea normal and normal range of motion. Neck supple. Carotid bruit is not present. No thyroid mass and no thyromegaly present.  Cardiovascular: Normal rate, regular rhythm, S1 normal, S2 normal, normal heart sounds, intact distal pulses and normal pulses.  Exam reveals no gallop and no friction rub.   No murmur heard. Pulmonary/Chest: Effort normal and breath sounds normal. No tachypnea. No respiratory distress. She has no decreased breath sounds. She has no wheezes. She has no rhonchi. She has no rales.  Neurological: She is alert.  Skin: Skin is warm, dry and intact. No rash noted.  Psychiatric: Her speech is normal and behavior is normal. Judgment normal. Her mood appears not anxious. Cognition and memory are normal. She does not exhibit a depressed mood.          Assessment & Plan:

## 2015-12-31 NOTE — Patient Instructions (Addendum)
Cough suppressant at night. During the day mucinex DM. Start nasal saline spray 2-3 times  Ady. Start flonase 2 spray daily. Rest, fluids.  Call if new fever > 101.4 F, increase in shortness of breath, or unilateral face pain.  Expect 05-04-13 days of cough, but should be gradually feeling better. Once feeling better okay and no longer on cough suppressant with codeine to take trazodone 1-2 tabs at bedtime for sleep. Make follow up appt for joint issues or insomnia if not improving

## 2015-12-31 NOTE — Progress Notes (Signed)
Pre visit review using our clinic review tool, if applicable. No additional management support is needed unless otherwise documented below in the visit note. 

## 2016-01-10 ENCOUNTER — Other Ambulatory Visit: Payer: Self-pay | Admitting: Family Medicine

## 2016-01-10 ENCOUNTER — Encounter: Payer: Self-pay | Admitting: Family Medicine

## 2016-01-10 MED ORDER — DICLOFENAC SODIUM 75 MG PO TBEC: 75.0000 mg | DELAYED_RELEASE_TABLET | Freq: Two times a day (BID) | ORAL | Status: DC

## 2016-01-10 NOTE — Telephone Encounter (Signed)
Pt notified through mychart.

## 2016-01-24 ENCOUNTER — Ambulatory Visit (INDEPENDENT_AMBULATORY_CARE_PROVIDER_SITE_OTHER): Payer: BC Managed Care – PPO | Admitting: Family Medicine

## 2016-01-24 ENCOUNTER — Encounter: Payer: Self-pay | Admitting: Family Medicine

## 2016-01-24 ENCOUNTER — Telehealth: Payer: Self-pay | Admitting: *Deleted

## 2016-01-24 VITALS — BP 152/92 | HR 83 | Temp 97.5°F | Ht 62.0 in | Wt 262.5 lb

## 2016-01-24 DIAGNOSIS — I10 Essential (primary) hypertension: Secondary | ICD-10-CM | POA: Diagnosis not present

## 2016-01-24 LAB — COMPREHENSIVE METABOLIC PANEL
ALT: 18 U/L (ref 6–29)
AST: 14 U/L (ref 10–35)
Albumin: 4.1 g/dL (ref 3.6–5.1)
Alkaline Phosphatase: 129 U/L (ref 33–130)
BILIRUBIN TOTAL: 0.4 mg/dL (ref 0.2–1.2)
BUN: 16 mg/dL (ref 7–25)
CHLORIDE: 102 mmol/L (ref 98–110)
CO2: 27 mmol/L (ref 20–31)
CREATININE: 0.64 mg/dL (ref 0.50–1.05)
Calcium: 8.9 mg/dL (ref 8.6–10.4)
GLUCOSE: 96 mg/dL (ref 65–99)
Potassium: 5.1 mmol/L (ref 3.5–5.3)
SODIUM: 139 mmol/L (ref 135–146)
Total Protein: 6.9 g/dL (ref 6.1–8.1)

## 2016-01-24 LAB — CBC WITH DIFFERENTIAL/PLATELET
BASOS PCT: 0 % (ref 0–1)
Basophils Absolute: 0 10*3/uL (ref 0.0–0.1)
EOS ABS: 0.1 10*3/uL (ref 0.0–0.7)
EOS PCT: 2 % (ref 0–5)
HCT: 41.4 % (ref 36.0–46.0)
HEMOGLOBIN: 13.4 g/dL (ref 12.0–15.0)
Lymphocytes Relative: 37 % (ref 12–46)
Lymphs Abs: 2.5 10*3/uL (ref 0.7–4.0)
MCH: 29.5 pg (ref 26.0–34.0)
MCHC: 32.4 g/dL (ref 30.0–36.0)
MCV: 91 fL (ref 78.0–100.0)
MONO ABS: 0.6 10*3/uL (ref 0.1–1.0)
MONOS PCT: 9 % (ref 3–12)
MPV: 9.5 fL (ref 8.6–12.4)
NEUTROS ABS: 3.5 10*3/uL (ref 1.7–7.7)
Neutrophils Relative %: 52 % (ref 43–77)
PLATELETS: 290 10*3/uL (ref 150–400)
RBC: 4.55 MIL/uL (ref 3.87–5.11)
RDW: 14.2 % (ref 11.5–15.5)
WBC: 6.8 10*3/uL (ref 4.0–10.5)

## 2016-01-24 LAB — TSH: TSH: 3.48 mIU/L

## 2016-01-24 MED ORDER — LOSARTAN POTASSIUM-HCTZ 50-12.5 MG PO TABS: 1.0000 | ORAL_TABLET | Freq: Every day | ORAL | Status: DC

## 2016-01-24 NOTE — Progress Notes (Signed)
Pre visit review using our clinic review tool, if applicable. No additional management support is needed unless otherwise documented below in the visit note. 

## 2016-01-24 NOTE — Patient Instructions (Addendum)
Consider holding diclofenac, work on decreasing salt in diet ( 2000 mg daily), working on weight loss, exercise .  Restart losartan/HCTZ. Stop at lab on way out.

## 2016-01-24 NOTE — Progress Notes (Signed)
   Subjective:    Patient ID: Carol Crosby, female    DOB: 1960/12/12, 55 y.o.   MRN: [id number]  HPI   55 year old female presents with acute elevation of blood pressure.   Hypertension:     She was previously on medications ( HCTZ, losartan in 2015) no SE but was able to come off in past in 2016 now with weight gain in last week her BP has been increased. She is also on diclofenac. Using medication without problems or lightheadedness:   BP Readings from Last 3 Encounters:  01/24/16 152/92  12/31/15 140/84  11/20/15 118/82  Chest pain with exertion:None Edema:None Short of breath:None Average home BPs:148/83, 170/103 at MD yesterday. Other issues:  She has headache in last week as well.  She is using the diclofenac twice daily.  Wt Readings from Last 3 Encounters:  01/24/16 262 lb 8 oz (119.069 kg)  12/31/15 259 lb 8 oz (117.708 kg)  11/20/15 252 lb 8 oz (114.533 kg)        Review of Systems  Constitutional: Negative for fever and fatigue.  HENT: Negative for ear pain.   Eyes: Negative for pain.  Respiratory: Negative for chest tightness and shortness of breath.   Cardiovascular: Negative for chest pain, palpitations and leg swelling.  Gastrointestinal: Negative for abdominal pain.  Genitourinary: Negative for dysuria.       Objective:   Physical Exam  Constitutional: Vital signs are normal. She appears well-developed and well-nourished. She is cooperative.  Non-toxic appearance. She does not appear ill. No distress.  Morbidly obese in NAD  HENT:  Head: Normocephalic.  Right Ear: Hearing, tympanic membrane, external ear and ear canal normal. Tympanic membrane is not erythematous, not retracted and not bulging.  Left Ear: Hearing, tympanic membrane, external ear and ear canal normal. Tympanic membrane is not erythematous, not retracted and not bulging.  Nose: No mucosal edema or rhinorrhea. Right sinus exhibits no maxillary sinus tenderness and no frontal sinus  tenderness. Left sinus exhibits no maxillary sinus tenderness and no frontal sinus tenderness.  Mouth/Throat: Uvula is midline, oropharynx is clear and moist and mucous membranes are normal.  Eyes: Conjunctivae, EOM and lids are normal. Pupils are equal, round, and reactive to light. Lids are everted and swept, no foreign bodies found.  Neck: Trachea normal and normal range of motion. Neck supple. Carotid bruit is not present. No thyroid mass and no thyromegaly present.  Cardiovascular: Normal rate, regular rhythm, S1 normal, S2 normal, normal heart sounds, intact distal pulses and normal pulses.  Exam reveals no gallop and no friction rub.   No murmur heard. Pulmonary/Chest: Effort normal and breath sounds normal. No tachypnea. No respiratory distress. She has no decreased breath sounds. She has no wheezes. She has no rhonchi. She has no rales.  Abdominal: Soft. Normal appearance and bowel sounds are normal. There is no tenderness.  Neurological: She is alert.  Skin: Skin is warm, dry and intact. No rash noted.  Psychiatric: Her speech is normal and behavior is normal. Judgment and thought content normal. Her mood appears not anxious. Cognition and memory are normal. She does not exhibit a depressed mood.          Assessment & Plan:

## 2016-01-24 NOTE — Addendum Note (Signed)
Addended by: Marchia Bond on: 01/24/2016 04:36 PM   Modules accepted: Orders, SmartSet

## 2016-01-24 NOTE — Telephone Encounter (Signed)
Pt contacted office and states that she was seen by ortho and BP was 170/103. Pt indicates that she was on BP meds back in 2013, and BP has been ok since. Acute appt scheduled for today.

## 2016-01-24 NOTE — Telephone Encounter (Signed)
Noted  

## 2016-01-24 NOTE — Assessment & Plan Note (Signed)
Poor control. Restart medication losartan HCTZ. Follow BP at home.  Likely due to obesity, salt intake and diclofenac. Eval with labs to rule out secondary cause.

## 2016-03-29 ENCOUNTER — Other Ambulatory Visit: Payer: Self-pay | Admitting: Family Medicine

## 2016-03-29 NOTE — Telephone Encounter (Signed)
Last office visit 01/24/16. Last refilled 11/07/15 for #30 with 3 refills.  Ok to refill?

## 2016-04-01 ENCOUNTER — Other Ambulatory Visit: Payer: Self-pay | Admitting: Orthopaedic Surgery

## 2016-04-01 DIAGNOSIS — M25552 Pain in left hip: Secondary | ICD-10-CM

## 2016-04-06 ENCOUNTER — Other Ambulatory Visit: Payer: Self-pay | Admitting: Physician Assistant

## 2016-04-08 ENCOUNTER — Ambulatory Visit
Admission: RE | Admit: 2016-04-08 | Discharge: 2016-04-08 | Disposition: A | Discharge status: Home / Self Care | Payer: BC Managed Care – PPO | Source: Ambulatory Visit | Attending: Orthopaedic Surgery | Admitting: Orthopaedic Surgery

## 2016-04-08 DIAGNOSIS — M25552 Pain in left hip: Secondary | ICD-10-CM

## 2016-04-14 ENCOUNTER — Encounter (HOSPITAL_COMMUNITY)
Admission: RE | Admit: 2016-04-14 | Discharge: 2016-04-14 | Disposition: A | Discharge status: Home / Self Care | Payer: BC Managed Care – PPO | Source: Ambulatory Visit | Attending: Orthopaedic Surgery | Admitting: Orthopaedic Surgery

## 2016-04-14 ENCOUNTER — Encounter (HOSPITAL_COMMUNITY): Payer: Self-pay

## 2016-04-14 DIAGNOSIS — M879 Osteonecrosis, unspecified: Secondary | ICD-10-CM | POA: Insufficient documentation

## 2016-04-14 DIAGNOSIS — I10 Essential (primary) hypertension: Secondary | ICD-10-CM | POA: Insufficient documentation

## 2016-04-14 DIAGNOSIS — Z01812 Encounter for preprocedural laboratory examination: Secondary | ICD-10-CM | POA: Insufficient documentation

## 2016-04-14 DIAGNOSIS — Z01818 Encounter for other preprocedural examination: Secondary | ICD-10-CM | POA: Insufficient documentation

## 2016-04-14 HISTORY — DX: Unspecified osteoarthritis, unspecified site: M19.90

## 2016-04-14 HISTORY — DX: Essential (primary) hypertension: I10

## 2016-04-14 HISTORY — DX: Personal history of other diseases of the respiratory system: Z87.09

## 2016-04-14 LAB — BASIC METABOLIC PANEL
ANION GAP: 8 (ref 5–15)
BUN: 12 mg/dL (ref 6–20)
CALCIUM: 9.4 mg/dL (ref 8.9–10.3)
CO2: 27 mmol/L (ref 22–32)
Chloride: 105 mmol/L (ref 101–111)
Creatinine, Ser: 0.75 mg/dL (ref 0.44–1.00)
Glucose, Bld: 101 mg/dL — ABNORMAL HIGH (ref 65–99)
Potassium: 4.1 mmol/L (ref 3.5–5.1)
Sodium: 140 mmol/L (ref 135–145)

## 2016-04-14 LAB — SURGICAL PCR SCREEN
MRSA, PCR: NEGATIVE
STAPHYLOCOCCUS AUREUS: NEGATIVE

## 2016-04-14 NOTE — Pre-Procedure Instructions (Signed)
KENNY TIBERIO  04/14/2016      [facility] - [place], [place] - [address] [place] [place] [address] Phone: [phone] Fax: [phone]    Your procedure is scheduled on Tuesday, June 27th, 2017.  Report to [facility] Admitting at 1:00 P.M.   Call this number if you have problems the morning of surgery:  [phone]   Remember:  Do not eat food or drink liquids after midnight.   Take these medicines the morning of surgery with A SIP OF WATER: Acetaminophen (Tylenol) if needed, Hydrocodone-acetaminophen (Norco) if needed, Tramadol (Ultram) if needed.  Stop taking: Aspirin, NSAIDS, Aleve, Naproxen, Ibuprofen, Advil, Motrin, BC's, Goody's, Fish oil, all herbal medications, and all vitamins.    Do not wear jewelry, make-up or nail polish.  Do not wear lotions, powders, or perfumes.    Do not shave 48 hours prior to surgery.    Do not bring valuables to the hospital.  [facility] is not responsible for any belongings or valuables.  Contacts, dentures or bridgework may not be worn into surgery.  Leave your suitcase in the car.  After surgery it may be brought to your room.  For patients admitted to the hospital, discharge time will be determined by your treatment team.  Patients discharged the day of surgery will not be allowed to drive home.   Special instructions:  See attached.   Please read over the following fact sheets that you were given. MRSA Information    [facility]- Preparing For Surgery  Before surgery, you can play an important role. Because skin is not sterile, your skin needs to be as free of germs as possible. You can reduce the number of germs on your skin by washing with CHG (chlorahexidine gluconate) Soap before surgery.  CHG is an antiseptic cleaner which kills germs and bonds with the skin to continue killing germs even after washing.  Please do not use if you have an allergy to CHG or antibacterial  soaps. If your skin becomes reddened/irritated stop using the CHG.  Do not shave (including legs and underarms) for at least 48 hours prior to first CHG shower. It is OK to shave your face.  Please follow these instructions carefully.   1. Shower the NIGHT BEFORE SURGERY and the MORNING OF SURGERY with CHG.   2. If you chose to wash your hair, wash your hair first as usual with your normal shampoo.  3. After you shampoo, rinse your hair and body thoroughly to remove the shampoo.  4. Use CHG as you would any other liquid soap. You can apply CHG directly to the skin and wash gently with a scrungie or a clean washcloth.   5. Apply the CHG Soap to your body ONLY FROM THE NECK DOWN.  Do not use on open wounds or open sores. Avoid contact with your eyes, ears, mouth and genitals (private parts). Wash genitals (private parts) with your normal soap.  6. Wash thoroughly, paying special attention to the area where your surgery will be performed.  7. Thoroughly rinse your body with warm water from the neck down.  8. DO NOT shower/wash with your normal soap after using and rinsing off the CHG Soap.  9. Pat yourself dry with a CLEAN TOWEL.   10. Wear CLEAN PAJAMAS   11. Place CLEAN SHEETS on your bed the night of your first shower and DO NOT SLEEP WITH PETS.  Day of Surgery: Do not apply any  deodorants/lotions. Please wear clean clothes to the hospital/surgery center.

## 2016-04-14 NOTE — Progress Notes (Signed)
PCP - Dr. Eliezer Lofts Cardiologist - denies  EKG - 04/14/16 CXR - denies  Echo/cardiac cath - denies Stress test - more than 7 years ago  Patient denies chest pain and shortness of breath at PAT appointment.

## 2016-04-20 MED ORDER — CEFAZOLIN SODIUM-DEXTROSE 2-4 GM/100ML-% IV SOLN
2.0000 g | INTRAVENOUS | Status: AC
  Administered 2016-04-21: 3 g via INTRAVENOUS
  Filled 2016-04-20: qty 100

## 2016-04-20 MED ORDER — TRANEXAMIC ACID 1000 MG/10ML IV SOLN
1000.0000 mg | INTRAVENOUS | Status: AC
  Administered 2016-04-21: 1000 mg via INTRAVENOUS
  Filled 2016-04-20: qty 10

## 2016-04-20 NOTE — Anesthesia Preprocedure Evaluation (Addendum)
Anesthesia Evaluation  Patient identified by MRN, date of birth, ID band Patient awake    Reviewed: Allergy & Precautions, NPO status , Patient's Chart, lab work & pertinent test results  Airway Mallampati: III  TM Distance: >3 FB Neck ROM: Full    Dental no notable dental hx. (+) Teeth Intact   Pulmonary sleep apnea and Continuous Positive Airway Pressure Ventilation , former smoker,    Pulmonary exam normal breath sounds clear to auscultation       Cardiovascular hypertension, Pt. on medications Normal cardiovascular exam Rhythm:Regular Rate:Normal     Neuro/Psych  Headaches, negative psych ROS   GI/Hepatic Neg liver ROS, GERD  Medicated and Controlled,  Endo/Other  Morbid obesity  Renal/GU negative Renal ROS  negative genitourinary   Musculoskeletal  (+) Arthritis , AVN left hip   Abdominal (+) + obese,   Peds  Hematology negative hematology ROS (+)   Anesthesia Other Findings   Reproductive/Obstetrics negative OB ROS                           Anesthesia Physical Anesthesia Plan  ASA: III  Anesthesia Plan: General   Post-op Pain Management:    Induction: Intravenous  Airway Management Planned: Oral ETT  Additional Equipment:   Intra-op Plan:   Post-operative Plan: Extubation in OR  Informed Consent: I have reviewed the patients History and Physical, chart, labs and discussed the procedure including the risks, benefits and alternatives for the proposed anesthesia with the patient or authorized representative who has indicated his/her understanding and acceptance.   Dental advisory given  Plan Discussed with: CRNA, Anesthesiologist and Surgeon  Anesthesia Plan Comments:         Anesthesia Quick Evaluation

## 2016-04-21 ENCOUNTER — Encounter (HOSPITAL_COMMUNITY): Payer: Self-pay | Admitting: General Practice

## 2016-04-21 ENCOUNTER — Encounter (HOSPITAL_COMMUNITY)
Admission: RE | Disposition: A | Discharge status: Home Health | Payer: Self-pay | Source: Ambulatory Visit | Attending: Orthopaedic Surgery

## 2016-04-21 ENCOUNTER — Inpatient Hospital Stay (HOSPITAL_COMMUNITY): Payer: BC Managed Care – PPO

## 2016-04-21 ENCOUNTER — Inpatient Hospital Stay (HOSPITAL_COMMUNITY): Payer: BC Managed Care – PPO | Admitting: Anesthesiology

## 2016-04-21 ENCOUNTER — Inpatient Hospital Stay (HOSPITAL_COMMUNITY)
Admission: RE | Admit: 2016-04-21 | Discharge: 2016-04-24 | DRG: 470 | Disposition: A | Discharge status: Home Health | Payer: BC Managed Care – PPO | Source: Ambulatory Visit | Attending: Orthopaedic Surgery | Admitting: Orthopaedic Surgery

## 2016-04-21 DIAGNOSIS — Z6841 Body Mass Index (BMI) 40.0 and over, adult: Secondary | ICD-10-CM | POA: Diagnosis not present

## 2016-04-21 DIAGNOSIS — M1612 Unilateral primary osteoarthritis, left hip: Secondary | ICD-10-CM | POA: Diagnosis present

## 2016-04-21 DIAGNOSIS — L989 Disorder of the skin and subcutaneous tissue, unspecified: Secondary | ICD-10-CM

## 2016-04-21 DIAGNOSIS — G4733 Obstructive sleep apnea (adult) (pediatric): Secondary | ICD-10-CM | POA: Diagnosis present

## 2016-04-21 DIAGNOSIS — Z87891 Personal history of nicotine dependence: Secondary | ICD-10-CM

## 2016-04-21 DIAGNOSIS — I10 Essential (primary) hypertension: Secondary | ICD-10-CM | POA: Diagnosis present

## 2016-04-21 DIAGNOSIS — Z833 Family history of diabetes mellitus: Secondary | ICD-10-CM

## 2016-04-21 DIAGNOSIS — I82402 Acute embolism and thrombosis of unspecified deep veins of left lower extremity: Secondary | ICD-10-CM

## 2016-04-21 DIAGNOSIS — Z881 Allergy status to other antibiotic agents status: Secondary | ICD-10-CM

## 2016-04-21 DIAGNOSIS — Z96642 Presence of left artificial hip joint: Secondary | ICD-10-CM

## 2016-04-21 DIAGNOSIS — Z419 Encounter for procedure for purposes other than remedying health state, unspecified: Secondary | ICD-10-CM

## 2016-04-21 DIAGNOSIS — M79609 Pain in unspecified limb: Secondary | ICD-10-CM | POA: Diagnosis not present

## 2016-04-21 DIAGNOSIS — K219 Gastro-esophageal reflux disease without esophagitis: Secondary | ICD-10-CM | POA: Diagnosis present

## 2016-04-21 DIAGNOSIS — Z8249 Family history of ischemic heart disease and other diseases of the circulatory system: Secondary | ICD-10-CM

## 2016-04-21 DIAGNOSIS — M1611 Unilateral primary osteoarthritis, right hip: Secondary | ICD-10-CM

## 2016-04-21 HISTORY — PX: TOTAL HIP ARTHROPLASTY: SHX124

## 2016-04-21 SURGERY — ARTHROPLASTY, HIP, TOTAL, ANTERIOR APPROACH
Anesthesia: General | Site: Hip | Laterality: Left

## 2016-04-21 MED ORDER — ASPIRIN EC 325 MG PO TBEC
325.0000 mg | DELAYED_RELEASE_TABLET | Freq: Two times a day (BID) | ORAL | Status: DC
  Administered 2016-04-21 – 2016-04-24 (×6): 325 mg via ORAL
  Filled 2016-04-21 (×6): qty 1

## 2016-04-21 MED ORDER — DOCUSATE SODIUM 100 MG PO CAPS
100.0000 mg | ORAL_CAPSULE | Freq: Two times a day (BID) | ORAL | Status: DC
  Administered 2016-04-21 – 2016-04-24 (×6): 100 mg via ORAL
  Filled 2016-04-21 (×6): qty 1

## 2016-04-21 MED ORDER — MORPHINE SULFATE (PF) 4 MG/ML IV SOLN
4.0000 mg | Freq: Once | INTRAVENOUS | Status: AC
  Administered 2016-04-21: 4 mg via INTRAVENOUS

## 2016-04-21 MED ORDER — METHOCARBAMOL 500 MG PO TABS
500.0000 mg | ORAL_TABLET | Freq: Four times a day (QID) | ORAL | Status: DC | PRN
  Administered 2016-04-23: 500 mg via ORAL
  Filled 2016-04-21: qty 1

## 2016-04-21 MED ORDER — MIDAZOLAM HCL 2 MG/2ML IJ SOLN
INTRAMUSCULAR | Status: AC
  Filled 2016-04-21: qty 2

## 2016-04-21 MED ORDER — PROPOFOL 10 MG/ML IV BOLUS
INTRAVENOUS | Status: DC | PRN
  Administered 2016-04-21: 200 mg via INTRAVENOUS

## 2016-04-21 MED ORDER — HYDROMORPHONE HCL 1 MG/ML IJ SOLN
0.2500 mg | INTRAMUSCULAR | Status: DC | PRN
  Administered 2016-04-21 (×4): 0.5 mg via INTRAVENOUS

## 2016-04-21 MED ORDER — CHLORHEXIDINE GLUCONATE 4 % EX LIQD: 60.0000 mL | Freq: Once | CUTANEOUS | Status: DC

## 2016-04-21 MED ORDER — OXYCODONE HCL 5 MG PO TABS
5.0000 mg | ORAL_TABLET | ORAL | Status: DC | PRN
  Administered 2016-04-21 – 2016-04-24 (×7): 10 mg via ORAL
  Filled 2016-04-21 (×6): qty 2

## 2016-04-21 MED ORDER — ACETAMINOPHEN 325 MG PO TABS: 650.0000 mg | ORAL_TABLET | Freq: Four times a day (QID) | ORAL | Status: DC | PRN

## 2016-04-21 MED ORDER — METOCLOPRAMIDE HCL 5 MG/ML IJ SOLN: 10.0000 mg | Freq: Once | INTRAMUSCULAR | Status: DC | PRN

## 2016-04-21 MED ORDER — OXYCODONE HCL 5 MG PO TABS
ORAL_TABLET | ORAL | Status: AC
  Administered 2016-04-21: 10 mg via ORAL
  Filled 2016-04-21: qty 2

## 2016-04-21 MED ORDER — LACTATED RINGERS IV SOLN
INTRAVENOUS | Status: DC
  Administered 2016-04-21 (×2): via INTRAVENOUS

## 2016-04-21 MED ORDER — MORPHINE SULFATE (PF) 4 MG/ML IV SOLN
INTRAVENOUS | Status: AC
  Filled 2016-04-21: qty 1

## 2016-04-21 MED ORDER — HYDROMORPHONE HCL 1 MG/ML IJ SOLN
INTRAMUSCULAR | Status: AC
  Administered 2016-04-21: 0.5 mg via INTRAVENOUS
  Filled 2016-04-21: qty 2

## 2016-04-21 MED ORDER — SUCCINYLCHOLINE CHLORIDE 20 MG/ML IJ SOLN
INTRAMUSCULAR | Status: DC | PRN
  Administered 2016-04-21: 140 mg via INTRAVENOUS

## 2016-04-21 MED ORDER — DIPHENHYDRAMINE HCL 12.5 MG/5ML PO ELIX: 12.5000 mg | ORAL_SOLUTION | ORAL | Status: DC | PRN

## 2016-04-21 MED ORDER — HYDROCHLOROTHIAZIDE 12.5 MG PO CAPS
12.5000 mg | ORAL_CAPSULE | Freq: Every day | ORAL | Status: DC
  Administered 2016-04-22 – 2016-04-24 (×3): 12.5 mg via ORAL
  Filled 2016-04-21 (×3): qty 1

## 2016-04-21 MED ORDER — PHENYLEPHRINE 40 MCG/ML (10ML) SYRINGE FOR IV PUSH (FOR BLOOD PRESSURE SUPPORT)
PREFILLED_SYRINGE | INTRAVENOUS | Status: AC
  Filled 2016-04-21: qty 10

## 2016-04-21 MED ORDER — ROCURONIUM BROMIDE 100 MG/10ML IV SOLN
INTRAVENOUS | Status: DC | PRN
  Administered 2016-04-21: 50 mg via INTRAVENOUS

## 2016-04-21 MED ORDER — LIDOCAINE HCL (CARDIAC) 20 MG/ML IV SOLN
INTRAVENOUS | Status: DC | PRN
  Administered 2016-04-21: 100 mg via INTRAVENOUS

## 2016-04-21 MED ORDER — SODIUM CHLORIDE 0.9 % IR SOLN
Status: DC | PRN
  Administered 2016-04-21: 3000 mL

## 2016-04-21 MED ORDER — CEFAZOLIN SODIUM-DEXTROSE 2-4 GM/100ML-% IV SOLN
2.0000 g | Freq: Four times a day (QID) | INTRAVENOUS | Status: AC
  Administered 2016-04-21 – 2016-04-22 (×2): 2 g via INTRAVENOUS
  Filled 2016-04-21 (×2): qty 100

## 2016-04-21 MED ORDER — LOSARTAN POTASSIUM 50 MG PO TABS
50.0000 mg | ORAL_TABLET | Freq: Every day | ORAL | Status: DC
  Administered 2016-04-22 – 2016-04-24 (×3): 50 mg via ORAL
  Filled 2016-04-21 (×3): qty 1

## 2016-04-21 MED ORDER — SUGAMMADEX SODIUM 200 MG/2ML IV SOLN
INTRAVENOUS | Status: DC | PRN
  Administered 2016-04-21: 230 mg via INTRAVENOUS

## 2016-04-21 MED ORDER — ONDANSETRON HCL 4 MG PO TABS: 4.0000 mg | ORAL_TABLET | Freq: Four times a day (QID) | ORAL | Status: DC | PRN

## 2016-04-21 MED ORDER — EPHEDRINE 5 MG/ML INJ
INTRAVENOUS | Status: AC
  Filled 2016-04-21: qty 10

## 2016-04-21 MED ORDER — METHOCARBAMOL 1000 MG/10ML IJ SOLN
500.0000 mg | Freq: Four times a day (QID) | INTRAVENOUS | Status: DC | PRN
  Administered 2016-04-21: 500 mg via INTRAVENOUS
  Filled 2016-04-21 (×2): qty 5

## 2016-04-21 MED ORDER — LOSARTAN POTASSIUM-HCTZ 50-12.5 MG PO TABS: 1.0000 | ORAL_TABLET | Freq: Every day | ORAL | Status: DC

## 2016-04-21 MED ORDER — FENTANYL CITRATE (PF) 250 MCG/5ML IJ SOLN
INTRAMUSCULAR | Status: AC
  Filled 2016-04-21: qty 5

## 2016-04-21 MED ORDER — METOCLOPRAMIDE HCL 5 MG/ML IJ SOLN
5.0000 mg | Freq: Three times a day (TID) | INTRAMUSCULAR | Status: DC | PRN
Start: 2016-04-21 — End: 2016-04-24

## 2016-04-21 MED ORDER — SUGAMMADEX SODIUM 200 MG/2ML IV SOLN
INTRAVENOUS | Status: AC
  Filled 2016-04-21: qty 2

## 2016-04-21 MED ORDER — KETOROLAC TROMETHAMINE 15 MG/ML IJ SOLN
15.0000 mg | Freq: Four times a day (QID) | INTRAMUSCULAR | Status: DC
  Administered 2016-04-21 – 2016-04-24 (×11): 15 mg via INTRAVENOUS
  Filled 2016-04-21 (×10): qty 1

## 2016-04-21 MED ORDER — ALUM & MAG HYDROXIDE-SIMETH 200-200-20 MG/5ML PO SUSP: 30.0000 mL | ORAL | Status: DC | PRN

## 2016-04-21 MED ORDER — ZOLPIDEM TARTRATE 5 MG PO TABS: 5.0000 mg | ORAL_TABLET | Freq: Every evening | ORAL | Status: DC | PRN

## 2016-04-21 MED ORDER — METOCLOPRAMIDE HCL 5 MG PO TABS: 5.0000 mg | ORAL_TABLET | Freq: Three times a day (TID) | ORAL | Status: DC | PRN

## 2016-04-21 MED ORDER — SODIUM CHLORIDE 0.9 % IV SOLN: INTRAVENOUS | Status: DC

## 2016-04-21 MED ORDER — MIDAZOLAM HCL 5 MG/5ML IJ SOLN
INTRAMUSCULAR | Status: DC | PRN
  Administered 2016-04-21: 2 mg via INTRAVENOUS

## 2016-04-21 MED ORDER — MENTHOL 3 MG MT LOZG: 1.0000 | LOZENGE | OROMUCOSAL | Status: DC | PRN

## 2016-04-21 MED ORDER — HYDROMORPHONE HCL 1 MG/ML IJ SOLN: 1.0000 mg | INTRAMUSCULAR | Status: DC | PRN

## 2016-04-21 MED ORDER — 0.9 % SODIUM CHLORIDE (POUR BTL) OPTIME
TOPICAL | Status: DC | PRN
  Administered 2016-04-21: 1000 mL

## 2016-04-21 MED ORDER — ONDANSETRON HCL 4 MG/2ML IJ SOLN: 4.0000 mg | Freq: Four times a day (QID) | INTRAMUSCULAR | Status: DC | PRN

## 2016-04-21 MED ORDER — PHENOL 1.4 % MT LIQD: 1.0000 | OROMUCOSAL | Status: DC | PRN

## 2016-04-21 MED ORDER — HYDROMORPHONE HCL 1 MG/ML IJ SOLN
0.5000 mg | INTRAMUSCULAR | Status: AC | PRN
  Administered 2016-04-21 (×4): 0.5 mg via INTRAVENOUS

## 2016-04-21 MED ORDER — ACETAMINOPHEN 10 MG/ML IV SOLN
INTRAVENOUS | Status: AC
  Filled 2016-04-21: qty 100

## 2016-04-21 MED ORDER — LIDOCAINE 2% (20 MG/ML) 5 ML SYRINGE
INTRAMUSCULAR | Status: AC
  Filled 2016-04-21: qty 5

## 2016-04-21 MED ORDER — ACETAMINOPHEN 650 MG RE SUPP: 650.0000 mg | Freq: Four times a day (QID) | RECTAL | Status: DC | PRN

## 2016-04-21 MED ORDER — MEPERIDINE HCL 25 MG/ML IJ SOLN: 6.2500 mg | INTRAMUSCULAR | Status: DC | PRN

## 2016-04-21 MED ORDER — FENTANYL CITRATE (PF) 100 MCG/2ML IJ SOLN
INTRAMUSCULAR | Status: DC | PRN
  Administered 2016-04-21 (×2): 50 ug via INTRAVENOUS
  Administered 2016-04-21: 150 ug via INTRAVENOUS
  Administered 2016-04-21 (×5): 50 ug via INTRAVENOUS

## 2016-04-21 MED ORDER — ONDANSETRON HCL 4 MG/2ML IJ SOLN
INTRAMUSCULAR | Status: AC
  Filled 2016-04-21: qty 2

## 2016-04-21 MED ORDER — ONDANSETRON HCL 4 MG/2ML IJ SOLN
INTRAMUSCULAR | Status: DC | PRN
Start: 2016-04-21 — End: 2016-04-21
  Administered 2016-04-21: 4 mg via INTRAVENOUS

## 2016-04-21 SURGICAL SUPPLY — 51 items
APL SKNCLS STERI-STRIP NONHPOA (GAUZE/BANDAGES/DRESSINGS) ×1
BENZOIN TINCTURE PRP APPL 2/3 (GAUZE/BANDAGES/DRESSINGS) ×2 IMPLANT
BLADE SAW SGTL 18X1.27X75 (BLADE) ×2 IMPLANT
BLADE SURG ROTATE 9660 (MISCELLANEOUS) IMPLANT
CAPT HIP TOTAL 2 ×2 IMPLANT
CELLS DAT CNTRL 66122 CELL SVR (MISCELLANEOUS) ×1 IMPLANT
COVER SURGICAL LIGHT HANDLE (MISCELLANEOUS) ×2 IMPLANT
DRAPE C-ARM 42X72 X-RAY (DRAPES) ×2 IMPLANT
DRAPE STERI IOBAN 125X83 (DRAPES) ×2 IMPLANT
DRAPE U-SHAPE 47X51 STRL (DRAPES) ×6 IMPLANT
DRSG AQUACEL AG ADV 3.5X10 (GAUZE/BANDAGES/DRESSINGS) ×2 IMPLANT
DURAPREP 26ML APPLICATOR (WOUND CARE) ×2 IMPLANT
ELECT BLADE 4.0 EZ CLEAN MEGAD (MISCELLANEOUS) ×2
ELECT BLADE 6.5 EXT (BLADE) IMPLANT
ELECT REM PT RETURN 9FT ADLT (ELECTROSURGICAL) ×2
ELECTRODE BLDE 4.0 EZ CLN MEGD (MISCELLANEOUS) ×1 IMPLANT
ELECTRODE REM PT RTRN 9FT ADLT (ELECTROSURGICAL) ×1 IMPLANT
FACESHIELD WRAPAROUND (MASK) ×6 IMPLANT
GAUZE XEROFORM 1X8 LF (GAUZE/BANDAGES/DRESSINGS) ×2 IMPLANT
GLOVE BIOGEL PI IND STRL 8 (GLOVE) ×2 IMPLANT
GLOVE BIOGEL PI INDICATOR 8 (GLOVE) ×2
GLOVE ECLIPSE 8.0 STRL XLNG CF (GLOVE) ×2 IMPLANT
GLOVE ORTHO TXT STRL SZ7.5 (GLOVE) ×4 IMPLANT
GOWN STRL REUS W/ TWL LRG LVL3 (GOWN DISPOSABLE) ×2 IMPLANT
GOWN STRL REUS W/ TWL XL LVL3 (GOWN DISPOSABLE) ×2 IMPLANT
GOWN STRL REUS W/TWL LRG LVL3 (GOWN DISPOSABLE) ×4
GOWN STRL REUS W/TWL XL LVL3 (GOWN DISPOSABLE) ×4
HANDPIECE INTERPULSE COAX TIP (DISPOSABLE) ×1
KIT BASIN OR (CUSTOM PROCEDURE TRAY) ×2 IMPLANT
KIT ROOM TURNOVER OR (KITS) ×2 IMPLANT
MANIFOLD NEPTUNE II (INSTRUMENTS) ×2 IMPLANT
NS IRRIG 1000ML POUR BTL (IV SOLUTION) ×2 IMPLANT
PACK TOTAL JOINT (CUSTOM PROCEDURE TRAY) ×2 IMPLANT
PAD ARMBOARD 7.5X6 YLW CONV (MISCELLANEOUS) ×4 IMPLANT
RTRCTR WOUND ALEXIS 18CM MED (MISCELLANEOUS) ×2
SET HNDPC FAN SPRY TIP SCT (DISPOSABLE) ×1 IMPLANT
SPONGE LAP 18X18 X RAY DECT (DISPOSABLE) ×2 IMPLANT
STAPLER VISISTAT 35W (STAPLE) ×4 IMPLANT
STRIP CLOSURE SKIN 1/2X4 (GAUZE/BANDAGES/DRESSINGS) IMPLANT
SUT ETHIBOND NAB CT1 #1 30IN (SUTURE) ×2 IMPLANT
SUT MNCRL AB 4-0 PS2 18 (SUTURE) IMPLANT
SUT VIC AB 0 CT1 27 (SUTURE) ×2
SUT VIC AB 0 CT1 27XBRD ANBCTR (SUTURE) ×1 IMPLANT
SUT VIC AB 1 CT1 27 (SUTURE) ×2
SUT VIC AB 1 CT1 27XBRD ANBCTR (SUTURE) ×1 IMPLANT
SUT VIC AB 2-0 CT1 27 (SUTURE) ×4
SUT VIC AB 2-0 CT1 TAPERPNT 27 (SUTURE) ×2 IMPLANT
TOWEL OR 17X24 6PK STRL BLUE (TOWEL DISPOSABLE) ×2 IMPLANT
TOWEL OR 17X26 10 PK STRL BLUE (TOWEL DISPOSABLE) ×2 IMPLANT
TRAY FOLEY CATH 16FRSI W/METER (SET/KITS/TRAYS/PACK) ×2 IMPLANT
WATER STERILE IRR 1000ML POUR (IV SOLUTION) IMPLANT

## 2016-04-21 NOTE — Anesthesia Procedure Notes (Signed)
Procedure Name: Intubation Date/Time: 04/21/2016 2:23 PM Performed by: Lance Coon Pre-anesthesia Checklist: Patient identified, Emergency Drugs available, Suction available, Patient being monitored and Timeout performed Patient Re-evaluated:Patient Re-evaluated prior to inductionPreoxygenation: Pre-oxygenation with 100% oxygen Intubation Type: IV induction Ventilation: Mask ventilation without difficulty Laryngoscope Size: Miller and 2 Grade View: Grade II Tube type: Oral Tube size: 7.0 mm Number of attempts: 1 Airway Equipment and Method: Stylet Placement Confirmation: ETT inserted through vocal cords under direct vision,  positive ETCO2 and breath sounds checked- equal and bilateral Secured at: 21 cm Tube secured with: Tape Dental Injury: Teeth and Oropharynx as per pre-operative assessment

## 2016-04-21 NOTE — H&P (Signed)
TOTAL HIP ADMISSION H&P  Patient is admitted for left total hip arthroplasty.  Subjective:  Chief Complaint: left hip pain  HPI: Carol Crosby, 55 y.o. female, has a history of pain and functional disability in the left hip(s) due to arthritis and patient has failed non-surgical conservative treatments for greater than 12 weeks to include NSAID's and/or analgesics, corticosteriod injections, flexibility and strengthening excercises, use of assistive devices, weight reduction as appropriate and activity modification.  Onset of symptoms was abrupt starting 1 years ago with rapidlly worsening course since that time.The patient noted no past surgery on the left hip(s).  Patient currently rates pain in the left hip at 10 out of 10 with activity. Patient has night pain, worsening of pain with activity and weight bearing, trendelenberg gait, pain that interfers with activities of daily living and pain with passive range of motion. Patient has evidence of subchondral sclerosis, periarticular osteophytes and joint space narrowing by imaging studies. This condition presents safety issues increasing the risk of falls.  There is no current active infection.  Patient Active Problem List   Diagnosis Date Noted  . Osteoarthritis of left hip 04/21/2016  . Viral URI with cough 12/31/2015  . Chronic insomnia 12/31/2015  . Acute sinusitis 11/20/2015  . Lipoma 12/10/2014  . Right shoulder pain 12/10/2014  . Severe headache 01/23/2014  . HTN (hypertension), benign 01/09/2014  . Right knee pain 01/09/2014  . Throat irritation 01/09/2014  . OBSTRUCTIVE SLEEP APNEA 01/14/2009  . Morbid obesity ([place]) 11/30/2007  . 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 Other chest pain   . Morbid obesity ([place])   . Obstructive sleep apnea (adult) (pediatric)   . Hypersomnia, unspecified   . Excessive or frequent menstruation   . Abdominal pain, right upper quadrant   . Acute  sinusitis, unspecified   . Streptococcal sore throat   . Mixed incontinence urge and stress (female)(female)   . Unspecified urinary incontinence   . Esophageal reflux   . Temporomandibular joint disorders, unspecified   . Scarlet fever   . Palpitations   . Hypertension   . History of bronchitis   . Arthritis     Past Surgical History  Procedure Laterality Date  . Temporomandibular joint surgery  1981  . Cholecystectomy    . Tonsillectomy    . Extremity cyst excision  2000    Wrist  . Refractive surgery  1997  . Cesarean section  2005    No prescriptions prior to admission   Allergies  Allergen Reactions  . Erythromycin Base     REACTION: Nausea and vomiting    Social History  Substance Use Topics  . Smoking status: Former Smoker    Quit date: 10/26/2001  . Smokeless tobacco: Never Used  . Alcohol Use: No    Family History  Problem Relation Age of Onset  . Diabetes Father   . Migraines Mother   . Hypertension Mother   . Fibromyalgia Sister   . Colon cancer Maternal Grandmother   . Other Brother     ? Heart issue     Review of Systems  Musculoskeletal: Positive for joint pain.  All other systems reviewed and are negative.   Objective:  Physical Exam  Constitutional: She is oriented to person, place, and time. She appears well-developed and well-nourished.  HENT:  Head: Normocephalic and atraumatic.  Eyes: EOM are normal. Pupils are equal, round, and reactive to light.  Neck: Normal range of  motion. Neck supple.  Cardiovascular: Normal rate and regular rhythm.   Respiratory: Effort normal and breath sounds normal.  GI: Soft. Bowel sounds are normal.  Musculoskeletal:       Left hip: She exhibits decreased range of motion, decreased strength, tenderness and bony tenderness.  Neurological: She is alert and oriented to person, place, and time.  Skin: Skin is warm and dry.  Psychiatric: She has a normal mood and affect.    Vital signs in last 24 hours:     Labs:   Estimated body mass index is 48.00 kg/(m^2) as calculated from the following:   Height as of 01/24/16: 5\' 2"  (1.575 m).   Weight as of 01/24/16: 119.069 kg (262 lb 8 oz).   Imaging Review Plain radiographs demonstrate moderate degenerative joint disease of the left hip(s). The bone quality appears to be excellent for age and reported activity level.  Assessment/Plan:  End stage arthritis, left hip(s)  The patient history, physical examination, clinical judgement of the provider and imaging studies are consistent with end stage degenerative joint disease of the left hip(s) and total hip arthroplasty is deemed medically necessary. The treatment options including medical management, injection therapy, arthroscopy and arthroplasty were discussed at length. The risks and benefits of total hip arthroplasty were presented and reviewed. The risks due to aseptic loosening, infection, stiffness, dislocation/subluxation,  thromboembolic complications and other imponderables were discussed.  The patient acknowledged the explanation, agreed to proceed with the plan and consent was signed. Patient is being admitted for inpatient treatment for surgery, pain control, PT, OT, prophylactic antibiotics, VTE prophylaxis, progressive ambulation and ADL's and discharge planning.The patient is planning to be discharged home with home health services

## 2016-04-21 NOTE — Anesthesia Postprocedure Evaluation (Signed)
Anesthesia Post Note  Patient: Carol Crosby  Procedure(s) Performed: Procedure(s) (LRB): LEFT TOTAL HIP ARTHROPLASTY ANTERIOR APPROACH (Left)  Patient location during evaluation: PACU Anesthesia Type: General Level of consciousness: awake and alert and oriented Pain management: pain level controlled Vital Signs Assessment: post-procedure vital signs reviewed and stable Respiratory status: spontaneous breathing, nonlabored ventilation, respiratory function stable and patient connected to nasal cannula oxygen Cardiovascular status: blood pressure returned to baseline and stable Postop Assessment: no signs of nausea or vomiting Anesthetic complications: no    Last Vitals:  Filed Vitals:   04/21/16 1649 04/21/16 1704  BP: 147/64 116/67  Pulse: 84 83  Temp:    Resp: 17 18    Last Pain:  Filed Vitals:   04/21/16 1718  PainSc: 8                  Billijo Dilling A.

## 2016-04-21 NOTE — Brief Op Note (Signed)
04/21/2016  3:46 PM  PATIENT:  Carol Crosby  55 y.o. female  PRE-OPERATIVE DIAGNOSIS:  Left hip avascular necrosis  POST-OPERATIVE DIAGNOSIS:  Left hip avascular necrosis  PROCEDURE:  Procedure(s): LEFT TOTAL HIP ARTHROPLASTY ANTERIOR APPROACH (Left)  SURGEON:  Surgeon(s) and Role:    * Mcarthur Rossetti, MD - Primary  PHYSICIAN ASSISTANT: Benita Stabile, PA-C  ANESTHESIA:   general  EBL:  Total I/O In: 1000 [I.V.:1000] Out: 300 [Urine:100; Blood:200]  COUNTS:  YES  DICTATION: .Other Dictation: Dictation Number [id number]  PLAN OF CARE: Admit to inpatient   PATIENT DISPOSITION:  PACU - hemodynamically stable.   Delay start of Pharmacological VTE agent (>24hrs) due to surgical blood loss or risk of bleeding: no

## 2016-04-21 NOTE — Transfer of Care (Signed)
Immediate Anesthesia Transfer of Care Note  Patient: Carol Crosby  Procedure(s) Performed: Procedure(s): LEFT TOTAL HIP ARTHROPLASTY ANTERIOR APPROACH (Left)  Patient Location: PACU  Anesthesia Type:General  Level of Consciousness: awake, alert , oriented and patient cooperative  Airway & Oxygen Therapy: Patient Spontanous Breathing  Post-op Assessment: Report given to RN and Post -op Vital signs reviewed and stable  Post vital signs: Reviewed and stable  Last Vitals:  Filed Vitals:   04/21/16 1304 04/21/16 1606  BP: 155/80   Pulse: 74   Temp: 36.6 C 36.7 C  Resp: 20     Last Pain:  Filed Vitals:   04/21/16 1606  PainSc: 8       Patients Stated Pain Goal: 3 ([id number])  Complications: No apparent anesthesia complications

## 2016-04-22 ENCOUNTER — Encounter (HOSPITAL_COMMUNITY): Payer: Self-pay | Admitting: Orthopaedic Surgery

## 2016-04-22 LAB — BASIC METABOLIC PANEL
Anion gap: 7 (ref 5–15)
BUN: 13 mg/dL (ref 6–20)
CHLORIDE: 102 mmol/L (ref 101–111)
CO2: 24 mmol/L (ref 22–32)
CREATININE: 0.58 mg/dL (ref 0.44–1.00)
Calcium: 8.3 mg/dL — ABNORMAL LOW (ref 8.9–10.3)
Glucose, Bld: 126 mg/dL — ABNORMAL HIGH (ref 65–99)
Potassium: 3.6 mmol/L (ref 3.5–5.1)
SODIUM: 133 mmol/L — AB (ref 135–145)

## 2016-04-22 LAB — CBC
HCT: 34.2 % — ABNORMAL LOW (ref 36.0–46.0)
HEMOGLOBIN: 11.1 g/dL — AB (ref 12.0–15.0)
MCH: 30.1 pg (ref 26.0–34.0)
MCHC: 32.5 g/dL (ref 30.0–36.0)
MCV: 92.7 fL (ref 78.0–100.0)
PLATELETS: 218 10*3/uL (ref 150–400)
RBC: 3.69 MIL/uL — AB (ref 3.87–5.11)
RDW: 14.2 % (ref 11.5–15.5)
WBC: 8.8 10*3/uL (ref 4.0–10.5)

## 2016-04-22 NOTE — Op Note (Signed)
Carol Crosby, Carol Crosby                  ACCOUNT NO.:  [id number]  MEDICAL RECORD NO.:  [id number]  LOCATION:  5N19C                        FACILITY:  [facility]  PHYSICIAN:  Lind Guest. Ninfa Linden, M.D.DATE OF BIRTH:  05/13/1961  DATE OF PROCEDURE:  04/21/2016 DATE OF DISCHARGE:                              OPERATIVE REPORT   PREOPERATIVE DIAGNOSIS:  Primary osteoarthritis and degenerative joint disease, left hip.  POSTOPERATIVE DIAGNOSIS:  Primary osteoarthritis and degenerative joint disease, left hip.  PROCEDURE:  Left total hip arthroplasty through direct anterior approach.  IMPLANTS:  DePuy Sector Gription acetabular component size 48, size 32+ 4 neutral polyethylene liner, size 11 Corail femoral component with standard offset, size 32+ 1 ceramic hip ball.  SURGEON:  Lind Guest. Ninfa Linden, MD.  ASSISTANT:  Erskine Emery, PA-C.  ANESTHESIA:  General.  ANTIBIOTICS:  3 g IV Ancef.  BLOOD LOSS:  200 mL.  COMPLICATIONS:  None.  INDICATIONS:  Ms. Gilcrease is a very pleasant 55 year old female, who is morbidly obese.  She has had debilitating left hip pain for about a year now.  Intra-articular steroid injection helped her quite significant, but now her pain is severe.  She has happened to ambulate on crutches. Internal and external rotation causes her severe pain as well.  I did obtain plain films and MRI of the hip.  This showed only mild arthritis per the MRI report but to me, it looked definitely more severe.  With continued severity of her pain and inability to get this pain calmed down with the conservative treatment, we opted to proceed with a total hip arthroplasty.  She understands the risk of acute blood loss anemia, nerve and vessel injury, fracture, infection, dislocation, DVT.  She understands our goals are decreased pain, improved mobility, and overall improved quality of life.  PROCEDURE DESCRIPTION:  After informed consent was obtained, appropriate left hip was  marked.  She was brought to the operating room.  General anesthesia was obtained while she was on her stretcher.  A Foley catheter was placed and both feet had traction boots applied to them. Next, she was placed supine on the Hana fracture table with the perineal post in place and both legs in inline skeletal traction devices, but no traction applied.  Her left operative hip was prepped and draped with DuraPrep and sterile drapes.  Time-out was called.  She was identified as correct patient and correct left hip.  I then made an incision inferior and posterior to the anterosuperior iliac spine and carried this obliquely down the leg.  We dissected down the tensor fascia lata muscle and tensor fascia was then divided longitudinally, so we could proceed with a direct anterior approach to the hip.  We identified and cauterized the circumflex vessels and identified the hip capsule.  I opened up the hip capsule in an L-type format finding a quite large joint effusion.  We also found severe synovitis and significant periarticular osteophytes.  We were able to place our Cobra retractors within joint capsule and we used oscillating saw to make our femoral neck cut proximal to the lesser trochanter and completed this on osteotome.  We placed a corkscrew guide in the femoral  head and removed the femoral head its entirety and found a large section completely devoid of cartilage.  This was very satisfying given the amount of pain she was having and it definitely correlates with the pain she was having.  We passed this off the back table.  We then placed a bent Hohmann over the medial acetabular rim and cleaned the rim, the acetabular remnants of acetabular labrum.  We then began reaming under direct visualization from a size 42 reamer up to a size 48 with all reamers under direct visualization.  The last reamer under direct fluoroscopy as well so we could obtain our depth of reaming, my inclination  and anteversion.  Once we were pleased with this, we placed the real DePuy Sector Gription acetabular component size 48 and I placed a 32+ 4 neutral polyethylene liner for that size acetabular component. Attention was then turned to the femur.  With the leg externally rotated to 120 degrees, extended and adducted, we were able to bring the leg down and over and with a Mueller retractor, we were able to extend and abduct the hip.  We then placed a Mueller retractor medially and a Hohmann retractor behind the greater trochanter.  I released the lateral joint capsule and used a box cutting osteotome to enter femoral canal and a rongeur to lateralize.  We then began broaching from a size 8 broach using the Corail broaching system up to a size 11.  With the size 11, we trialed a standard offset femoral neck and a 32+ 1 hip ball.  We brought the leg back over and up with traction and rotation reducing the pelvis.  We were pleased with leg length, offset, and stability.  We then dislocated the hip and removed the trial components.  We placed the real Corail femoral component size 11 with standard offset and real 32+ 1 ceramic hip ball and reduced this in acetabulum.  It was stable.  We then irrigated the soft tissues with normal saline solution using pulsatile lavage.  We were able to then close the joint capsule with interrupted #1 Ethibond suture followed by a running #1 Vicryl in the tensor fascia, 0 Vicryl in the deep tissue, 2-0 Vicryl in subcutaneous tissue, and interrupted staples on the skin.  Xeroform and Aquacel dressing was applied.  She was taken off the Hana table, awakened, extubated, and taken to the recovery room in stable condition.  All final counts were correct.  There were no complications noted.  Of note, Erskine Emery, PA-C assisted in the entire case, his assistance was crucial for facilitating all aspects of this case.     Lind Guest. Ninfa Linden,  M.D.     CYB/MEDQ  D:  04/21/2016  T:  04/22/2016  Job:  [id number]

## 2016-04-22 NOTE — Progress Notes (Signed)
Physical Therapy Treatment Patient Details Name: Carol Crosby MRN: [id number] DOB: 1961-08-19 Today's Date: 04/22/2016    History of Present Illness pt is a 55 y/o female with h/o L hip OA, HTN, R knee pain, OSA, admitted with L hip pain over the last year, s/p LTHA using direct anterior approach.    PT Comments    Progressing as expected today.  Initiated most of HEP and ambulated in the hallway 90'.  Pt also performed well getting into bed.  Follow Up Recommendations  Home health PT     Equipment Recommendations       Recommendations for Other Services       Precautions / Restrictions Precautions Precautions: Anterior Hip;Other (comment) Restrictions LLE Weight Bearing: Weight bearing as tolerated    Mobility  Bed Mobility Overal bed mobility: Needs Assistance Bed Mobility: Sit to Supine       Sit to supine: Min assist   General bed mobility comments: pt did well with pulling herself into bed and bridging to middle of mattress, but needed assist with L LE.  Transfers Overall transfer level: Needs assistance Equipment used: Rolling walker (2 wheeled) Transfers: Sit to/from Stand Sit to Stand: Min guard         General transfer comment: cues for hand placement and stability assist  Ambulation/Gait Ambulation/Gait assistance: Min guard Ambulation Distance (Feet): 90 Feet Assistive device: Rolling walker (2 wheeled) Gait Pattern/deviations: Step-to pattern Gait velocity: slow Gait velocity interpretation: Below normal speed for age/gender General Gait Details: step to minimally antalgic gait   Stairs            Wheelchair Mobility    Modified Rankin (Stroke Patients Only)       Balance Overall balance assessment: No apparent balance deficits (not formally assessed)                                  Cognition Arousal/Alertness: Awake/alert Behavior During Therapy: WFL for tasks assessed/performed Overall Cognitive Status:  Within Functional Limits for tasks assessed                      Exercises Total Joint Exercises Ankle Circles/Pumps: AROM;20 reps;Seated Heel Slides: AAROM;Left;10 reps;Seated (resisted gross extension)    General Comments        Pertinent Vitals/Pain Pain Assessment: Faces Faces Pain Scale: Hurts even more Pain Location: groin hip Pain Descriptors / Indicators: Aching;Sore Pain Intervention(s): Monitored during session;Repositioned    Home Living Family/patient expects to be discharged to:: Private residence Living Arrangements: Spouse/significant other;Children                  Prior Function            PT Goals (current goals can now be found in the care plan section) Acute Rehab PT Goals Patient Stated Goal: back to work PT Goal Formulation: With patient Time For Goal Achievement: 04/29/16 Potential to Achieve Goals: Good Progress towards PT goals: Progressing toward goals    Frequency  7X/week    PT Plan Current plan remains appropriate    Co-evaluation             End of Session   Activity Tolerance: Patient tolerated treatment well Patient left: in bed;with call bell/phone within reach     Time: 1728-1755 PT Time Calculation (min) (ACUTE ONLY): 27 min  Charges:  $Gait Training: 8-22 mins $Therapeutic Exercise: 23-37 mins  G Codes:      Walda Hertzog, Tessie Fass 04/22/2016, 5:58 PM 04/22/2016  Donnella Sham, [place] [phone]  (pager)

## 2016-04-22 NOTE — Progress Notes (Signed)
Subjective: 1 Day Post-Op Procedure(s) (LRB): LEFT TOTAL HIP ARTHROPLASTY ANTERIOR APPROACH (Left) Patient reports pain as moderate.  Vitals stable.  Labs not back yet.  Objective: Vital signs in last 24 hours: Temp:  [97.6 F (36.4 C)-98.2 F (36.8 C)] 97.8 F (36.6 C) (06/28 0400) Pulse Rate:  [74-95] 93 (06/28 0400) Resp:  [11-20] 16 (06/28 0400) BP: (93-155)/(55-80) 134/71 mmHg (06/28 0400) SpO2:  [89 %-98 %] 98 % (06/28 0400) Weight:  [114.306 kg (252 lb)] 114.306 kg (252 lb) (06/27 1304)  Intake/Output from previous day: 06/27 0701 - 06/28 0700 In: 1620 [P.O.:120; I.V.:1500] Out: 800 [Urine:600; Blood:200] Intake/Output this shift: Total I/O In: 120 [P.O.:120] Out: 500 [Urine:500]  No results for input(s): HGB in the last 72 hours. No results for input(s): WBC, RBC, HCT, PLT in the last 72 hours. No results for input(s): NA, K, CL, CO2, BUN, CREATININE, GLUCOSE, CALCIUM in the last 72 hours. No results for input(s): LABPT, INR in the last 72 hours.  Sensation intact distally Intact pulses distally Dorsiflexion/Plantar flexion intact Incision: dressing C/D/I  Assessment/Plan: 1 Day Post-Op Procedure(s) (LRB): LEFT TOTAL HIP ARTHROPLASTY ANTERIOR APPROACH (Left) Up with therapy  BLACKMAN,CHRISTOPHER Y 04/22/2016, 6:58 AM

## 2016-04-22 NOTE — Progress Notes (Signed)
Patient has CPAP in the room that she wore last night. Patient does not wish to wear CPAP tonight. RT instructed patient to call if she changes her mind.

## 2016-04-22 NOTE — Evaluation (Signed)
Physical Therapy Evaluation Patient Details Name: Carol Crosby MRN: [id number] DOB: 02/28/1961 Today's Date: 04/22/2016   History of Present Illness  pt is a 55 y/o female with h/o L hip OA, HTN, R knee pain, OSA, admitted with L hip pain over the last year, s/p LTHA using direct anterior approach.  Clinical Impression  Pt admitted with/for L THA.  Pt currently limited functionally due to the problems listed below.  (see problems list.)  Pt will benefit from PT to maximize function and safety to be able to get home safely with available assist of family.     Follow Up Recommendations Home health PT    Equipment Recommendations   (maybe 3 in 1 per OT decision)    Recommendations for Other Services       Precautions / Restrictions Precautions Precautions: Anterior Hip;Other (comment) (direct) Restrictions Weight Bearing Restrictions: Yes LLE Weight Bearing: Weight bearing as tolerated      Mobility  Bed Mobility Overal bed mobility: Needs Assistance Bed Mobility: Supine to Sit     Supine to sit: Min assist     General bed mobility comments: Bridged to EOB with min and up to EOB with minimal assist  Transfers Overall transfer level: Needs assistance   Transfers: Sit to/from Stand;Stand Pivot Transfers Sit to Stand: Min assist Stand pivot transfers: Min assist       General transfer comment: cues for hand placement and stability assist  Ambulation/Gait Ambulation/Gait assistance: Min guard Ambulation Distance (Feet): 15 Feet (then 25 feet) Assistive device: Rolling walker (2 wheeled) Gait Pattern/deviations: Step-through pattern   Gait velocity interpretation: Below normal speed for age/gender General Gait Details: step to minimally antalgic gait  Stairs            Wheelchair Mobility    Modified Rankin (Stroke Patients Only)       Balance Overall balance assessment: Needs assistance Sitting-balance support: No upper extremity supported;Feet  supported Sitting balance-Leahy Scale: Normal     Standing balance support: No upper extremity supported Standing balance-Leahy Scale: Good                               Pertinent Vitals/Pain Pain Assessment: 0-10 Pain Score: 6  Pain Location: hip incision Pain Descriptors / Indicators: Burning;Sore Pain Intervention(s): Limited activity within patient's tolerance    Home Living Family/patient expects to be discharged to:: Private residence Living Arrangements: Spouse/significant other;Children Available Help at Discharge: Family;Available 24 hours/day (55 y/o during summer) Type of Home: House Home Access: Stairs to enter Entrance Stairs-Rails: None Entrance Stairs-Number of Steps: 2 Home Layout: One level Home Equipment: Environmental consultant - 2 wheels;Shower seat      Prior Function Level of Independence: Independent               Hand Dominance        Extremity/Trunk Assessment   Upper Extremity Assessment: Defer to OT evaluation           Lower Extremity Assessment: LLE deficits/detail   LLE Deficits / Details: stiff and weak with pain     Communication   Communication: No difficulties  Cognition Arousal/Alertness: Awake/alert Behavior During Therapy: WFL for tasks assessed/performed Overall Cognitive Status: Within Functional Limits for tasks assessed                      General Comments General comments (skin integrity, edema, etc.): Initiated HEP and discussed Direct ant approach  Exercises Total Joint Exercises Ankle Circles/Pumps: AROM;20 reps;Seated Quad Sets: AROM;15 reps;Seated Heel Slides: AAROM;Left;10 reps;Seated Bridges: AROM;Right;5 reps      Assessment/Plan    PT Assessment Patient needs continued PT services  PT Diagnosis Acute pain   PT Problem List Decreased strength;Decreased activity tolerance;Decreased mobility;Decreased knowledge of use of DME;Pain  PT Treatment Interventions Gait training;DME  instruction;Stair training;Functional mobility training;Therapeutic activities;Patient/family education   PT Goals (Current goals can be found in the Care Plan section) Acute Rehab PT Goals Patient Stated Goal: back to work PT Goal Formulation: With patient Time For Goal Achievement: 04/29/16 Potential to Achieve Goals: Good    Frequency 7X/week   Barriers to discharge        Co-evaluation               End of Session   Activity Tolerance: Patient tolerated treatment well Patient left: in chair;with call bell/phone within reach Nurse Communication: Mobility status         Time: [id number] PT Time Calculation (min) (ACUTE ONLY): 38 min   Charges:   PT Evaluation $PT Eval Moderate Complexity: 1 Procedure PT Treatments $Gait Training: 8-22 mins $Therapeutic Activity: 8-22 mins   PT G Codes:        Sakara Lehtinen, Tessie Fass 04/22/2016, 11:08 AM 04/22/2016  Donnella Sham, PT [phone]  (pager)

## 2016-04-23 ENCOUNTER — Inpatient Hospital Stay (HOSPITAL_COMMUNITY): Payer: BC Managed Care – PPO

## 2016-04-23 DIAGNOSIS — M79609 Pain in unspecified limb: Secondary | ICD-10-CM

## 2016-04-23 NOTE — Progress Notes (Signed)
Physical Therapy Treatment Patient Details Name: Carol Crosby MRN: [id number] DOB: March 27, 1961 Today's Date: 04/23/2016    History of Present Illness pt is a 55 y/o female with h/o L hip OA, HTN, R knee pain, OSA, admitted with L hip pain over the last year, s/p LTHA using direct anterior approach.    PT Comments    Pt performed increased gait distance and progression to standing exercises.  Will f/u this pm to issue and review HEP, and perform stair training.  Pt reports she is to d/c tomorrow.    Follow Up Recommendations  Home health PT     Equipment Recommendations  3in1 (PT)    Recommendations for Other Services       Precautions / Restrictions Precautions Precautions: Anterior Hip;Other (comment) (direct) Restrictions Weight Bearing Restrictions: Yes LLE Weight Bearing: Weight bearing as tolerated    Mobility  Bed Mobility Overal bed mobility: Modified Independent Bed Mobility: Supine to Sit     Supine to sit: Supervision     General bed mobility comments: Pt received in recliner on arrival.    Transfers Overall transfer level: Modified independent Equipment used: Rolling walker (2 wheeled) Transfers: Sit to/from Stand Sit to Stand: Modified independent (Device/Increase time) Stand pivot transfers: Modified independent (Device/Increase time)       General transfer comment: demonstrated good technique.  Cues provided for LLE advancement to decrease pain during transition.    Ambulation/Gait Ambulation/Gait assistance: Min guard Ambulation Distance (Feet): 212 Feet Assistive device: Rolling walker (2 wheeled) Gait Pattern/deviations: Step-to pattern;Step-through pattern;Antalgic;Decreased stance time - left;Decreased step length - right;Trunk flexed Gait velocity: slow   General Gait Details: Cues for upright posture and forward gaze.  Pt circumducts L hip due to quad weakness.  Cues for L hip/knee flexion in swing phase.     Stairs             Wheelchair Mobility    Modified Rankin (Stroke Patients Only)       Balance Overall balance assessment: No apparent balance deficits (not formally assessed)   Sitting balance-Leahy Scale: Normal     Standing balance support: No upper extremity supported (stood at sink for 4-5 min to wash hands and brush teeth) Standing balance-Leahy Scale: Good                      Cognition Arousal/Alertness: Awake/alert Behavior During Therapy: WFL for tasks assessed/performed Overall Cognitive Status: Within Functional Limits for tasks assessed                      Exercises Total Joint Exercises Hip ABduction/ADduction: AROM;Left;Standing Long Arc Quad: AROM;Left;10 reps;Seated Knee Flexion: AROM;Left;10 reps;Standing Marching in Standing: AROM;Left;10 reps;Standing Standing Hip Extension: AROM;Left;10 reps;Standing    General Comments        Pertinent Vitals/Pain Pain Assessment: 0-10 Pain Score: 4  Pain Location: L hip Pain Descriptors / Indicators: Discomfort;Guarding Pain Intervention(s): Monitored during session;Repositioned;Ice applied    Home Living Family/patient expects to be discharged to:: Private residence Living Arrangements: Spouse/significant other;Children Available Help at Discharge: Family;Available 24 hours/day       Home Layout: One level Home Equipment: Walker - 2 wheels;Shower seat      Prior Function Level of Independence: Independent          PT Goals (current goals can now be found in the care plan section) Acute Rehab PT Goals Patient Stated Goal: To get back to work and walk her dog again Potential  to Achieve Goals: Good Progress towards PT goals: Progressing toward goals    Frequency  7X/week    PT Plan Current plan remains appropriate    Co-evaluation             End of Session Equipment Utilized During Treatment: Gait belt Activity Tolerance: Patient tolerated treatment well Patient left: in bed;with  call bell/phone within reach     Time: 1124-1139 PT Time Calculation (min) (ACUTE ONLY): 15 min  Charges:  $Gait Training: 8-22 mins                    G Codes:      Cristela Blue 05-11-16, 12:39 PM Governor Rooks, PTA pager [phone]

## 2016-04-23 NOTE — Progress Notes (Signed)
Subjective: 2 Days Post-Op Procedure(s) (LRB): LEFT TOTAL HIP ARTHROPLASTY ANTERIOR APPROACH (Left) Patient reports pain as moderate.  Vitals stable.  Making slow progress with therapy.  Complaint of left calf pain.  Objective: Vital signs in last 24 hours: Temp:  [97.6 F (36.4 C)-99 F (37.2 C)] 99 F (37.2 C) (06/29 0555) Pulse Rate:  [85-102] 102 (06/29 0555) Resp:  [16-17] 17 (06/29 0555) BP: (95-136)/(42-69) 128/69 mmHg (06/29 0555) SpO2:  [93 %-99 %] 96 % (06/29 0555)  Intake/Output from previous day: 06/28 0701 - 06/29 0700 In: 360 [P.O.:360] Out: -  Intake/Output this shift:     Recent Labs  04/22/16 0731  HGB 11.1*    Recent Labs  04/22/16 0731  WBC 8.8  RBC 3.69*  HCT 34.2*  PLT 218    Recent Labs  04/22/16 0731  NA 133*  K 3.6  CL 102  CO2 24  BUN 13  CREATININE 0.58  GLUCOSE 126*  CALCIUM 8.3*   No results for input(s): LABPT, INR in the last 72 hours.  Sensation intact distally Intact pulses distally Dorsiflexion/Plantar flexion intact Incision: dressing C/D/I   Left calf tender to palpation, but no specific mass or cord palpated.   Assessment/Plan: 2 Days Post-Op Procedure(s) (LRB): LEFT TOTAL HIP ARTHROPLASTY ANTERIOR APPROACH (Left) Up with therapy  Will order doppler ultrasound to r/o DVT left calf  Terez Montee Y 04/23/2016, 6:58 AM

## 2016-04-23 NOTE — Evaluation (Addendum)
Occupational Therapy Evaluation and Discharge Patient Details Name: Carol Crosby MRN: [id number] DOB: 03-07-1961 Today's Date: 04/23/2016    History of Present Illness pt is a 55 y/o female with h/o L hip OA, HTN, R knee pain, OSA, admitted with L hip pain over the last year, s/p LTHA using direct anterior approach.   Clinical Impression   Pt is a 55 y/o female who was independent prior to admission. Works at [employer], and wants to get back to work. Pt educated on safety during transfers and demonstrated tub transfer, as well as ADL standing at sink with no UE support. Pt required minor verbal cues for safe hand placement. Pt has 89 year old daughter at home as well as additional family support. No OT needs at this time.     Follow Up Recommendations  No OT follow up    Equipment Recommendations  None recommended by OT    Recommendations for Other Services       Precautions / Restrictions Precautions Precautions: Other (comment) Restrictions Weight Bearing Restrictions: Yes LLE Weight Bearing: Weight bearing as tolerated      Mobility Bed Mobility Overal bed mobility: Modified Independent Bed Mobility: Supine to Sit     Supine to sit: Supervision     General bed mobility comments: Pt supervision for bed mobility with minor grimace on face Pt used bed rails to help  Transfers Overall transfer level: Modified independent Equipment used: Rolling walker (2 wheeled) Transfers: Sit to/from Stand Sit to Stand: Supervision Stand pivot transfers: Min guard       General transfer comment: verbal cues for hand placement when standing from toilet    Balance Overall balance assessment: No apparent balance deficits (not formally assessed)   Sitting balance-Leahy Scale: Normal       Standing balance-Leahy Scale: Good                              ADL Overall ADL's : Modified independent                                        General ADL Comments: Pt brushed teeth standing at sink with no UE support, Pt able to transer on and off toilet safely with verbal cues, Pt able to eat independently from chair with set up, Pt slow, but purposeful and able to complete all attemopted ADL     Vision     Perception     Praxis      Pertinent Vitals/Pain Pain Assessment: 0-10 Pain Score: 4  Pain Location: L hip Pain Descriptors / Indicators: Discomfort;Guarding Pain Intervention(s): Monitored during session;Repositioned;Ice applied     Hand Dominance     Extremity/Trunk Assessment     Lower Extremity Assessment LLE Deficits / Details: stiff and sore/weak from pain       Communication Communication Communication: No difficulties   Cognition Arousal/Alertness: Awake/alert Behavior During Therapy: WFL for tasks assessed/performed Overall Cognitive Status: Within Functional Limits for tasks assessed                     General Comments       Exercises       Shoulder Instructions      Home Living Family/patient expects to be discharged to:: Private residence Living Arrangements: Spouse/significant other;Children Available Help at Discharge: Family;Available 24 hours/day  Home Layout: One level     Bathroom Shower/Tub: Tub/shower unit Shower/tub characteristics: Architectural technologist: Standard     Home Equipment: Environmental consultant - 2 wheels;Shower seat          Prior Functioning/Environment Level of Independence: Independent             OT Diagnosis: Generalized weakness;Acute pain   OT Problem List: Decreased strength;Decreased range of motion;Decreased knowledge of use of DME or AE;Decreased safety awareness;Obesity;Pain;Increased edema   OT Treatment/Interventions:      OT Goals(Current goals can be found in the care plan section) Acute Rehab OT Goals Patient Stated Goal: To get back to work and walk her dog again  OT Frequency:     Barriers to D/C:             Co-evaluation              End of Session Equipment Utilized During Treatment: Gait belt;Rolling walker  Activity Tolerance: Patient tolerated treatment well Patient left: in chair;with call bell/phone within reach   Time: 0816-0847 OT Time Calculation (min): 31 min Charges:  OT General Charges $OT Visit: 1 Procedure OT Evaluation $OT Eval Low Complexity: 1 Procedure OT Treatments $Self Care/Home Management : 8-22 mins G-Codes:    Merri Ray Tally Mckinnon, OTR/L 04/23/2016, 1:15 PM   Addendum: Changed precautions to reflect correct status

## 2016-04-23 NOTE — Progress Notes (Signed)
Preliminary results by tech - Left Lower Ext. Venous Duplex Completed. Negative for deep and superficial vein thrombosis.  Asha Grumbine, BS, RDMS, RVT  

## 2016-04-23 NOTE — Care Management Note (Signed)
Case Management Note  Patient Details  Name: Carol Crosby MRN: [id number] Date of Birth: Dec 08, 1960  Subjective/Objective:     55 yr old female s/p left anterior hip arthroplasty.               Action/Plan:  Case manager spoke with patient concerning home health and DME needs. Patient states she has rolling walker and raised toilet. Choice was offered for Home Health agency, patient was preoperatively setup with [facility], no changes. She will have family support at discharge.  Expected Discharge Date:   04/24/16               Expected Discharge Plan:  [place]  In-House Referral:  NA  Discharge planning Services  CM Consult  Post Acute Care Choice:  Home Health Choice offered to:  Patient  DME Arranged:  N/A DME Agency:     HH Arranged:  PT [place] Agency:  [place]  Status of Service:  Completed, signed off  If discussed at [place] of Stay Meetings, dates discussed:    Additional Comments:  Ninfa Meeker, RN 04/23/2016, 3:01 PM

## 2016-04-23 NOTE — Progress Notes (Signed)
Physical Therapy Treatment Patient Details Name: Carol Crosby MRN: [id number] DOB: 1961/07/06 Today's Date: 04/23/2016    History of Present Illness pt is a 55 y/o female with h/o L hip OA, HTN, R knee pain, OSA, admitted with L hip pain over the last year, s/p LTHA using direct anterior approach.    PT Comments    Pt performed increased gait, stair training and complete full review of HEP.  Will f/u in am in prep for d/c.    Follow Up Recommendations  Home health PT     Equipment Recommendations  3in1 (PT)    Recommendations for Other Services       Precautions / Restrictions Precautions Precautions: Other (comment) Restrictions Weight Bearing Restrictions: Yes LLE Weight Bearing: Weight bearing as tolerated    Mobility  Bed Mobility Overal bed mobility: Modified Independent Bed Mobility: Supine to Sit     Supine to sit: Supervision Sit to supine: Min assist   General bed mobility comments: Cues for hand placement, required assist to lift LLE against gravity for back to bed.    Transfers Overall transfer level: Modified independent Equipment used: Rolling walker (2 wheeled) Transfers: Sit to/from Stand Sit to Stand: Modified independent (Device/Increase time) Stand pivot transfers: Modified independent (Device/Increase time)       General transfer comment: verbal cues for hand placement when standing from toilet  Ambulation/Gait Ambulation/Gait assistance: Supervision Ambulation Distance (Feet): 260 Feet Assistive device: Rolling walker (2 wheeled) Gait Pattern/deviations: Step-through pattern;Trunk flexed;Antalgic Gait velocity: slow   General Gait Details: Cues for upright posture and forward gaze.  Pt circumducts L hip due to quad weakness.  Cues for L hip/knee flexion in swing phase.     Stairs Stairs: Yes Stairs assistance: Min assist Stair Management: No rails;Backwards;Forwards Number of Stairs: 2 General stair comments: Cues for sequencing  and RW placement.  Pt ascended stairs backwards and descended stairs forward.    Wheelchair Mobility    Modified Rankin (Stroke Patients Only)       Balance Overall balance assessment: Needs assistance   Sitting balance-Leahy Scale: Normal       Standing balance-Leahy Scale: Good                      Cognition Arousal/Alertness: Awake/alert Behavior During Therapy: WFL for tasks assessed/performed Overall Cognitive Status: Within Functional Limits for tasks assessed                      Exercises Total Joint Exercises Ankle Circles/Pumps: AROM;20 reps;Seated;Supine Quad Sets: AROM;Left;20 reps;Supine Short Arc Quad: AROM;Left;10 reps;Supine Heel Slides: AAROM;Left;10 reps;Supine Hip ABduction/ADduction: AROM;Left;Standing;Supine;20 reps (1x10 supine and 1x10 standing.) Long Arc Quad: AROM;Left;10 reps;Seated Knee Flexion: AROM;Left;10 reps;Standing Marching in Standing: AROM;Left;10 reps;Standing Standing Hip Extension: AROM;Left;10 reps;Standing    General Comments        Pertinent Vitals/Pain Pain Assessment: 0-10 Pain Score: 4  Pain Location: L hip Pain Descriptors / Indicators: Discomfort;Guarding Pain Intervention(s): Monitored during session;Repositioned;Ice applied    Home Living Family/patient expects to be discharged to:: Private residence Living Arrangements: Spouse/significant other;Children Available Help at Discharge: Family;Available 24 hours/day       Home Layout: One level Home Equipment: Walker - 2 wheels;Shower seat      Prior Function Level of Independence: Independent          PT Goals (current goals can now be found in the care plan section) Acute Rehab PT Goals Patient Stated Goal: To get back to  work and walk her dog again Potential to Achieve Goals: Good Progress towards PT goals: Progressing toward goals    Frequency  7X/week    PT Plan Current plan remains appropriate    Co-evaluation              End of Session Equipment Utilized During Treatment: Gait belt Activity Tolerance: Patient tolerated treatment well Patient left: in bed;with call bell/phone within reach     Time: [id number] PT Time Calculation (min) (ACUTE ONLY): 29 min  Charges:  $Gait Training: 8-22 mins $Therapeutic Exercise: 8-22 mins                    G Codes:      Cristela Blue 05/22/2016, 3:50 PM  Governor Rooks, PTA pager [phone]

## 2016-04-24 MED ORDER — METHOCARBAMOL 500 MG PO TABS: 500.0000 mg | ORAL_TABLET | Freq: Four times a day (QID) | ORAL | Status: DC | PRN

## 2016-04-24 MED ORDER — OXYCODONE-ACETAMINOPHEN 5-325 MG PO TABS: 1.0000 | ORAL_TABLET | ORAL | Status: DC | PRN

## 2016-04-24 MED ORDER — ASPIRIN 325 MG PO TBEC: 325.0000 mg | DELAYED_RELEASE_TABLET | Freq: Two times a day (BID) | ORAL | Status: DC

## 2016-04-24 NOTE — Progress Notes (Signed)
Placed pt. on CPAP auto for h/s, attached pts. M/FFM and added 2 lpm Oxygen into circuit, tolerating well, RT to monitor.

## 2016-04-24 NOTE — Progress Notes (Signed)
Physical Therapy Treatment Patient Details Name: Carol SarksDebra S Crosby MRN: [id number] DOB: [birth year] Today's Date: 04/24/2016    History of Present Illness pt is a 55 y/o female with h/o L hip OA, HTN, R knee pain, OSA, admitted with L hip pain over the last year, s/p LTHA using direct anterior approach.    PT Comments    Reviewed HEP with patient.  Pt currently mod I with transfer activities and remains to require supervision due to gait deviations.  Pt ready for d/c this am from mobility standpoint if patient remains hospitalized will f/u this pm.    Follow Up Recommendations  Home health PT     Equipment Recommendations  3in1 (PT)    Recommendations for Other Services       Precautions / Restrictions Precautions Precautions: Other (comment) Restrictions Weight Bearing Restrictions: Yes LLE Weight Bearing: Weight bearing as tolerated    Mobility  Bed Mobility Overal bed mobility: Modified Independent       Supine to sit: Modified independent (Device/Increase time) Sit to supine: Modified independent (Device/Increase time)   General bed mobility comments: Good technique no cues required.    Transfers Overall transfer level: Modified independent Equipment used: Rolling walker (2 wheeled)   Sit to Stand: Modified independent (Device/Increase time)         General transfer comment: Good technique no cues required.    Ambulation/Gait Ambulation/Gait assistance: Supervision Ambulation Distance (Feet): 240 Feet Assistive device: Rolling walker (2 wheeled) Gait Pattern/deviations: Step-through pattern;Antalgic;Wide base of support Gait velocity: slow   General Gait Details: Cues for upright posture and forward gaze.  Pt circumducts L hip due to quad weakness.  Cues for L hip/knee flexion in swing phase.     Stairs Stairs:  (Pt reports she feels confident with stair training.  )          Wheelchair Mobility    Modified Rankin (Stroke Patients Only)        Balance     Sitting balance-Leahy Scale: Normal       Standing balance-Leahy Scale: Good                      Cognition Arousal/Alertness: Lethargic (more sleepy.  ) Behavior During Therapy: WFL for tasks assessed/performed Overall Cognitive Status: Within Functional Limits for tasks assessed                      Exercises Total Joint Exercises Ankle Circles/Pumps: AROM;20 reps;Seated;Supine Quad Sets: AROM;Left;Supine;10 reps Short Arc Quad: AROM;Left;10 reps;Supine Heel Slides: AAROM;Left;10 reps;Supine Hip ABduction/ADduction: AROM;Left;Standing;Supine;20 reps (1x10 supine and 1x10 standing) Long Arc Quad: AROM;Left;10 reps;Seated Knee Flexion: AROM;Left;10 reps;Standing Marching in Standing: AROM;Left;10 reps;Standing Standing Hip Extension: AROM;Left;10 reps;Standing    General Comments        Pertinent Vitals/Pain Pain Assessment: 0-10 Pain Score: 3  Pain Location: L hip Pain Descriptors / Indicators: Grimacing;Guarding Pain Intervention(s): Monitored during session;Repositioned    Home Living                      Prior Function            PT Goals (current goals can now be found in the care plan section) Acute Rehab PT Goals Patient Stated Goal: To get back to work and walk her dog again Potential to Achieve Goals: Good Progress towards PT goals: Progressing toward goals    Frequency  7X/week    PT Plan Current plan remains appropriate  Co-evaluation             End of Session Equipment Utilized During Treatment: Gait belt Activity Tolerance: Patient tolerated treatment well Patient left: in bed;with call bell/phone within reach     Time: 0946-1009 PT Time Calculation (min) (ACUTE ONLY): 23 min  Charges:  $Gait Training: 8-22 mins $Therapeutic Exercise: 8-22 mins                    G Codes:      Carol Crosby 05/22/2016, 1:04 PM  Carol Crosby, PTA pager [phone]

## 2016-04-24 NOTE — Progress Notes (Signed)
Patient ID: Carol Crosby, female   DOB: 14-Jun-1961, 55 y.o.   MRN: [id number] Looks good.  DVT screen negative.  Vitals stable.  Left hip stable.  Can be discharged to home today.

## 2016-04-24 NOTE — Discharge Summary (Signed)
Patient ID: Carol Crosby MRN: [id number] DOB/AGE: 12/15/1960 55 y.o.  Admit date: 04/21/2016 Discharge date: 04/24/2016  Admission Diagnoses:  Principal Problem:   Osteoarthritis of left hip Active Problems:   Status post total replacement of left hip   Discharge Diagnoses:  Same  Past Medical History  Diagnosis Date  . Other chest pain   . Morbid obesity ([place])   . Obstructive sleep apnea (adult) (pediatric)   . Hypersomnia, unspecified   . Excessive or frequent menstruation   . Abdominal pain, right upper quadrant   . Acute sinusitis, unspecified   . Streptococcal sore throat   . Mixed incontinence urge and stress (female)(female)   . Unspecified urinary incontinence   . Esophageal reflux   . Temporomandibular joint disorders, unspecified   . Scarlet fever   . Palpitations   . Hypertension   . History of bronchitis   . Arthritis     Surgeries: Procedure(s): LEFT TOTAL HIP ARTHROPLASTY ANTERIOR APPROACH on 04/21/2016   Consultants:    Discharged Condition: Improved  Hospital Course: Carol Crosby is an 55 y.o. female who was admitted 04/21/2016 for operative treatment ofOsteoarthritis of left hip. Patient has severe unremitting pain that affects sleep, daily activities, and work/hobbies. After pre-op clearance the patient was taken to the operating room on 04/21/2016 and underwent  Procedure(s): LEFT TOTAL HIP ARTHROPLASTY ANTERIOR APPROACH.    Patient was given perioperative antibiotics: Anti-infectives    Start     Dose/Rate Route Frequency Ordered Stop   04/21/16 1900  ceFAZolin (ANCEF) IVPB 2g/100 mL premix     2 g 200 mL/hr over 30 Minutes Intravenous Every 6 hours 04/21/16 1809 04/22/16 0101   04/21/16 1400  ceFAZolin (ANCEF) IVPB 2g/100 mL premix     2 g 200 mL/hr over 30 Minutes Intravenous To [facility]Stay [facility] 04/20/16 1155 04/21/16 1430       Patient was given sequential compression devices, early ambulation, and chemoprophylaxis to prevent  DVT.  Patient benefited maximally from hospital stay and there were no complications.    Recent vital signs: Patient Vitals for the past 24 hrs:  BP Temp Temp src Pulse Resp SpO2  04/24/16 0458 139/78 mmHg 97.5 F (36.4 C) Oral 95 16 96 %  04/23/16 1500 (!) 105/58 mmHg 97.7 F (36.5 C) Oral 98 16 96 %  04/23/16 1058 (!) 100/57 mmHg - - 89 - -     Recent laboratory studies:  Recent Labs  04/22/16 0731  WBC 8.8  HGB 11.1*  HCT 34.2*  PLT 218  NA 133*  K 3.6  CL 102  CO2 24  BUN 13  CREATININE 0.58  GLUCOSE 126*  CALCIUM 8.3*     Discharge Medications:     Medication List    STOP taking these medications        HYDROcodone-acetaminophen 5-325 MG tablet  Commonly known as:  NORCO/VICODIN     traMADol 50 MG tablet  Commonly known as:  ULTRAM      TAKE these medications        acetaminophen 500 MG tablet  Commonly known as:  TYLENOL  Take 500 mg by mouth every 6 (six) hours as needed for mild pain.     aspirin 325 MG EC tablet  Take 1 tablet (325 mg total) by mouth 2 (two) times daily after a meal.     celecoxib 200 MG capsule  Commonly known as:  CELEBREX  Take 1 capsule by mouth daily.     losartan-hydrochlorothiazide  50-12.5 MG tablet  Commonly known as:  HYZAAR  Take 1 tablet by mouth daily.     methocarbamol 500 MG tablet  Commonly known as:  ROBAXIN  Take 1 tablet (500 mg total) by mouth every 6 (six) hours as needed for muscle spasms.     oxyCODONE-acetaminophen 5-325 MG tablet  Commonly known as:  ROXICET  Take 1-2 tablets by mouth every 4 (four) hours as needed.     traZODone 50 MG tablet  Commonly known as:  DESYREL  TAKE ONE-HALF TO ONE TABLET BY MOUTH AT BEDTIME AS NEEDED FOR SLEEP        Diagnostic Studies: Mr Hip Left Wo Contrast  04/09/2016  CLINICAL DATA:  Chronic left hip pain.  No known injury. EXAM: MR OF THE LEFT HIP WITHOUT CONTRAST TECHNIQUE: Multiplanar, multisequence MR imaging was performed. No intravenous contrast was  administered. COMPARISON:  Plain films left hip 07/06/2015. FINDINGS: Bones: No fracture or stress change is identified about the hips. There is no avascular necrosis of the femoral heads. No worrisome marrow lesion is identified. Articular cartilage and labrum Articular cartilage:  Appears mildly thinned bilaterally. Labrum:  No tear identified. Joint or bursal effusion Joint effusion:  Small joint effusions are seen bilaterally. Bursae:  Unremarkable. Muscles and tendons Muscles and tendons:  Intact. Other findings Miscellaneous:  Imaged intrapelvic contents are unremarkable. IMPRESSION: No acute or focal abnormality. Mild appearing degenerative change is seen about both hips. Electronically Signed   By: Inge Rise M.D.   On: 04/09/2016 07:26   Dg Hip Port Unilat With Pelvis 1v Left  04/21/2016  CLINICAL DATA:  Status post left total hip replacement. EXAM: DG HIP (WITH OR WITHOUT PELVIS) 1V PORT LEFT COMPARISON:  Intraoperative films of earlier the same day. FINDINGS: Frontal pelvis with AP frontal portable view of the left hip shows the patient be status post left total hip arthroplasty. No evidence for immediate hardware complications. Gas in the overlying soft tissues is compatible with the recent surgery. IMPRESSION: Status post left total hip replacement without complicating features. Electronically Signed   By: Misty Stanley M.D.   On: 04/21/2016 17:10   Dg Hip Operative Unilat With Pelvis Left  04/21/2016  CLINICAL DATA:  Left total hip arthroplasty EXAM: OPERATIVE left HIP ( 2 VIEWS TECHNIQUE: Fluoroscopic spot image(s) were submitted for interpretation post-operatively. COMPARISON:  None. FINDINGS: Two views of the left hip submitted. There is left hip prosthesis with anatomic alignment IMPRESSION: There is left hip prosthesis with anatomic alignment. Electronically Signed   By: Lahoma Crocker M.D.   On: 04/21/2016 16:15    Disposition: 01-Home or Self Care      Discharge Instructions     Call MD / Call 911    Complete by:  As directed   If you experience chest pain or shortness of breath, CALL 911 and be transported to the hospital emergency room.  If you develope a fever above 101 F, pus (white drainage) or increased drainage or redness at the wound, or calf pain, call your surgeon's office.     Constipation Prevention    Complete by:  As directed   Drink plenty of fluids.  Prune juice may be helpful.  You may use a stool softener, such as Colace (over the counter) 100 mg twice a day.  Use MiraLax (over the counter) for constipation as needed.     Diet - low sodium heart healthy    Complete by:  As directed  Discharge patient    Complete by:  As directed      Increase activity slowly as tolerated    Complete by:  As directed            Follow-up Information    Follow up with [facility].   Why:  Someone from [facility]), will contact you to arrange start date and time for therapy.   Contact information:   [address] [place] [address] [phone]       Follow up with Mcarthur Rossetti, MD In 2 weeks.   Specialty:  Orthopedic Surgery   Contact information:   [place] [address] [phone]        Signed: Mcarthur Rossetti 04/24/2016, 6:50 AM

## 2016-04-24 NOTE — Progress Notes (Signed)
Alerted mary w [facility] of dc for today. Per prev cm has eq.

## 2016-04-24 NOTE — Discharge Instructions (Signed)
You can get your dressing wet in the shower daily. Change your dressing Monday July 3, get your incision wet, then dry it off well and place a new similar dressing and keep that one on until your outpatient follow-up.

## 2016-06-04 ENCOUNTER — Other Ambulatory Visit: Payer: Self-pay | Admitting: Family Medicine

## 2016-06-04 DIAGNOSIS — Z1231 Encounter for screening mammogram for malignant neoplasm of breast: Secondary | ICD-10-CM

## 2016-06-08 ENCOUNTER — Ambulatory Visit
Admission: RE | Admit: 2016-06-08 | Discharge: 2016-06-08 | Disposition: A | Discharge status: Home / Self Care | Payer: BC Managed Care – PPO | Source: Ambulatory Visit | Attending: Family Medicine | Admitting: Family Medicine

## 2016-06-08 DIAGNOSIS — Z1231 Encounter for screening mammogram for malignant neoplasm of breast: Secondary | ICD-10-CM

## 2016-06-16 ENCOUNTER — Other Ambulatory Visit: Payer: Self-pay | Admitting: Family Medicine

## 2016-06-17 NOTE — Telephone Encounter (Signed)
Last office visit 01/24/2016.  Last refilled 03/30/2016 for #30 with no refills. Ok to refill?

## 2016-06-18 MED ORDER — TRAZODONE HCL 50 MG PO TABS: 50.0000 mg | ORAL_TABLET | Freq: Every day | ORAL | 0 refills | Status: DC

## 2016-07-30 ENCOUNTER — Ambulatory Visit (INDEPENDENT_AMBULATORY_CARE_PROVIDER_SITE_OTHER): Payer: BC Managed Care – PPO | Admitting: Orthopaedic Surgery

## 2016-07-30 DIAGNOSIS — M160 Bilateral primary osteoarthritis of hip: Secondary | ICD-10-CM | POA: Diagnosis not present

## 2016-08-11 ENCOUNTER — Other Ambulatory Visit: Payer: Self-pay | Admitting: Family Medicine

## 2016-08-12 NOTE — Telephone Encounter (Signed)
Last refill 06/18/16 #30, last OV 01/24/16. Ok to refill?

## 2016-08-13 ENCOUNTER — Ambulatory Visit (INDEPENDENT_AMBULATORY_CARE_PROVIDER_SITE_OTHER): Payer: BC Managed Care – PPO | Admitting: Orthopaedic Surgery

## 2016-08-13 DIAGNOSIS — M1611 Unilateral primary osteoarthritis, right hip: Secondary | ICD-10-CM | POA: Diagnosis not present

## 2016-08-13 DIAGNOSIS — M25551 Pain in right hip: Secondary | ICD-10-CM | POA: Diagnosis not present

## 2016-08-17 ENCOUNTER — Encounter (INDEPENDENT_AMBULATORY_CARE_PROVIDER_SITE_OTHER): Payer: Self-pay

## 2016-08-24 ENCOUNTER — Other Ambulatory Visit (INDEPENDENT_AMBULATORY_CARE_PROVIDER_SITE_OTHER): Payer: Self-pay | Admitting: Orthopaedic Surgery

## 2016-08-24 DIAGNOSIS — M1611 Unilateral primary osteoarthritis, right hip: Secondary | ICD-10-CM

## 2016-08-26 ENCOUNTER — Encounter (HOSPITAL_COMMUNITY): Payer: Self-pay

## 2016-08-27 ENCOUNTER — Encounter (HOSPITAL_COMMUNITY)
Admission: RE | Admit: 2016-08-27 | Discharge: 2016-08-27 | Disposition: A | Discharge status: Home / Self Care | Payer: BC Managed Care – PPO | Source: Ambulatory Visit | Attending: Orthopaedic Surgery | Admitting: Orthopaedic Surgery

## 2016-08-27 ENCOUNTER — Encounter (HOSPITAL_COMMUNITY): Payer: Self-pay

## 2016-08-27 DIAGNOSIS — Z01818 Encounter for other preprocedural examination: Secondary | ICD-10-CM | POA: Insufficient documentation

## 2016-08-27 DIAGNOSIS — M1611 Unilateral primary osteoarthritis, right hip: Secondary | ICD-10-CM | POA: Diagnosis not present

## 2016-08-27 LAB — BASIC METABOLIC PANEL
ANION GAP: 9 (ref 5–15)
BUN: 13 mg/dL (ref 6–20)
CALCIUM: 9.3 mg/dL (ref 8.9–10.3)
CO2: 25 mmol/L (ref 22–32)
Chloride: 106 mmol/L (ref 101–111)
Creatinine, Ser: 0.65 mg/dL (ref 0.44–1.00)
GFR calc Af Amer: >60 mL/min (ref >60)
GLUCOSE: 95 mg/dL (ref 65–99)
Potassium: 3.8 mmol/L (ref 3.5–5.1)
SODIUM: 140 mmol/L (ref 135–145)

## 2016-08-27 LAB — CBC
HCT: 43.3 % (ref 36.0–46.0)
HEMOGLOBIN: 13.5 g/dL (ref 12.0–15.0)
MCH: 29.3 pg (ref 26.0–34.0)
MCHC: 31.2 g/dL (ref 30.0–36.0)
MCV: 93.9 fL (ref 78.0–100.0)
Platelets: 291 10*3/uL (ref 150–400)
RBC: 4.61 MIL/uL (ref 3.87–5.11)
RDW: 14.7 % (ref 11.5–15.5)
WBC: 6.7 10*3/uL (ref 4.0–10.5)

## 2016-08-27 LAB — SURGICAL PCR SCREEN
MRSA, PCR: NEGATIVE
STAPHYLOCOCCUS AUREUS: NEGATIVE

## 2016-08-27 NOTE — Pre-Procedure Instructions (Signed)
TAMELLA ADDAMS  08/27/2016      [place], [place] [id number] [place] [place] [address] Phone: [phone] Fax: [phone]    Your procedure is scheduled on 09/01/2016  Report to [facility] Admitting at 12:15P.M.  Call this number if you have problems the morning of surgery:  [phone]   Remember:  Do not eat food or drink liquids after midnight.  ON Monday NIGHT  Take these medicines the morning of surgery with A SIP OF WATER : may take pain medicine if needed morning of surgery    Do not wear jewelry, make-up or nail polish.   Do not wear lotions, powders, or perfumes, or deoderant.   Do not shave 48 hours prior to surgery.     Do not bring valuables to the hospital.   [facility] is not responsible for any belongings or valuables.  Contacts, dentures or bridgework may not be worn into surgery.  Leave your suitcase in the car.  After surgery it may be brought to your room.  For patients admitted to the hospital, discharge time will be determined by your treatment team.  Patients discharged the day of surgery will not be allowed to drive home.   Name and phone number of your driver:   With daughter   Special instructions:  Special Instructions: [place] - Preparing for Surgery  Before surgery, you can play an important role.  Because skin is not sterile, your skin needs to be as free of germs as possible.  You can reduce the number of germs on you skin by washing with CHG (chlorahexidine gluconate) soap before surgery.  CHG is an antiseptic cleaner which kills germs and bonds with the skin to continue killing germs even after washing.  Please DO NOT use if you have an allergy to CHG or antibacterial soaps.  If your skin becomes reddened/irritated stop using the CHG and inform your nurse when you arrive at Short Stay.  Do not shave (including legs and underarms) for at least 48 hours prior to  the first CHG shower.  You may shave your face.  Please follow these instructions carefully:   1.  Shower with CHG Soap the night before surgery and the  morning of Surgery.  2.  If you choose to wash your hair, wash your hair first as usual with your  normal shampoo.  3.  After you shampoo, rinse your hair and body thoroughly to remove the  Shampoo.  4.  Use CHG as you would any other liquid soap.  You can apply chg directly to the skin and wash gently with scrungie or a clean washcloth.  5.  Apply the CHG Soap to your body ONLY FROM THE NECK DOWN.    Do not use on open wounds or open sores.  Avoid contact with your eyes, ears, mouth and genitals (private parts).  Wash genitals (private parts)   with your normal soap.  6.  Wash thoroughly, paying special attention to the area where your surgery will be performed.  7.  Thoroughly rinse your body with warm water from the neck down.  8.  DO NOT shower/wash with your normal soap after using and rinsing off   the CHG Soap.  9.  Pat yourself dry with a clean towel.            10.  Wear clean pajamas.  11.  Place clean sheets on your bed the night of your first shower and do not sleep with pets.  Day of Surgery  Do not apply any lotions/deodorants the morning of surgery.  Please wear clean clothes to the hospital/surgery center.  Please read over the following fact sheets that you were given. Pain Booklet, Coughing and Deep Breathing, MRSA Information and Surgical Site Infection Prevention

## 2016-08-27 NOTE — Progress Notes (Signed)
Pt. Uses CPAP q night, last eval.- several yrs. Ago, unsure of current pressure setting. Pt. Not followed by cardiac, is followed by Dr. Diona Browner for PCP.

## 2016-08-31 ENCOUNTER — Ambulatory Visit (INDEPENDENT_AMBULATORY_CARE_PROVIDER_SITE_OTHER): Payer: BC Managed Care – PPO | Admitting: Orthopaedic Surgery

## 2016-08-31 MED ORDER — CEFAZOLIN SODIUM-DEXTROSE 2-4 GM/100ML-% IV SOLN
2.0000 g | INTRAVENOUS | Status: AC
  Administered 2016-09-01: 2 g via INTRAVENOUS
  Filled 2016-08-31: qty 100

## 2016-08-31 MED ORDER — TRANEXAMIC ACID 1000 MG/10ML IV SOLN
1000.0000 mg | INTRAVENOUS | Status: AC
  Administered 2016-09-01: 1000 mg via INTRAVENOUS
  Filled 2016-08-31: qty 10

## 2016-09-01 ENCOUNTER — Inpatient Hospital Stay (HOSPITAL_COMMUNITY): Payer: BC Managed Care – PPO | Admitting: Anesthesiology

## 2016-09-01 ENCOUNTER — Inpatient Hospital Stay (HOSPITAL_COMMUNITY): Payer: BC Managed Care – PPO

## 2016-09-01 ENCOUNTER — Encounter (HOSPITAL_COMMUNITY): Payer: Self-pay | Admitting: *Deleted

## 2016-09-01 ENCOUNTER — Inpatient Hospital Stay (HOSPITAL_COMMUNITY)
Admission: RE | Admit: 2016-09-01 | Discharge: 2016-09-04 | DRG: 470 | Disposition: A | Discharge status: Home Health | Payer: BC Managed Care – PPO | Source: Ambulatory Visit | Attending: Orthopaedic Surgery | Admitting: Orthopaedic Surgery

## 2016-09-01 ENCOUNTER — Encounter (HOSPITAL_COMMUNITY)
Admission: RE | Disposition: A | Discharge status: Home Health | Payer: Self-pay | Source: Ambulatory Visit | Attending: Orthopaedic Surgery

## 2016-09-01 DIAGNOSIS — E662 Morbid (severe) obesity with alveolar hypoventilation: Secondary | ICD-10-CM | POA: Diagnosis present

## 2016-09-01 DIAGNOSIS — Z7982 Long term (current) use of aspirin: Secondary | ICD-10-CM | POA: Diagnosis not present

## 2016-09-01 DIAGNOSIS — Z833 Family history of diabetes mellitus: Secondary | ICD-10-CM | POA: Diagnosis not present

## 2016-09-01 DIAGNOSIS — Z8249 Family history of ischemic heart disease and other diseases of the circulatory system: Secondary | ICD-10-CM

## 2016-09-01 DIAGNOSIS — Z96642 Presence of left artificial hip joint: Secondary | ICD-10-CM | POA: Diagnosis present

## 2016-09-01 DIAGNOSIS — Z800 Family history of malignant neoplasm of digestive organs: Secondary | ICD-10-CM | POA: Diagnosis not present

## 2016-09-01 DIAGNOSIS — Z87891 Personal history of nicotine dependence: Secondary | ICD-10-CM

## 2016-09-01 DIAGNOSIS — M67431 Ganglion, right wrist: Secondary | ICD-10-CM | POA: Diagnosis present

## 2016-09-01 DIAGNOSIS — M25551 Pain in right hip: Secondary | ICD-10-CM | POA: Diagnosis present

## 2016-09-01 DIAGNOSIS — R112 Nausea with vomiting, unspecified: Secondary | ICD-10-CM | POA: Diagnosis not present

## 2016-09-01 DIAGNOSIS — I10 Essential (primary) hypertension: Secondary | ICD-10-CM | POA: Diagnosis present

## 2016-09-01 DIAGNOSIS — M1611 Unilateral primary osteoarthritis, right hip: Principal | ICD-10-CM | POA: Diagnosis present

## 2016-09-01 DIAGNOSIS — M1990 Unspecified osteoarthritis, unspecified site: Secondary | ICD-10-CM

## 2016-09-01 DIAGNOSIS — Z96641 Presence of right artificial hip joint: Secondary | ICD-10-CM

## 2016-09-01 HISTORY — PX: TOTAL HIP ARTHROPLASTY: SHX124

## 2016-09-01 SURGERY — ARTHROPLASTY, HIP, TOTAL, ANTERIOR APPROACH
Anesthesia: General | Site: Hip | Laterality: Right

## 2016-09-01 MED ORDER — HYDROMORPHONE HCL 2 MG/ML IJ SOLN
1.0000 mg | INTRAMUSCULAR | Status: DC | PRN
  Administered 2016-09-02 – 2016-09-03 (×2): 1 mg via INTRAVENOUS
  Filled 2016-09-01 (×2): qty 1

## 2016-09-01 MED ORDER — PROPOFOL 10 MG/ML IV BOLUS
INTRAVENOUS | Status: AC
  Filled 2016-09-01: qty 20

## 2016-09-01 MED ORDER — ASPIRIN EC 325 MG PO TBEC
325.0000 mg | DELAYED_RELEASE_TABLET | Freq: Two times a day (BID) | ORAL | Status: DC
  Administered 2016-09-02 – 2016-09-04 (×5): 325 mg via ORAL
  Filled 2016-09-01 (×5): qty 1

## 2016-09-01 MED ORDER — SODIUM CHLORIDE 0.9 % IV SOLN
INTRAVENOUS | Status: DC
  Administered 2016-09-02 – 2016-09-03 (×2): via INTRAVENOUS

## 2016-09-01 MED ORDER — ACETAMINOPHEN 650 MG RE SUPP: 650.0000 mg | Freq: Four times a day (QID) | RECTAL | Status: DC | PRN

## 2016-09-01 MED ORDER — ACETAMINOPHEN 325 MG PO TABS: 650.0000 mg | ORAL_TABLET | Freq: Four times a day (QID) | ORAL | Status: DC | PRN

## 2016-09-01 MED ORDER — FENTANYL CITRATE (PF) 100 MCG/2ML IJ SOLN
INTRAMUSCULAR | Status: DC | PRN
  Administered 2016-09-01: 150 ug via INTRAVENOUS
  Administered 2016-09-01: 25 ug via INTRAVENOUS
  Administered 2016-09-01: 50 ug via INTRAVENOUS
  Administered 2016-09-01: 25 ug via INTRAVENOUS
  Administered 2016-09-01: 50 ug via INTRAVENOUS

## 2016-09-01 MED ORDER — LOSARTAN POTASSIUM-HCTZ 50-12.5 MG PO TABS: 1.0000 | ORAL_TABLET | Freq: Every day | ORAL | Status: DC

## 2016-09-01 MED ORDER — PROMETHAZINE HCL 25 MG/ML IJ SOLN: 6.2500 mg | INTRAMUSCULAR | Status: DC | PRN

## 2016-09-01 MED ORDER — GLYCOPYRROLATE 0.2 MG/ML IJ SOLN
INTRAMUSCULAR | Status: DC | PRN
  Administered 2016-09-01: 0.1 mg via INTRAVENOUS

## 2016-09-01 MED ORDER — METOCLOPRAMIDE HCL 5 MG/ML IJ SOLN
5.0000 mg | Freq: Three times a day (TID) | INTRAMUSCULAR | Status: DC | PRN
  Administered 2016-09-03 – 2016-09-04 (×2): 10 mg via INTRAVENOUS
  Filled 2016-09-01 (×2): qty 2

## 2016-09-01 MED ORDER — LOSARTAN POTASSIUM 50 MG PO TABS
50.0000 mg | ORAL_TABLET | Freq: Every day | ORAL | Status: DC
  Administered 2016-09-02 – 2016-09-04 (×3): 50 mg via ORAL
  Filled 2016-09-01 (×3): qty 1

## 2016-09-01 MED ORDER — ONDANSETRON HCL 4 MG/2ML IJ SOLN
4.0000 mg | Freq: Four times a day (QID) | INTRAMUSCULAR | Status: DC | PRN
  Administered 2016-09-03 – 2016-09-04 (×3): 4 mg via INTRAVENOUS
  Filled 2016-09-01 (×4): qty 2

## 2016-09-01 MED ORDER — LIDOCAINE HCL 1 % IJ SOLN
INTRAMUSCULAR | Status: DC | PRN
  Administered 2016-09-01: 1 mL via INTRADERMAL

## 2016-09-01 MED ORDER — METHYLPREDNISOLONE ACETATE 40 MG/ML IJ SUSP
INTRAMUSCULAR | Status: AC
  Filled 2016-09-01: qty 1

## 2016-09-01 MED ORDER — DEXAMETHASONE SODIUM PHOSPHATE 10 MG/ML IJ SOLN
INTRAMUSCULAR | Status: DC | PRN
  Administered 2016-09-01: 10 mg via INTRAVENOUS

## 2016-09-01 MED ORDER — 0.9 % SODIUM CHLORIDE (POUR BTL) OPTIME
TOPICAL | Status: DC | PRN
  Administered 2016-09-01: 1000 mL

## 2016-09-01 MED ORDER — CEFAZOLIN SODIUM-DEXTROSE 2-4 GM/100ML-% IV SOLN
2.0000 g | Freq: Four times a day (QID) | INTRAVENOUS | Status: AC
  Administered 2016-09-01 – 2016-09-02 (×2): 2 g via INTRAVENOUS
  Filled 2016-09-01 (×3): qty 100

## 2016-09-01 MED ORDER — LIDOCAINE HCL (PF) 1 % IJ SOLN
INTRAMUSCULAR | Status: AC
  Filled 2016-09-01: qty 5

## 2016-09-01 MED ORDER — MIDAZOLAM HCL 2 MG/2ML IJ SOLN
INTRAMUSCULAR | Status: AC
  Filled 2016-09-01: qty 2

## 2016-09-01 MED ORDER — DIPHENHYDRAMINE HCL 12.5 MG/5ML PO ELIX: 12.5000 mg | ORAL_SOLUTION | ORAL | Status: DC | PRN

## 2016-09-01 MED ORDER — ONDANSETRON HCL 4 MG PO TABS
4.0000 mg | ORAL_TABLET | Freq: Four times a day (QID) | ORAL | Status: DC | PRN
  Administered 2016-09-03: 4 mg via ORAL
  Filled 2016-09-01: qty 1

## 2016-09-01 MED ORDER — DOCUSATE SODIUM 100 MG PO CAPS
100.0000 mg | ORAL_CAPSULE | Freq: Two times a day (BID) | ORAL | Status: DC
  Administered 2016-09-01 – 2016-09-04 (×5): 100 mg via ORAL
  Filled 2016-09-01 (×5): qty 1

## 2016-09-01 MED ORDER — FENTANYL CITRATE (PF) 100 MCG/2ML IJ SOLN
INTRAMUSCULAR | Status: AC
  Filled 2016-09-01: qty 2

## 2016-09-01 MED ORDER — ROCURONIUM BROMIDE 100 MG/10ML IV SOLN
INTRAVENOUS | Status: DC | PRN
  Administered 2016-09-01: 50 mg via INTRAVENOUS

## 2016-09-01 MED ORDER — PHENYLEPHRINE HCL 10 MG/ML IJ SOLN
INTRAMUSCULAR | Status: AC
  Filled 2016-09-01: qty 1

## 2016-09-01 MED ORDER — MENTHOL 3 MG MT LOZG: 1.0000 | LOZENGE | OROMUCOSAL | Status: DC | PRN

## 2016-09-01 MED ORDER — METHOCARBAMOL 500 MG PO TABS
500.0000 mg | ORAL_TABLET | Freq: Four times a day (QID) | ORAL | Status: DC | PRN
  Administered 2016-09-01 – 2016-09-02 (×3): 500 mg via ORAL
  Filled 2016-09-01 (×4): qty 1

## 2016-09-01 MED ORDER — SUGAMMADEX SODIUM 200 MG/2ML IV SOLN
INTRAVENOUS | Status: DC | PRN
  Administered 2016-09-01: 300 mg via INTRAVENOUS

## 2016-09-01 MED ORDER — LACTATED RINGERS IV SOLN
INTRAVENOUS | Status: DC
  Administered 2016-09-01 (×3): via INTRAVENOUS

## 2016-09-01 MED ORDER — PHENYLEPHRINE HCL 10 MG/ML IJ SOLN
INTRAMUSCULAR | Status: DC | PRN
  Administered 2016-09-01 (×2): 80 ug via INTRAVENOUS

## 2016-09-01 MED ORDER — METHYLPREDNISOLONE ACETATE 40 MG/ML IJ SUSP
INTRAMUSCULAR | Status: DC | PRN
  Administered 2016-09-01: 40 mg via INTRALESIONAL

## 2016-09-01 MED ORDER — ONDANSETRON HCL 4 MG/2ML IJ SOLN
INTRAMUSCULAR | Status: AC
  Filled 2016-09-01: qty 2

## 2016-09-01 MED ORDER — SUCCINYLCHOLINE CHLORIDE 20 MG/ML IJ SOLN
INTRAMUSCULAR | Status: DC | PRN
  Administered 2016-09-01: 120 mg via INTRAVENOUS

## 2016-09-01 MED ORDER — METOCLOPRAMIDE HCL 5 MG PO TABS: 5.0000 mg | ORAL_TABLET | Freq: Three times a day (TID) | ORAL | Status: DC | PRN

## 2016-09-01 MED ORDER — ONDANSETRON HCL 4 MG/2ML IJ SOLN
INTRAMUSCULAR | Status: DC | PRN
  Administered 2016-09-01: 4 mg via INTRAVENOUS

## 2016-09-01 MED ORDER — MIDAZOLAM HCL 5 MG/5ML IJ SOLN
INTRAMUSCULAR | Status: DC | PRN
  Administered 2016-09-01: 2 mg via INTRAVENOUS

## 2016-09-01 MED ORDER — ALUM & MAG HYDROXIDE-SIMETH 200-200-20 MG/5ML PO SUSP: 30.0000 mL | ORAL | Status: DC | PRN

## 2016-09-01 MED ORDER — FENTANYL CITRATE (PF) 100 MCG/2ML IJ SOLN
INTRAMUSCULAR | Status: AC
  Filled 2016-09-01: qty 4

## 2016-09-01 MED ORDER — KETOROLAC TROMETHAMINE 15 MG/ML IJ SOLN
7.5000 mg | Freq: Four times a day (QID) | INTRAMUSCULAR | Status: AC
  Administered 2016-09-01 – 2016-09-02 (×4): 7.5 mg via INTRAVENOUS
  Filled 2016-09-01 (×3): qty 1

## 2016-09-01 MED ORDER — HYDROCHLOROTHIAZIDE 12.5 MG PO CAPS
12.5000 mg | ORAL_CAPSULE | Freq: Every day | ORAL | Status: DC
  Administered 2016-09-02 – 2016-09-04 (×3): 12.5 mg via ORAL
  Filled 2016-09-01 (×3): qty 1

## 2016-09-01 MED ORDER — KETOROLAC TROMETHAMINE 15 MG/ML IJ SOLN
INTRAMUSCULAR | Status: AC
  Filled 2016-09-01: qty 1

## 2016-09-01 MED ORDER — LIDOCAINE HCL (CARDIAC) 20 MG/ML IV SOLN
INTRAVENOUS | Status: DC | PRN
  Administered 2016-09-01: 100 mg via INTRAVENOUS

## 2016-09-01 MED ORDER — OXYCODONE HCL 5 MG PO TABS
5.0000 mg | ORAL_TABLET | ORAL | Status: DC | PRN
  Administered 2016-09-01 – 2016-09-02 (×6): 10 mg via ORAL
  Administered 2016-09-03: 5 mg via ORAL
  Filled 2016-09-01 (×6): qty 2
  Filled 2016-09-01: qty 1

## 2016-09-01 MED ORDER — PHENOL 1.4 % MT LIQD: 1.0000 | OROMUCOSAL | Status: DC | PRN

## 2016-09-01 MED ORDER — FENTANYL CITRATE (PF) 100 MCG/2ML IJ SOLN
INTRAMUSCULAR | Status: AC
  Administered 2016-09-01: 50 ug via INTRAVENOUS
  Filled 2016-09-01: qty 2

## 2016-09-01 MED ORDER — ZOLPIDEM TARTRATE 5 MG PO TABS: 5.0000 mg | ORAL_TABLET | Freq: Every evening | ORAL | Status: DC | PRN

## 2016-09-01 MED ORDER — PROPOFOL 10 MG/ML IV BOLUS
INTRAVENOUS | Status: DC | PRN
  Administered 2016-09-01: 200 mg via INTRAVENOUS

## 2016-09-01 MED ORDER — METHOCARBAMOL 1000 MG/10ML IJ SOLN
500.0000 mg | Freq: Four times a day (QID) | INTRAVENOUS | Status: DC | PRN
  Filled 2016-09-01: qty 5

## 2016-09-01 MED ORDER — PHENYLEPHRINE 40 MCG/ML (10ML) SYRINGE FOR IV PUSH (FOR BLOOD PRESSURE SUPPORT)
PREFILLED_SYRINGE | INTRAVENOUS | Status: AC
  Filled 2016-09-01: qty 10

## 2016-09-01 MED ORDER — SODIUM CHLORIDE 0.9 % IR SOLN
Status: DC | PRN
Start: 2016-09-01 — End: 2016-09-01
  Administered 2016-09-01: 3000 mL

## 2016-09-01 MED ORDER — OXYCODONE HCL 5 MG PO TABS
ORAL_TABLET | ORAL | Status: AC
  Administered 2016-09-01: 10 mg via ORAL
  Filled 2016-09-01: qty 2

## 2016-09-01 MED ORDER — FENTANYL CITRATE (PF) 100 MCG/2ML IJ SOLN
25.0000 ug | INTRAMUSCULAR | Status: DC | PRN
  Administered 2016-09-01 (×3): 50 ug via INTRAVENOUS

## 2016-09-01 SURGICAL SUPPLY — 60 items
BENZOIN TINCTURE PRP APPL 2/3 (GAUZE/BANDAGES/DRESSINGS) IMPLANT
BLADE SAW SGTL 18X1.27X75 (BLADE) ×2 IMPLANT
BLADE SAW SGTL 18X1.27X75MM (BLADE) ×1
BLADE SURG ROTATE 9660 (MISCELLANEOUS) IMPLANT
CAPT HIP TOTAL 2 ×3 IMPLANT
CELLS DAT CNTRL 66122 CELL SVR (MISCELLANEOUS) ×1 IMPLANT
CLOSURE WOUND 1/2 X4 (GAUZE/BANDAGES/DRESSINGS) ×2
COVER SURGICAL LIGHT HANDLE (MISCELLANEOUS) ×3 IMPLANT
DRAPE C-ARM 42X72 X-RAY (DRAPES) ×3 IMPLANT
DRAPE STERI IOBAN 125X83 (DRAPES) ×6 IMPLANT
DRAPE U-SHAPE 47X51 STRL (DRAPES) ×9 IMPLANT
DRSG AQUACEL AG ADV 3.5X10 (GAUZE/BANDAGES/DRESSINGS) ×3 IMPLANT
DURAPREP 26ML APPLICATOR (WOUND CARE) ×3 IMPLANT
ELECT BLADE 4.0 EZ CLEAN MEGAD (MISCELLANEOUS) ×3
ELECT BLADE 6.5 EXT (BLADE) IMPLANT
ELECT REM PT RETURN 9FT ADLT (ELECTROSURGICAL) ×3
ELECTRODE BLDE 4.0 EZ CLN MEGD (MISCELLANEOUS) ×1 IMPLANT
ELECTRODE REM PT RTRN 9FT ADLT (ELECTROSURGICAL) ×1 IMPLANT
FACESHIELD WRAPAROUND (MASK) ×9 IMPLANT
GAUZE XEROFORM 1X8 LF (GAUZE/BANDAGES/DRESSINGS) ×3 IMPLANT
GLOVE BIOGEL PI IND STRL 6.5 (GLOVE) ×1 IMPLANT
GLOVE BIOGEL PI IND STRL 8 (GLOVE) ×2 IMPLANT
GLOVE BIOGEL PI INDICATOR 6.5 (GLOVE) ×2
GLOVE BIOGEL PI INDICATOR 8 (GLOVE) ×4
GLOVE ECLIPSE 7.0 STRL STRAW (GLOVE) ×3 IMPLANT
GLOVE ECLIPSE 8.0 STRL XLNG CF (GLOVE) ×3 IMPLANT
GLOVE INDICATOR 7.0 STRL GRN (GLOVE) ×3 IMPLANT
GLOVE INDICATOR 7.5 STRL GRN (GLOVE) ×3 IMPLANT
GLOVE ORTHO TXT STRL SZ7.5 (GLOVE) ×6 IMPLANT
GLOVE SURG SS PI 6.0 STRL IVOR (GLOVE) ×3 IMPLANT
GLOVE SURG SS PI 6.5 STRL IVOR (GLOVE) ×3 IMPLANT
GOWN STRL REUS W/ TWL LRG LVL3 (GOWN DISPOSABLE) ×2 IMPLANT
GOWN STRL REUS W/ TWL XL LVL3 (GOWN DISPOSABLE) ×2 IMPLANT
GOWN STRL REUS W/TWL LRG LVL3 (GOWN DISPOSABLE) ×6
GOWN STRL REUS W/TWL XL LVL3 (GOWN DISPOSABLE) ×6
HANDPIECE INTERPULSE COAX TIP (DISPOSABLE) ×3
KIT BASIN OR (CUSTOM PROCEDURE TRAY) ×3 IMPLANT
KIT ROOM TURNOVER OR (KITS) ×3 IMPLANT
MANIFOLD NEPTUNE II (INSTRUMENTS) ×3 IMPLANT
NEEDLE 18GX1X1/2 (RX/OR ONLY) (NEEDLE) ×3 IMPLANT
NS IRRIG 1000ML POUR BTL (IV SOLUTION) ×3 IMPLANT
PACK TOTAL JOINT (CUSTOM PROCEDURE TRAY) ×3 IMPLANT
PAD ARMBOARD 7.5X6 YLW CONV (MISCELLANEOUS) ×3 IMPLANT
RTRCTR WOUND ALEXIS 18CM MED (MISCELLANEOUS) ×3
SET HNDPC FAN SPRY TIP SCT (DISPOSABLE) ×1 IMPLANT
STAPLER VISISTAT 35W (STAPLE) ×6 IMPLANT
STRIP CLOSURE SKIN 1/2X4 (GAUZE/BANDAGES/DRESSINGS) ×4 IMPLANT
SUT ETHIBOND NAB CT1 #1 30IN (SUTURE) ×3 IMPLANT
SUT MNCRL AB 4-0 PS2 18 (SUTURE) ×3 IMPLANT
SUT VIC AB 0 CT1 27 (SUTURE) ×3
SUT VIC AB 0 CT1 27XBRD ANBCTR (SUTURE) ×1 IMPLANT
SUT VIC AB 1 CT1 27 (SUTURE) ×3
SUT VIC AB 1 CT1 27XBRD ANBCTR (SUTURE) ×1 IMPLANT
SUT VIC AB 2-0 CT1 27 (SUTURE) ×6
SUT VIC AB 2-0 CT1 TAPERPNT 27 (SUTURE) ×2 IMPLANT
TOWEL OR 17X24 6PK STRL BLUE (TOWEL DISPOSABLE) ×3 IMPLANT
TOWEL OR 17X26 10 PK STRL BLUE (TOWEL DISPOSABLE) ×3 IMPLANT
TRAY CATH 16FR W/PLASTIC CATH (SET/KITS/TRAYS/PACK) IMPLANT
TRAY FOLEY CATH 16FRSI W/METER (SET/KITS/TRAYS/PACK) ×3 IMPLANT
WATER STERILE IRR 1000ML POUR (IV SOLUTION) IMPLANT

## 2016-09-01 NOTE — Transfer of Care (Signed)
Immediate Anesthesia Transfer of Care Note  Patient: Carol Crosby  Procedure(s) Performed: Procedure(s): RIGHT TOTAL HIP ARTHROPLASTY ANTERIOR APPROACH and RIGHT WRIST ASPIRATION AND STEROID INJECTION (Right)  Patient Location: PACU  Anesthesia Type:General  Level of Consciousness: awake and alert   Airway & Oxygen Therapy: Patient Spontanous Breathing and Patient connected to face mask oxygen  Post-op Assessment: Report given to RN, Post -op Vital signs reviewed and stable and Patient moving all extremities X 4  Post vital signs: Reviewed and stable  Last Vitals:  Vitals:   09/01/16 1147 09/01/16 1556  BP: 136/65 119/63  Pulse: 93 74  Resp: 20 11  Temp: 36.7 C 36.6 C    Last Pain:  Vitals:   09/01/16 1147  TempSrc: Oral  PainSc:       Patients Stated Pain Goal: 3 ([id number])  Complications: No apparent anesthesia complications

## 2016-09-01 NOTE — Anesthesia Preprocedure Evaluation (Addendum)
Anesthesia Evaluation  Patient identified by MRN, date of birth, ID band Patient awake    Reviewed: Allergy & Precautions, NPO status , Patient's Chart, lab work & pertinent test results  Airway Mallampati: III  TM Distance: >3 FB Neck ROM: Full    Dental no notable dental hx. (+) Teeth Intact, Dental Advisory Given   Pulmonary sleep apnea and Continuous Positive Airway Pressure Ventilation , former smoker,    Pulmonary exam normal breath sounds clear to auscultation       Cardiovascular hypertension, Pt. on medications (-) angina(-) CAD, (-) Past MI and (-) CHF Normal cardiovascular exam Rhythm:Regular Rate:Normal     Neuro/Psych  Headaches, negative psych ROS   GI/Hepatic Neg liver ROS,   Endo/Other  Morbid obesity  Renal/GU negative Renal ROS  negative genitourinary   Musculoskeletal  (+) Arthritis , Osteoarthritis,    Abdominal (+) + obese,   Peds  Hematology negative hematology ROS (+) Plt 291k   Anesthesia Other Findings Day of surgery medications reviewed with the patient.  Reproductive/Obstetrics negative OB ROS                            Anesthesia Physical  Anesthesia Plan  ASA: III  Anesthesia Plan: General   Post-op Pain Management:    Induction: Intravenous  Airway Management Planned: Oral ETT  Additional Equipment:   Intra-op Plan:   Post-operative Plan: Extubation in OR  Informed Consent: I have reviewed the patients History and Physical, chart, labs and discussed the procedure including the risks, benefits and alternatives for the proposed anesthesia with the patient or authorized representative who has indicated his/her understanding and acceptance.   Dental advisory given  Plan Discussed with: CRNA, Anesthesiologist and Surgeon  Anesthesia Plan Comments:         Anesthesia Quick Evaluation

## 2016-09-01 NOTE — Brief Op Note (Signed)
09/01/2016  3:30 PM  PATIENT:  Carol Crosby  55 y.o. female  PRE-OPERATIVE DIAGNOSIS:  Osteoarthritis right hip, ganglion cyst right wrist  POST-OPERATIVE DIAGNOSIS:  * No post-op diagnosis entered *  PROCEDURE:  Procedure(s): RIGHT TOTAL HIP ARTHROPLASTY ANTERIOR APPROACH and RIGHT WRIST ASPIRATION AND STEROID INJECTION (Right)  SURGEON:  Surgeon(s) and Role:    * Mcarthur Rossetti, MD - Primary  PHYSICIAN ASSISTANT: Benita Stabile, PA-C  ANESTHESIA:   general  EBL:  Total I/O In: 1000 [I.V.:1000] Out: 450 [Urine:50; Blood:400]  COUNTS:  YES  DICTATION: .Other Dictation: Dictation Number [id number]  PLAN OF CARE: Admit to inpatient   PATIENT DISPOSITION:  PACU - hemodynamically stable.   Delay start of Pharmacological VTE agent (>24hrs) due to surgical blood loss or risk of bleeding: no

## 2016-09-01 NOTE — H&P (Signed)
TOTAL HIP ADMISSION H&P  Patient is admitted for right total hip arthroplasty.  Subjective:  Chief Complaint: right hip pain  HPI: Carol Crosby, 55 y.o. female, has a history of pain and functional disability in the right hip(s) due to arthritis and patient has failed non-surgical conservative treatments for greater than 12 weeks to include NSAID's and/or analgesics, corticosteriod injections, flexibility and strengthening excercises, supervised PT with diminished ADL's post treatment, use of assistive devices, weight reduction as appropriate and activity modification.  Onset of symptoms was abrupt starting 1 years ago with rapidlly worsening course since that time.The patient noted no past surgery on the right hip(s).  Patient currently rates pain in the right hip at 10 out of 10 with activity. Patient has night pain, worsening of pain with activity and weight bearing, trendelenberg gait, pain that interfers with activities of daily living and pain with passive range of motion. Patient has evidence of subchondral sclerosis, periarticular osteophytes and joint space narrowing by imaging studies. This condition presents safety issues increasing the risk of falls.  There is no current active infection.  Patient Active Problem List   Diagnosis Date Noted  . Unilateral primary osteoarthritis, right hip 04/21/2016  . 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 Viral URI with cough 12/31/2015  . Chronic insomnia 12/31/2015  . Acute sinusitis 11/20/2015  . Lipoma 12/10/2014  . Right shoulder pain 12/10/2014  . Severe headache 01/23/2014  . HTN (hypertension), benign 01/09/2014  . Right knee pain 01/09/2014  . Throat irritation 01/09/2014  . OBSTRUCTIVE SLEEP APNEA 01/14/2009  . Morbid obesity ([place]) 11/30/2007  . INCONTINENCE, MIXED, URGE/STRESS 11/30/2007   Past Medical History:  Diagnosis Date  . Abdominal pain, right upper quadrant   . Acute sinusitis, unspecified   . Arthritis     knees, back, hip   . Esophageal reflux   . Excessive or frequent menstruation   . History of bronchitis   . Hypersomnia, unspecified   . Hypertension   . Mixed incontinence urge and stress (female)(female)   . Morbid obesity ([place])   . Obstructive sleep apnea (adult) (pediatric)   . Other chest pain   . Palpitations   . Scarlet fever   . Streptococcal sore throat   . Temporomandibular joint disorders, unspecified   . Unspecified urinary incontinence     Past Surgical History:  Procedure Laterality Date  . CESAREAN SECTION  2005  . CHOLECYSTECTOMY    . EXTREMITY CYST EXCISION  2000   Wrist  . EYE SURGERY     lasik- both eyes   . FRACTURE SURGERY     R ankle- screw for fixation   . KNEE ARTHROSCOPY Left 2007  . [place]  . [place]  . TONSILLECTOMY    . TOTAL HIP ARTHROPLASTY Left 04/21/2016   Procedure: LEFT TOTAL HIP ARTHROPLASTY ANTERIOR APPROACH;  Surgeon: Mcarthur Rossetti, MD;  Location: [facility];  Service: Orthopedics;  Laterality: Left;  . TUBAL LIGATION      Prescriptions Prior to Admission  Medication Sig Dispense Refill Last Dose  . celecoxib (CELEBREX) 200 MG capsule Take 1 capsule by mouth daily.   Past Week at Unknown time  . losartan-hydrochlorothiazide (HYZAAR) 50-12.5 MG tablet Take 1 tablet by mouth daily. 30 tablet 11 08/31/2016 at Unknown time  . methocarbamol (ROBAXIN) 500 MG tablet Take 1 tablet (500 mg total) by mouth every 6 (six) hours as needed for muscle spasms. 60 tablet 0  Past Week at Unknown time  . oxyCODONE-acetaminophen (ROXICET) 5-325 MG tablet Take 1-2 tablets by mouth every 4 (four) hours as needed. 60 tablet 0 months ago  . traMADol (ULTRAM) 50 MG tablet Take 1 tablet by mouth daily.   Past Week at Unknown time  . traZODone (DESYREL) 50 MG tablet TAKE ONE TABLET BY MOUTH AT BEDTIME 30 tablet 0 08/31/2016 at Unknown time  . acetaminophen (TYLENOL) 500 MG tablet Take 500 mg by mouth every 6 (six)  hours as needed for mild pain.   Unknown at Unknown time  . aspirin EC 325 MG EC tablet Take 1 tablet (325 mg total) by mouth 2 (two) times daily after a meal. 30 tablet 0 months ago  . HYDROcodone-acetaminophen (NORCO/VICODIN) 5-325 MG tablet Take 1 tablet by mouth. Takes mainly at bedtime   months ago   Allergies  Allergen Reactions  . Erythromycin Base Nausea And Vomiting    Social History  Substance Use Topics  . Smoking status: Former Smoker    Quit date: 10/26/2001  . Smokeless tobacco: Never Used  . Alcohol use No    Family History  Problem Relation Age of Onset  . Diabetes Father   . Migraines Mother   . Hypertension Mother   . Fibromyalgia Sister   . Colon cancer Maternal Grandmother   . Other Brother     ? Heart issue     Review of Systems  Musculoskeletal: Positive for joint pain.  All other systems reviewed and are negative.   Objective:  Physical Exam  Constitutional: She is oriented to person, place, and time. She appears well-developed and well-nourished.  HENT:  Head: Normocephalic and atraumatic.  Eyes: EOM are normal. Pupils are equal, round, and reactive to light.  Neck: Normal range of motion. Neck supple.  Cardiovascular: Normal rate and regular rhythm.   Respiratory: Effort normal and breath sounds normal.  GI: Soft. Bowel sounds are normal.  Musculoskeletal:       Right hip: She exhibits decreased range of motion, decreased strength, tenderness and bony tenderness.  Neurological: She is alert and oriented to person, place, and time.  Skin: Skin is warm and dry.  Psychiatric: She has a normal mood and affect.    Vital signs in last 24 hours: Temp:  [98 F (36.7 C)] 98 F (36.7 C) (11/07 1147) Pulse Rate:  [93] 93 (11/07 1147) Resp:  [20] 20 (11/07 1147) BP: (136)/(65) 136/65 (11/07 1147) SpO2:  [96 %] 96 % (11/07 1147) Weight:  [256 lb 13.4 oz (116.5 kg)] 256 lb 13.4 oz (116.5 kg) (11/07 1147)  Labs:   Estimated body mass index is  46.98 kg/m (pended) as calculated from the following:   Height as of 08/27/16: (P) 5\' 2"  (1.575 m).   Weight as of this encounter: 256 lb 13.4 oz (116.5 kg).   Imaging Review Plain radiographs demonstrate moderate degenerative joint disease of the right hip(s). The bone quality appears to be excellent for age and reported activity level.  Assessment/Plan:  End stage arthritis, right hip(s)  The patient history, physical examination, clinical judgement of the provider and imaging studies are consistent with end stage degenerative joint disease of the right hip(s) and total hip arthroplasty is deemed medically necessary. The treatment options including medical management, injection therapy, arthroscopy and arthroplasty were discussed at length. The risks and benefits of total hip arthroplasty were presented and reviewed. The risks due to aseptic loosening, infection, stiffness, dislocation/subluxation,  thromboembolic complications and other imponderables were  discussed.  The patient acknowledged the explanation, agreed to proceed with the plan and consent was signed. Patient is being admitted for inpatient treatment for surgery, pain control, PT, OT, prophylactic antibiotics, VTE prophylaxis, progressive ambulation and ADL's and discharge planning.The patient is planning to be discharged home with home health services

## 2016-09-01 NOTE — Anesthesia Postprocedure Evaluation (Signed)
Anesthesia Post Note  Patient: Carol Crosby  Procedure(s) Performed: Procedure(s) (LRB): RIGHT TOTAL HIP ARTHROPLASTY ANTERIOR APPROACH and RIGHT WRIST ASPIRATION AND STEROID INJECTION (Right)  Patient location during evaluation: PACU Anesthesia Type: General Level of consciousness: sedated Pain management: pain level controlled Vital Signs Assessment: post-procedure vital signs reviewed and stable Respiratory status: spontaneous breathing and respiratory function stable Cardiovascular status: stable Anesthetic complications: no    Last Vitals:  Vitals:   09/01/16 1630 09/01/16 1645  BP: 122/77 120/61  Pulse: 84 67  Resp: 20 12  Temp:      Last Pain:  Vitals:   09/01/16 1645  TempSrc:   PainSc: 9                  Katha Kuehne DANIEL

## 2016-09-01 NOTE — Anesthesia Procedure Notes (Signed)
Procedure Name: Intubation Date/Time: 09/01/2016 2:05 PM Performed by: Rejeana Brock L Pre-anesthesia Checklist: Patient identified, Emergency Drugs available, Suction available and Patient being monitored Patient Re-evaluated:Patient Re-evaluated prior to inductionOxygen Delivery Method: Circle System Utilized Preoxygenation: Pre-oxygenation with 100% oxygen Intubation Type: IV induction Ventilation: Mask ventilation without difficulty and Oral airway inserted - appropriate to patient size Laryngoscope Size: Glidescope and 4 Grade View: Grade I Tube type: Oral Tube size: 7.5 mm Number of attempts: 1 Airway Equipment and Method: Stylet,  Oral airway and Rigid stylet Placement Confirmation: ETT inserted through vocal cords under direct vision,  positive ETCO2 and breath sounds checked- equal and bilateral Secured at: 22 cm Tube secured with: Tape Dental Injury: Teeth and Oropharynx as per pre-operative assessment  Difficulty Due To: Difficulty was anticipated and Difficult Airway- due to anterior larynx Future Recommendations: Recommend- induction with short-acting agent, and alternative techniques readily available Comments: DL X 1 with MAC 3 blade and grade III view, DL X 2 with Miller 2, Grade III view, Glidescope MAC 4 blade with Grade I view

## 2016-09-02 ENCOUNTER — Encounter (HOSPITAL_COMMUNITY): Payer: Self-pay | Admitting: Orthopaedic Surgery

## 2016-09-02 LAB — CBC
HCT: 36.5 % (ref 36.0–46.0)
Hemoglobin: 11.6 g/dL — ABNORMAL LOW (ref 12.0–15.0)
MCH: 29.4 pg (ref 26.0–34.0)
MCHC: 31.8 g/dL (ref 30.0–36.0)
MCV: 92.4 fL (ref 78.0–100.0)
PLATELETS: 252 10*3/uL (ref 150–400)
RBC: 3.95 MIL/uL (ref 3.87–5.11)
RDW: 14.4 % (ref 11.5–15.5)
WBC: 14.2 10*3/uL — ABNORMAL HIGH (ref 4.0–10.5)

## 2016-09-02 LAB — BASIC METABOLIC PANEL
Anion gap: 6 (ref 5–15)
BUN: 11 mg/dL (ref 6–20)
CALCIUM: 8.5 mg/dL — AB (ref 8.9–10.3)
CO2: 28 mmol/L (ref 22–32)
CREATININE: 0.62 mg/dL (ref 0.44–1.00)
Chloride: 104 mmol/L (ref 101–111)
GFR calc non Af Amer: >60 mL/min (ref >60)
Glucose, Bld: 125 mg/dL — ABNORMAL HIGH (ref 65–99)
Potassium: 4.5 mmol/L (ref 3.5–5.1)
SODIUM: 138 mmol/L (ref 135–145)

## 2016-09-02 NOTE — Evaluation (Signed)
Occupational Therapy Evaluation Patient Details Name: Carol Crosby MRN: [id number] DOB: January 31, 1961 Today's Date: 09/02/2016    History of Present Illness Pt is a 55 yo female admitted for R anterior THR and R wrist aspiration and steroid injection for ganglion cyst.  Pt had L anterior total hip in June.     Clinical Impression   Pt admitted with the above diagnosis and has the deficits outline below. Pt would benefit from cont OT to address tub bench transfers and increase independence with LE dressing so she can d/c home with her husband who will only be available through the weekend to assist before he returns to work. Pt is very familiar with recover from this surgery as she just had it done in June and is progressing well.  No post acute OT needs at this time.     Follow Up Recommendations  No OT follow up;Supervision - Intermittent    Equipment Recommendations  3 in 1 bedside comode    Recommendations for Other Services       Precautions / Restrictions Precautions Precautions: Anterior Hip Restrictions Weight Bearing Restrictions: Yes      Mobility Bed Mobility Overal bed mobility: Needs Assistance Bed Mobility: Supine to Sit     Supine to sit: Min assist;HOB elevated     General bed mobility comments: Pt required assist to move R leg to EOB.  Transfers Overall transfer level: Needs assistance Equipment used: Rolling walker (2 wheeled) Transfers: Sit to/from Stand Sit to Stand: Min guard         General transfer comment: Pt with good hand placement on walker.      Balance Overall balance assessment: Needs assistance Sitting-balance support: Feet supported Sitting balance-Leahy Scale: Good     Standing balance support: Bilateral upper extremity supported;During functional activity Standing balance-Leahy Scale: Fair Standing balance comment: Pt could let go of walker for short amounts of time to toilet and to groom at sink without LOB.                             ADL Overall ADL's : Needs assistance/impaired Eating/Feeding: Independent;Sitting   Grooming: Wash/dry hands;Wash/dry face;Oral care;Brushing hair;Supervision/safety;Standing   Upper Body Bathing: Set up;Sitting   Lower Body Bathing: Minimal assistance;Sit to/from stand Lower Body Bathing Details (indicate cue type and reason): pt with difficulty reaching lower leg on the R. Upper Body Dressing : Set up;Sitting   Lower Body Dressing: Moderate assistance;Sit to/from stand Lower Body Dressing Details (indicate cue type and reason): assist with socks and shoes and getting pants started over R leg. Toilet Transfer: Min guard;RW;BSC;Comfort height toilet;Ambulation Toilet Transfer Details (indicate cue type and reason): Pt walked to bathroom with min guard.  Unable to get onto comfort commode so 3:1 placed over the top which made things much easier. Toileting- Clothing Manipulation and Hygiene: Supervision/safety;Sit to/from stand       Functional mobility during ADLs: Min guard;Rolling walker;Cueing for sequencing General ADL Comments: Pt did very well overall with adls considering this was her first time up since surgery.  Pt familiar with all techniques from hip surgery in 03/2016     Vision Vision Assessment?: No apparent visual deficits   Perception     Praxis      Pertinent Vitals/Pain Pain Assessment: 0-10 Pain Score: 4  Pain Location: R hip Pain Descriptors / Indicators: Aching;Operative site guarding Pain Intervention(s): Limited activity within patient's tolerance;Monitored during session;Premedicated before session;Repositioned;Ice applied  Hand Dominance Right   Extremity/Trunk Assessment Upper Extremity Assessment Upper Extremity Assessment: Overall WFL for tasks assessed   Lower Extremity Assessment Lower Extremity Assessment: Defer to PT evaluation   Cervical / Trunk Assessment Cervical / Trunk Assessment: Normal   Communication  Communication Communication: No difficulties   Cognition Arousal/Alertness: Awake/alert Behavior During Therapy: WFL for tasks assessed/performed Overall Cognitive Status: Within Functional Limits for tasks assessed                     General Comments       Exercises       Shoulder Instructions      Home Living Family/patient expects to be discharged to:: Private residence Living Arrangements: Spouse/significant other Available Help at Discharge: Family;Available 24 hours/day;Other (Comment) (available 24 hours a day just through the weekend.) Type of Home: House Home Access: Stairs to enter [employer] of Steps: 5 Entrance Stairs-Rails: None Home Layout: One level     Bathroom Shower/Tub: Tub/shower unit Shower/tub characteristics: Curtain Biochemist, clinical: Standard     Home Equipment: Environmental consultant - 2 wheels;Tub bench   Additional Comments: needs 3:1      Prior Functioning/Environment Level of Independence: Independent                 OT Problem List: Decreased knowledge of use of DME or AE;Pain;Increased edema   OT Treatment/Interventions: Self-care/ADL training;DME and/or AE instruction;Therapeutic activities    OT Goals(Current goals can be found in the care plan section) Acute Rehab OT Goals Patient Stated Goal: to go home Friday. OT Goal Formulation: With patient Time For Goal Achievement: 09/09/16 Potential to Achieve Goals: Good ADL Goals Pt Will Perform Lower Body Dressing: with min guard assist;sit to/from stand Pt Will Perform Tub/Shower Transfer: Tub transfer;with supervision;ambulating;tub bench;rolling walker  OT Frequency: Min 2X/week   Barriers to D/C:    family will be home through the weekend only to assist.       Co-evaluation              End of Session Equipment Utilized During Treatment: Rolling walker Nurse Communication: Mobility status;Other (comment) (O2 sats 94% Nursing said to leave O2  off.)  Activity Tolerance: Patient tolerated treatment well Patient left: in chair;with call bell/phone within reach   Time: [id number] OT Time Calculation (min): 38 min Charges:  OT General Charges $OT Visit: 1 Procedure OT Evaluation $OT Eval Moderate Complexity: 1 Procedure OT Treatments $Self Care/Home Management : 8-22 mins G-Codes:    Glenford Peers Sep 17, 2016, 9:52 AM  [phone]

## 2016-09-02 NOTE — Care Management Note (Signed)
Case Management Note  Patient Details  Name: Carol Crosby MRN: [id number] Date of Birth: 1961/09/12  Subjective/Objective: 55 yr old female s/p right total hip arthroplasty.                   Action/Plan: Case manager spoke with patient concerning discharge plan. Patient was preoperatively setup with [facility], no changes. Patient states she has rolling walker already but needs a 3in1. CM has ordered one. Patient states her family will assist her in the evenings.    Expected Discharge Date:   09/03/16               Expected Discharge Plan:  [place]  In-House Referral:     Discharge planning Services  CM Consult  Post Acute Care Choice:  Durable Medical Equipment, Home Health Choice offered to:  Patient  DME Arranged:  3-N-1 DME Agency:  [place]:  PT [place]:  [facility] (now [facility])  Status of Service:  Completed, signed off  If discussed at [place] of Stay Meetings, dates discussed:    Additional Comments:  Ninfa Meeker, RN 09/02/2016, 1:24 PM

## 2016-09-02 NOTE — Progress Notes (Signed)
Physical Therapy Treatment Patient Details Name: Carol Crosby MRN: [id number] DOB: 07-Jun-1961 Today's Date: 09/02/2016    History of Present Illness Pt is a 55 yo female admitted for R anterior THR and R wrist aspiration and steroid injection for ganglion cyst.  Pt had L anterior total hip in June.      PT Comments    Pt presented awake and alert supine in bed. Participated in therapeutic exercises while in bed, and showed increase ROM and repetition in RLE. Progressed gait distance and showed improved balance when using commode and sink. Pt is progressing towards goals of being d/c home Friday, with addition of adequate stair training/education.       Follow Up Recommendations   HHPT     Equipment Recommendations  3in1 commode    Recommendations for Other Services  Consider OT consult     Precautions / Restrictions Restrictions Weight Bearing Restrictions: Yes RLE Weight Bearing: Weight bearing as tolerated    Mobility  Bed Mobility Overal bed mobility: Needs Assistance Bed Mobility: Supine to Sit;Sit to Supine     Supine to sit: Modified independent (Device/Increase time) Sit to supine: Mod assist   General bed mobility comments: Pt required assist to move R leg into bed from sitting. Required no cues for supine to sitting.   Transfers Overall transfer level: Needs assistance Equipment used: Rolling walker (2 wheeled) Transfers: Sit to/from Stand Sit to Stand: Min guard         General transfer comment: Pt had proper hand placement for sit to stand transfer.   Ambulation/Gait Ambulation/Gait assistance: Supervision Ambulation Distance (Feet): 100 Feet Assistive device: Rolling walker (2 wheeled) Gait Pattern/deviations: Step-through pattern;Decreased stance time - right    Comment: Pt progressed in gait distance with no cues but remained close for pt safety.      Stairs            Wheelchair Mobility    Modified Rankin (Stroke Patients Only)        Balance Overall balance assessment: Needs assistance Sitting-balance support: Feet supported       Standing balance support: Bilateral upper extremity supported Standing balance-Leahy Scale: Fair Standing balance comment: Pt could let go of walker for short amounts of time to toilet and to groom at sink without LOB                    Cognition Arousal/Alertness: Awake/alert Behavior During Therapy: WFL for tasks assessed/performed Overall Cognitive Status: Within Functional Limits for tasks assessed                      Exercises Total Joint Exercises Quad Sets: AROM;Right;10 reps Gluteal Sets: 10 reps;Both Heel Slides: AAROM;Right;10 reps Hip ABduction/ADduction: AAROM;10 reps;Right    General Comments        Pertinent Vitals/Pain Pain Assessment: 0-10 Pain Score: 5  Pain Location: R hip  Pain Descriptors / Indicators: Grimacing;Sore Pain Intervention(s): Limited activity within patient's tolerance;Monitored during session    [place] expects to be discharged to:: Private residence Living Arrangements: Spouse/significant other;Children Available Help at Discharge: Family Type of Home: House Home Access: Stairs to enter Entrance Stairs-Rails: None Home Layout: One level Home Equipment: Environmental consultant - 2 wheels      Prior Function Level of Independence: Independent          PT Goals (current goals can now be found in the care plan section) Acute Rehab PT Goals Patient Stated Goal: to go  home Friday.    Frequency    7X/week      PT Plan Current plan remains appropriate    Co-evaluation             End of Session Equipment Utilized During Treatment: Gait belt Activity Tolerance: Patient tolerated treatment well Patient left: in bed;with call bell/phone within reach;with SCD's reapplied     Time: [id number] PT Time Calculation (min) (ACUTE ONLY): 37 min  Charges:     09/02/16 1500  PT Time Calculation  PT  Start Time (ACUTE ONLY) 1451  PT Stop Time (ACUTE ONLY) 1528  PT Time Calculation (min) (ACUTE ONLY) 37 min  PT General Charges  $$ ACUTE PT VISIT 1 Procedure  PT Treatments  $Gait Training 8-22 mins  $Therapeutic Exercise 8-22 mins                      G Codes:      Shella Maxim, SPT Acute Rehabilitation Services Office: [phone]  Roderic Palau 09/02/2016, 3:43 PM   Roney Marion, [place] Pager [phone] Office [phone]

## 2016-09-02 NOTE — Progress Notes (Signed)
Subjective: 1 Day Post-Op Procedure(s) (LRB): RIGHT TOTAL HIP ARTHROPLASTY ANTERIOR APPROACH and RIGHT WRIST ASPIRATION AND STEROID INJECTION (Right) Patient reports pain as moderate.    Objective: Vital signs in last 24 hours: Temp:  [97.8 F (36.6 C)-98.4 F (36.9 C)] 97.8 F (36.6 C) (11/08 0704) Pulse Rate:  [62-97] 80 (11/08 0704) Resp:  [7-21] 16 (11/08 0704) BP: (105-136)/(53-94) 127/61 (11/08 0704) SpO2:  [90 %-100 %] 93 % (11/08 0704) Weight:  [256 lb 13.4 oz (116.5 kg)] 256 lb 13.4 oz (116.5 kg) (11/07 1147)  Intake/Output from previous day: 11/07 0701 - 11/08 0700 In: 1700 [I.V.:1700] Out: 1225 [Urine:825; Blood:400] Intake/Output this shift: Total I/O In: -  Out: 250 [Urine:250]  No results for input(s): HGB in the last 72 hours. No results for input(s): WBC, RBC, HCT, PLT in the last 72 hours. No results for input(s): NA, K, CL, CO2, BUN, CREATININE, GLUCOSE, CALCIUM in the last 72 hours. No results for input(s): LABPT, INR in the last 72 hours.  Sensation intact distally Intact pulses distally Dorsiflexion/Plantar flexion intact Incision: scant drainage  Assessment/Plan: 1 Day Post-Op Procedure(s) (LRB): RIGHT TOTAL HIP ARTHROPLASTY ANTERIOR APPROACH and RIGHT WRIST ASPIRATION AND STEROID INJECTION (Right) Up with therapy  Carol Crosby 09/02/2016, 7:14 AM

## 2016-09-02 NOTE — Evaluation (Signed)
Physical Therapy Evaluation Patient Details Name: Carol Crosby MRN: [id number] DOB: 02-18-1961 Today's Date: 09/02/2016   History of Present Illness  Pt is a 55 yo female admitted for R anterior THR and R wrist aspiration and steroid injection for ganglion cyst.  Pt had L anterior total hip in June.    Clinical Impression  Pt was admitted with R direct anterior THA. Pt presented in recliner, awake, alert and participated fully. Pt was knowledgeable in hand placement for sit to stand transfer technique but required min guard for safety. Pt would benefit from PT to increase strength, complete stair training and increase mobility to be d/c to home and be alone for bouts of time.     Follow Up Recommendations  HHPT    Equipment Recommendations  3 in 1 Commode   Recommendations for Other Services   OT Consult    Precautions / Restrictions Restrictions Weight Bearing Restrictions: Yes RLE Weight Bearing: Weight bearing as tolerated      Mobility  Bed Mobility                  Transfers Overall transfer level: Needs assistance Equipment used: Rolling walker (2 wheeled) Transfers: Sit to/from Stand Sit to Stand: Min guard         General transfer comment: Pt had proper hand placement for sit to stand transfer.   Ambulation/Gait Ambulation/Gait assistance: Supervision Ambulation Distance (Feet): 60 Feet Assistive device: Rolling walker (2 wheeled) Gait Pattern/deviations: Step-through pattern;Decreased stance time - right      Comment: Cued to self monitor for activity tolerance and pain level. Adjusted RW height for optimal fit.  Stairs            Wheelchair Mobility    Modified Rankin (Stroke Patients Only)       Balance                                             Pertinent Vitals/Pain Pain Assessment: 0-10 Pain Score: 6  Pain Location: R hip Pain Descriptors / Indicators: Sore Pain Intervention(s): Monitored during  session;Limited activity within patient's tolerance;Repositioned    Home Living Family/patient expects to be discharged to:: Private residence Living Arrangements: Spouse/significant other;Children Available Help at Discharge: Family Type of Home: House Home Access: Stairs to enter Entrance Stairs-Rails: None Entrance Stairs-Number of Steps: 5 platform steps  Home Layout: One level Home Equipment: Environmental consultant - 2 wheels      Prior Function Level of Independence: Independent               Hand Dominance        Extremity/Trunk Assessment               Lower extremity assessment: Muscle activation and control around new hip present. ROM grossly decreased limited by post op pain.         Communication      Cognition Arousal/Alertness: Awake/alert Behavior During Therapy: WFL for tasks assessed/performed Overall Cognitive Status: Within Functional Limits for tasks assessed                      General Comments      Exercises Total Joint Exercises Quad Sets: AROM;Right;5 reps Heel Slides: AAROM;Right;Other reps (comment) (2 ) Hip ABduction/ADduction: AROM;Right (2)   Assessment/Plan    PT Assessment Patient needs continued PT services  PT  Problem List Decreased strength;Decreased range of motion;Decreased mobility;Obesity;Pain          PT Treatment Interventions Gait training;Stair training;Therapeutic exercise;.Functional mobility training;Therapeutic activities    PT Goals (Current goals can be found in the Care Plan section)  Acute Rehab PT Goals Patient Stated Goal: to go home Friday.    Frequency 7X/week   Barriers to discharge        Co-evaluation               End of Session Equipment Utilized During Treatment: Gait belt Activity Tolerance: Patient tolerated treatment well Patient left: in chair;with call bell/phone within reach           Time: 1128-1152 PT Time Calculation (min) (ACUTE ONLY): 24 min   Charges:    PT Evaluation $PT Eval Low Complexity: 1 Procedure PT Treatments $Gait Training: 8-22 mins   PT G Codes:       Ave Filter, SPT Acute Rehabilitation Services Office: [phone]  Ave Filter 09/02/2016, 1:59 PM   Roney Marion, [place] Pager [phone] Office [phone]

## 2016-09-02 NOTE — Op Note (Signed)
NAMETESSA, LAURY NO.:  [id number]  MEDICAL RECORD NO.:  [id number]  LOCATION:  [id number]                        FACILITY:  [facility]  PHYSICIAN:  Lind Guest. Ninfa Linden, M.D.DATE OF BIRTH:  January 20, 1961  DATE OF PROCEDURE:  09/01/2016 DATE OF DISCHARGE:                              OPERATIVE REPORT   PREOPERATIVE DIAGNOSIS:  Primary osteoarthritis and degenerative joint disease of right hip.  POSTOPERATIVE DIAGNOSIS:  Primary osteoarthritis and degenerative joint disease of right hip.  PROCEDURE:  Right total hip arthroplasty through direct anterior approach.  IMPLANTS:  DePuy Sector Gription acetabular component size 48, size 32+ 4 neutral polyethylene liner, size 11 Corail femoral component with standard offset, size 32+ 1 ceramic hip ball.  SURGEON:  Lind Guest. Ninfa Linden, M.D.  ASSISTANT:  Erskine Emery, PA-C.  ANESTHESIA:  General.  ANTIBIOTICS:  2 g of IV Ancef.  BLOOD LOSS:  400 mL.  COMPLICATIONS:  None.  INDICATIONS:  [name] is a 55 year old female, well known to me.  She has debilitating pain and arthritis involving both her hips, and in June of this year underwent successful left total hip arthroplasty.  Her right hip has been severely painful to her, detrimentally effected her activities of daily living, mobility, and quality of life.  At this point, she wish to proceed with total hip arthroplasty given the success on her left hip.  Her pain is daily.  It has detrimentally effected her activities of daily living, her quality of life, and her mobility.  She understands the risk of acute blood loss anemia, nerve and vessel injury, fracture, infection, dislocation, and DVT.  She understands our goals are decreased pain, improved mobility, and overall improved quality of life.  PROCEDURE DESCRIPTION:  After informed consent was obtained, appropriate right hip was marked.  She was brought to the operating room, and general anesthesia was  obtained while she was on her stretcher.  A Foley catheter was placed and then we placed traction boots on both her feet. First, I did clean up a cystic area on the dorsum of her right wrist with Betadine and alcohol, and as we consent, we aspirated gelatinous material from her right wrist dorsal ganglion.  I then placed injection of 1 mL of lidocaine and 1 mL of Depo-Medrol, and placed a Band-Aid on this.  We did call a time-out for that part of the case.  We then placed her supine on the Hana fracture table with the perineal post in place and both legs in inline skeletal traction devices, but no traction were applied.  Her left operative hip was prepped and draped with DuraPrep and sterile drapes.  Time-out was called to identify correct patient and correct left hip.  We then made an incision inferior and posterior to the anterior superior iliac spine and carried this obliquely down the leg.  We dissected down to tensor fascia lata muscle.  The tensor fascia was then divided longitudinally to proceed with a direct anterior approach to the hip.  We identified and cauterized the circumflex vessels and identified the hip capsule.  I opened up the hip capsule in an L-type format finding a large joint effusion as  well as significant synovitis in the hip as well as cartilage changes.  We placed Cobra retractors around the medial and lateral femoral neck, and after cauterized the circumflex vessels, I made a femoral neck cut proximal to the lesser trochanter with an oscillating saw and completed this on osteotome.  We placed a corkscrew guide in the femoral head and removed the femoral head in its entirety.  We then placed a bent Hohmann over the medial acetabular rim and cleaned remnants of acetabular labrum.  We then began reaming from a size 42 reamer in stepwise increments to a size 48 with all reamers under direct visualization, the last reamer under direct fluoroscopy, so we could obtain  our depth of reaming, our inclination, and anteversion.  Once I was pleased with this, I placed the real DePuy Sector Gription acetabular component size 48 and a 32+ 4 neutral polyethylene liner for that size of acetabular component. Attention was then turned to the femur with the leg externally rotated to 120 degrees extended and adducted, we were able to place a Mueller retractor medially and a Hohmann retractor behind the greater trochanter.  We released the lateral joint capsule and used a box cutting osteotome to enter the femoral canal and a rongeur to lateralize.  I then began broaching from a size 8 broach using the Corail broaching system from a size 11.  With the size 11 in place, we trialed a standard offset femoral neck and a 32+ 1 hip ball, and brought the leg back over and up with traction and rotation reducing the pelvis. We were pleased with offset leg length and stability.  We then dislocated the hip and removed the trial components.  We were able to place the real Corail femoral component with standard offset of size 11 and the real 32+ 1 ceramic hip ball reduced this in the acetabulum and again it was stable.  We then irrigated the soft tissue with normal saline solution using pulsatile lavage.  We closed the joint capsule with interrupted #1 Ethibond suture followed by running #1 Vicryl in the tensor fascia, 0 Vicryl in the deep tissue, 2-0 Vicryl in the subcutaneous tissue, and then interrupted staples on the skin.  Xeroform and Aquacel dressings were applied.  She was taken off the Hana table, awakened, extubated, and taken to recovery room in stable condition. All final counts were correct.  There were no complications noted.  Of note, Erskine Emery, PA-C assisted in the entire case.  His assistance was crucial for facilitating all aspects of this case.     Lind Guest. Ninfa Linden, M.D.     CYB/MEDQ  D:  09/01/2016  T:  09/02/2016  Job:  [id number]

## 2016-09-02 NOTE — Progress Notes (Signed)
Set pt up on Cpap 8.0 CMH20 pt has brought her own full face mask from home.  Pt tolerating well no issues to report.

## 2016-09-03 ENCOUNTER — Encounter (HOSPITAL_COMMUNITY): Payer: Self-pay

## 2016-09-03 LAB — CBC
HEMATOCRIT: 35.5 % — AB (ref 36.0–46.0)
Hemoglobin: 11.1 g/dL — ABNORMAL LOW (ref 12.0–15.0)
MCH: 29.7 pg (ref 26.0–34.0)
MCHC: 31.3 g/dL (ref 30.0–36.0)
MCV: 94.9 fL (ref 78.0–100.0)
PLATELETS: 246 10*3/uL (ref 150–400)
RBC: 3.74 MIL/uL — ABNORMAL LOW (ref 3.87–5.11)
RDW: 14.9 % (ref 11.5–15.5)
WBC: 14.4 10*3/uL — AB (ref 4.0–10.5)

## 2016-09-03 MED ORDER — ONDANSETRON 4 MG PO TBDP: 4.0000 mg | ORAL_TABLET | Freq: Three times a day (TID) | ORAL | 0 refills | Status: DC | PRN

## 2016-09-03 MED ORDER — ASPIRIN 325 MG PO TBEC: 325.0000 mg | DELAYED_RELEASE_TABLET | Freq: Two times a day (BID) | ORAL | 0 refills | Status: DC

## 2016-09-03 MED ORDER — OXYCODONE-ACETAMINOPHEN 5-325 MG PO TABS: 1.0000 | ORAL_TABLET | ORAL | 0 refills | Status: DC | PRN

## 2016-09-03 NOTE — Progress Notes (Signed)
Physical Therapy Treatment Patient Details Name: Carol Crosby MRN: [id number] DOB: 29-Jan-1961 Today's Date: 09/03/2016    History of Present Illness Pt is a 55 yo female admitted for R anterior THR and R wrist aspiration and steroid injection for ganglion cyst.  Pt had L anterior total hip in June.      PT Comments    Patient tolerated short distance gait this session. Continues to be limited by pain/nausea. Patient needs to practice stairs next session if tolerated. Continue to progress as tolerated with anticipated d/c home with HHPT.   Follow Up Recommendations  Home health PT     Equipment Recommendations  Rolling walker with 5" wheels    Recommendations for Other Services       Precautions / Restrictions Precautions Precautions: Anterior Hip Restrictions Weight Bearing Restrictions: Yes RLE Weight Bearing: Weight bearing as tolerated    Mobility  Bed Mobility               General bed mobility comments: pt OOB in chair upon arrival  Transfers Overall transfer level: Needs assistance Equipment used: Rolling walker (2 wheeled) Transfers: Sit to/from Stand Sit to Stand: Min guard         General transfer comment: cues for safe hand placement  Ambulation/Gait Ambulation/Gait assistance: Min guard Ambulation Distance (Feet): 50 Feet Assistive device: Rolling walker (2 wheeled) Gait Pattern/deviations: Decreased stance time - right;Decreased step length - left;Decreased weight shift to right;Antalgic;Trunk flexed     General Gait Details: cues for posture and bilat heel strike   Stairs            Wheelchair Mobility    Modified Rankin (Stroke Patients Only)       Balance                                    Cognition Arousal/Alertness: Lethargic Behavior During Therapy: Flat affect Overall Cognitive Status: Within Functional Limits for tasks assessed                      Exercises      General Comments  General comments (skin integrity, edema, etc.): pt given HEP handout, reviewed, and encouraged to perform      Pertinent Vitals/Pain Pain Assessment: Faces Faces Pain Scale: Hurts little more Pain Location: R hip Pain Descriptors / Indicators: Aching;Guarding;Grimacing;Sore Pain Intervention(s): Limited activity within patient's tolerance;Monitored during session;Premedicated before session;Repositioned    Home Living                      Prior Function            PT Goals (current goals can now be found in the care plan section) Acute Rehab PT Goals Patient Stated Goal: feel better Progress towards PT goals: Progressing toward goals    Frequency    7X/week      PT Plan Current plan remains appropriate    Co-evaluation             End of Session Equipment Utilized During Treatment: Gait belt Activity Tolerance: Patient limited by pain;Other (comment) (nausea) Patient left: with call bell/phone within reach;in chair     Time: [id number] PT Time Calculation (min) (ACUTE ONLY): 22 min  Charges:  $Gait Training: 8-22 mins                    G Codes:  Salina April, PTA Pager: [phone]   09/03/2016, 3:39 PM

## 2016-09-03 NOTE — Discharge Instructions (Signed)

## 2016-09-03 NOTE — Progress Notes (Signed)
Subjective: 2 Days Post-Op Procedure(s) (LRB): RIGHT TOTAL HIP ARTHROPLASTY ANTERIOR APPROACH and RIGHT WRIST ASPIRATION AND STEROID INJECTION (Right) Patient reports pain as moderate.  Significant nausea and vomiting.  Objective: Vital signs in last 24 hours: Temp:  [98.3 F (36.8 C)-98.7 F (37.1 C)] 98.7 F (37.1 C) (11/09 0432) Pulse Rate:  [66-108] 108 (11/09 0432) Resp:  [12-18] 18 (11/09 0432) BP: (88-115)/(49-56) 115/56 (11/09 0432) SpO2:  [92 %-98 %] 92 % (11/09 0432)  Intake/Output from previous day: 11/08 0701 - 11/09 0700 In: 462 [P.O.:462] Out: 250 [Urine:250] Intake/Output this shift: No intake/output data recorded.   Recent Labs  09/02/16 0739 09/03/16 0534  HGB 11.6* 11.1*    Recent Labs  09/02/16 0739 09/03/16 0534  WBC 14.2* 14.4*  RBC 3.95 3.74*  HCT 36.5 35.5*  PLT 252 246    Recent Labs  09/02/16 0739  NA 138  K 4.5  CL 104  CO2 28  BUN 11  CREATININE 0.62  GLUCOSE 125*  CALCIUM 8.5*   No results for input(s): LABPT, INR in the last 72 hours.  Sensation intact distally Intact pulses distally Dorsiflexion/Plantar flexion intact Incision: scant drainage  Assessment/Plan: 2 Days Post-Op Procedure(s) (LRB): RIGHT TOTAL HIP ARTHROPLASTY ANTERIOR APPROACH and RIGHT WRIST ASPIRATION AND STEROID INJECTION (Right) Up with therapy Plan for discharge tomorrow Discharge home with home health  Mcarthur Rossetti 09/03/2016, 10:17 AM

## 2016-09-03 NOTE — Progress Notes (Signed)
Came to assess patient readiness for her CPAP QHS. Noted patient says feels nauseated. Soon thereafter patient started vomiting. Green Bag given to patient per RT for vomiting. Head of the bed was raise to prevent aspiration. RN notified that patient was actively vomiting currently at this time moment. CPAP will not be used tonight due to patient vomiting.

## 2016-09-03 NOTE — Progress Notes (Signed)
Physical Therapy Treatment Patient Details Name: Carol Crosby MRN: [id number] DOB: [birth year] Today's Date: 09/03/2016    History of Present Illness Pt is a 55 yo female admitted for R anterior THR and R wrist aspiration and steroid injection for ganglion cyst.  Pt had L anterior total hip in June.      PT Comments    Patient limited this session by pain/nausea/lethargy. Limited therex completed. Continue to progress as tolerated.   Follow Up Recommendations  Home health PT     Equipment Recommendations  Rolling walker with 5" wheels    Recommendations for Other Services       Precautions / Restrictions Precautions Precautions: Anterior Hip Restrictions Weight Bearing Restrictions: Yes RLE Weight Bearing: Weight bearing as tolerated    Mobility  Bed Mobility               General bed mobility comments: pt OOB in chair upon arrival  Transfers                    Ambulation/Gait                 Stairs            Wheelchair Mobility    Modified Rankin (Stroke Patients Only)       Balance                                    Cognition Arousal/Alertness: Lethargic Behavior During Therapy: Flat affect Overall Cognitive Status: Within Functional Limits for tasks assessed                      Exercises Total Joint Exercises Ankle Circles/Pumps: AROM;Both;10 reps Quad Sets: AROM;Right;5 reps Gluteal Sets: AROM;5 reps;Both Heel Slides: AAROM;Right;10 reps Hip ABduction/ADduction: AAROM;Right;5 reps    General Comments General comments (skin integrity, edema, etc.): pt reported nausea with emesis this am      Pertinent Vitals/Pain Pain Assessment: Faces Faces Pain Scale: Hurts whole lot Pain Location: R hip Pain Descriptors / Indicators: Grimacing;Guarding;Sharp;Sore Pain Intervention(s): Limited activity within patient's tolerance;Monitored during session;Premedicated before session;Repositioned;Ice applied     Home Living                      Prior Function            PT Goals (current goals can now be found in the care plan section) Acute Rehab PT Goals Patient Stated Goal: none stated Progress towards PT goals: Not progressing toward goals - comment (limited by pain/nausea/lethargy)    Frequency    7X/week      PT Plan Current plan remains appropriate    Co-evaluation             End of Session   Activity Tolerance: Patient limited by pain;Patient limited by lethargy Patient left: with call bell/phone within reach;in chair     Time: 1050-1107 PT Time Calculation (min) (ACUTE ONLY): 17 min  Charges:  $Therapeutic Exercise: 8-22 mins                    G Codes:      Salina April, PTA Pager: [phone]   09/03/2016, 11:18 AM

## 2016-09-04 NOTE — Progress Notes (Signed)
Physical Therapy Treatment Patient Details Name: Carol Crosby MRN: [id number] DOB: [birth year] Today's Date: 09/04/2016    History of Present Illness Pt is a 55 yo female admitted for R anterior THR and R wrist aspiration and steroid injection for ganglion cyst.  Pt had L anterior total hip in June.      PT Comments    Patient is progressing toward mobility goals. Tolerated increased gait distance and stair training this session. Continue to progress as tolerated with anticipated d/c home with HHPT.   Follow Up Recommendations  Home health PT     Equipment Recommendations  Rolling walker with 5" wheels    Recommendations for Other Services       Precautions / Restrictions Precautions Precautions: Anterior Hip Restrictions Weight Bearing Restrictions: Yes RLE Weight Bearing: Weight bearing as tolerated    Mobility  Bed Mobility               General bed mobility comments: pt OOB in chair upon arrival  Transfers Overall transfer level: Needs assistance Equipment used: Rolling walker (2 wheeled) Transfers: Sit to/from Stand Sit to Stand: Supervision         General transfer comment: from recliner and BSC; safe hand placement and technique  Ambulation/Gait Ambulation/Gait assistance: Supervision Ambulation Distance (Feet): 120 Feet Assistive device: Rolling walker (2 wheeled) Gait Pattern/deviations: Step-through pattern;Decreased stance time - right;Decreased step length - left;Decreased stride length;Decreased weight shift to right     General Gait Details: pt with improved step through pattern and step length symmetry with increased distance; cues for posture   Stairs Stairs: Yes Stairs assistance: Min assist Stair Management: No rails;Backwards;With walker Number of Stairs: 2 General stair comments: cues for sequencing and technique; assist to steady RW  Wheelchair Mobility    Modified Rankin (Stroke Patients Only)       Balance                                     Cognition Arousal/Alertness: Awake/alert Behavior During Therapy: Flat affect Overall Cognitive Status: Within Functional Limits for tasks assessed                      Exercises      General Comments        Pertinent Vitals/Pain Pain Assessment: Faces Faces Pain Scale: Hurts little more Pain Location: R hip Pain Descriptors / Indicators: Guarding;Sore Pain Intervention(s): Limited activity within patient's tolerance;Monitored during session;Repositioned    Home Living                      Prior Function            PT Goals (current goals can now be found in the care plan section) Acute Rehab PT Goals Patient Stated Goal: feel better Progress towards PT goals: Progressing toward goals    Frequency    7X/week      PT Plan Current plan remains appropriate    Co-evaluation             End of Session Equipment Utilized During Treatment: Gait belt Activity Tolerance: Patient tolerated treatment well Patient left: with call bell/phone within reach;in chair     Time: [id number] PT Time Calculation (min) (ACUTE ONLY): 23 min  Charges:  $Gait Training: 8-22 mins $Therapeutic Activity: 8-22 mins  G Codes:      [place] April, PTA Pager: [phone]   09/04/2016, 9:17 AM

## 2016-09-04 NOTE — Progress Notes (Signed)
Patient ID: Carol Crosby, female   DOB: 1961/02/01, 55 y.o.   MRN: [id number] Making slow progress.  Still some nausea, but feels better than the day before.  Would like to go home this afternoon.  Vitals and right hip stable.

## 2016-09-04 NOTE — Progress Notes (Signed)
Physical Therapy Treatment Patient Details Name: Carol Crosby MRN: [id number] DOB: 1961/07/21 Today's Date: 09/04/2016    History of Present Illness Pt is a 55 yo female admitted for R anterior THR and R wrist aspiration and steroid injection for ganglion cyst.  Pt had L anterior total hip in June.      PT Comments    Patient continues to progress toward mobility goals. Current plan remains appropriate.   Follow Up Recommendations  Home health PT     Equipment Recommendations  Rolling walker with 5" wheels    Recommendations for Other Services       Precautions / Restrictions Precautions Precautions: Anterior Hip Restrictions Weight Bearing Restrictions: Yes RLE Weight Bearing: Weight bearing as tolerated    Mobility  Bed Mobility Overal bed mobility: Modified Independent Bed Mobility: Supine to Sit           General bed mobility comments: increased time and effort; min use of rails and HOB elevated  Transfers Overall transfer level: Needs assistance Equipment used: Rolling walker (2 wheeled) Transfers: Sit to/from Stand Sit to Stand: Supervision         General transfer comment: supervision for safety  Ambulation/Gait Ambulation/Gait assistance: Supervision Ambulation Distance (Feet): 120 Feet Assistive device: Rolling walker (2 wheeled) Gait Pattern/deviations: Step-through pattern;Decreased stride length;Antalgic     General Gait Details: pt with improved step through and ability to WB on R LE; cues for posture   Stairs            Wheelchair Mobility    Modified Rankin (Stroke Patients Only)       Balance                                    Cognition Arousal/Alertness: Awake/alert Behavior During Therapy: Flat affect Overall Cognitive Status: Within Functional Limits for tasks assessed                      Exercises Total Joint Exercises Heel Slides: AAROM;Right;10 reps;Seated Hip ABduction/ADduction:  AROM;Right;10 reps;Standing Long Arc Quad: AROM;Right;10 reps;Seated Knee Flexion: AROM;Right;10 reps;Standing    General Comments        Pertinent Vitals/Pain Pain Assessment: Faces Faces Pain Scale: Hurts little more Pain Location: R hip Pain Descriptors / Indicators: Sore Pain Intervention(s): Limited activity within patient's tolerance;Monitored during session;Premedicated before session;Repositioned    Home Living                      Prior Function            PT Goals (current goals can now be found in the care plan section) Acute Rehab PT Goals Patient Stated Goal: go home Progress towards PT goals: Progressing toward goals    Frequency    7X/week      PT Plan Current plan remains appropriate    Co-evaluation             End of Session Equipment Utilized During Treatment: Gait belt Activity Tolerance: Patient tolerated treatment well Patient left: with call bell/phone within reach;in chair     Time: 1325-1346 PT Time Calculation (min) (ACUTE ONLY): 21 min  Charges:  $Gait Training: 8-22 mins                    G Codes:      Carol Crosby, PTA Pager: [phone]  09/04/2016, 1:51 PM

## 2016-09-04 NOTE — Progress Notes (Signed)
Discharge instructions and Prescriptions reviewed with patient. Patient stated understanding. IV removed. Patient is waiting for her daughter to come and get her to take her home Eilan Mcinerny A Archivist, RN

## 2016-09-04 NOTE — Progress Notes (Signed)
Occupational Therapy Treatment Patient Details Name: Carol Crosby MRN: [id number] DOB: 1960-12-05 Today's Date: 09/04/2016    History of present illness Pt is a 55 yo female admitted for R anterior THR and R wrist aspiration and steroid injection for ganglion cyst.  Pt had L anterior total hip in June.     OT comments  Pt making progress towards goals. This session focused on dressing and tub transfer. Pt at adequate level of safety and comprehension of compensatory strategies and safety for discharge home. Pt with no further questions or concerns involving ADL.   Follow Up Recommendations  No OT follow up;Supervision - Intermittent    Equipment Recommendations  3 in 1 bedside comode    Recommendations for Other Services      Precautions / Restrictions Precautions Precautions: Anterior Hip Restrictions Weight Bearing Restrictions: Yes RLE Weight Bearing: Weight bearing as tolerated       Mobility Bed Mobility Overal bed mobility: Modified Independent Bed Mobility: Supine to Sit           General bed mobility comments: Pt sitting OOB in recliner upon OT entering the room  Transfers Overall transfer level: Needs assistance Equipment used: Rolling walker (2 wheeled) Transfers: Sit to/from Stand Sit to Stand: Supervision         General transfer comment: supervision for safety    Balance Overall balance assessment: Needs assistance Sitting-balance support: No upper extremity supported;Feet supported Sitting balance-Leahy Scale: Good     Standing balance support: No upper extremity supported;During functional activity Standing balance-Leahy Scale: Fair Standing balance comment: able to pull up clothing with no support from RW or BUE                   ADL Overall ADL's : Needs assistance/impaired                 Upper Body Dressing : Set up;Sitting Upper Body Dressing Details (indicate cue type and reason): donned bra and shirt Lower Body  Dressing: Moderate assistance;Sit to/from stand Lower Body Dressing Details (indicate cue type and reason): assist with underwear and pants to knees, total assist for socks and shoes. Pt shared that her 54 year old daughter helps her with that at baseline Toilet Transfer: Min guard;RW;Comfort height Glass blower/designer Details (indicate cue type and reason): simulated with recliner     Tub/ Shower Transfer: Tub transfer;Tub bench;Min guard Tub/Shower Transfer Details (indicate cue type and reason): Pt was using tub bench at baseline. No questions or concerns from Pt Functional mobility during ADLs: Min guard;Rolling walker;Cueing for sequencing General ADL Comments: Pt continuing to make progress towards goals and maximizing independence      Vision                     Perception     Praxis      Cognition   Behavior During Therapy: [facility] for tasks assessed/performed Overall Cognitive Status: Within Functional Limits for tasks assessed                       Extremity/Trunk Assessment               Exercises Total Joint Exercises Heel Slides: AAROM;Right;10 reps;Seated Hip ABduction/ADduction: AROM;Right;10 reps;Standing Long Arc Quad: AROM;Right;10 reps;Seated Knee Flexion: AROM;Right;10 reps;Standing   Shoulder Instructions       General Comments      Pertinent Vitals/ Pain       Pain Assessment: Faces  Faces Pain Scale: Hurts little more Pain Location: R hip Pain Descriptors / Indicators: Grimacing;Sore Pain Intervention(s): Limited activity within patient's tolerance;Monitored during session;Premedicated before session  Home Living                                          Prior Functioning/Environment              Frequency  Min 2X/week        Progress Toward Goals  OT Goals(current goals can now be found in the care plan section)  Progress towards OT goals: Progressing toward goals  Acute Rehab OT  Goals Patient Stated Goal: go home OT Goal Formulation: With patient Time For Goal Achievement: 09/09/16 Potential to Achieve Goals: Good  Plan Discharge plan remains appropriate    Co-evaluation                 End of Session Equipment Utilized During Treatment: Rolling walker   Activity Tolerance Patient tolerated treatment well   Patient Left in chair;with call bell/phone within reach   Nurse Communication Other (comment) (going in to discuss discharge)        Time: [id number] OT Time Calculation (min): 16 min  Charges: OT General Charges $OT Visit: 1 Procedure OT Treatments $Self Care/Home Management : 8-22 mins  Carol Crosby 09/04/2016, 3:25 PM Carol Crosby OTR/L [phone]

## 2016-09-04 NOTE — Discharge Summary (Signed)
Patient ID: Carol Crosby MRN: [id number] DOB/AGE: 04-15-1961 55 y.o.  Admit date: 09/01/2016 Discharge date: 09/04/2016  Admission Diagnoses:  Principal Problem:   Unilateral primary osteoarthritis, right hip Active Problems:   Status post total replacement of right hip   Discharge Diagnoses:  Same  Past Medical History:  Diagnosis Date  . Abdominal pain, right upper quadrant   . Acute sinusitis, unspecified   . Arthritis    knees, back, hip   . Esophageal reflux   . Excessive or frequent menstruation   . History of bronchitis   . Hypersomnia, unspecified   . Hypertension   . Mixed incontinence urge and stress (female)(female)   . Morbid obesity ([place])   . Obstructive sleep apnea (adult) (pediatric)   . Other chest pain   . Palpitations   . Scarlet fever   . Streptococcal sore throat   . Temporomandibular joint disorders, unspecified   . Unspecified urinary incontinence     Surgeries: Procedure(s): RIGHT TOTAL HIP ARTHROPLASTY ANTERIOR APPROACH and RIGHT WRIST ASPIRATION AND STEROID INJECTION on 09/01/2016   Consultants:   Discharged Condition: Improved  Hospital Course: Carol Crosby is an 55 y.o. female who was admitted 09/01/2016 for operative treatment ofUnilateral primary osteoarthritis, right hip. Patient has severe unremitting pain that affects sleep, daily activities, and work/hobbies. After pre-op clearance the patient was taken to the operating room on 09/01/2016 and underwent  Procedure(s): RIGHT TOTAL HIP ARTHROPLASTY ANTERIOR APPROACH and RIGHT WRIST ASPIRATION AND STEROID INJECTION.    Patient was given perioperative antibiotics: Anti-infectives    Start     Dose/Rate Route Frequency Ordered Stop   09/01/16 2100  ceFAZolin (ANCEF) IVPB 2g/100 mL premix     2 g 200 mL/hr over 30 Minutes Intravenous Every 6 hours 09/01/16 2046 09/02/16 0342   09/01/16 1300  ceFAZolin (ANCEF) IVPB 2g/100 mL premix     2 g 200 mL/hr over 30 Minutes Intravenous To ShortStay  Surgical 08/31/16 1121 09/01/16 1411       Patient was given sequential compression devices, early ambulation, and chemoprophylaxis to prevent DVT.  Patient benefited maximally from hospital stay and there were no complications.    Recent vital signs: Patient Vitals for the past 24 hrs:  BP Temp Temp src Pulse Resp SpO2  09/04/16 0604 (!) 127/59 97.9 F (36.6 C) Oral 90 18 92 %  09/03/16 2124 (!) 105/56 98.3 F (36.8 C) Oral 96 16 95 %  09/03/16 1404 105/63 98.6 F (37 C) Oral (!) 106 16 (!) 88 %     Recent laboratory studies:  Recent Labs  09/02/16 0739 09/03/16 0534  WBC 14.2* 14.4*  HGB 11.6* 11.1*  HCT 36.5 35.5*  PLT 252 246  NA 138  --   K 4.5  --   CL 104  --   CO2 28  --   BUN 11  --   CREATININE 0.62  --   GLUCOSE 125*  --   CALCIUM 8.5*  --      Discharge Medications:     Medication List    TAKE these medications   acetaminophen 500 MG tablet Commonly known as:  TYLENOL Take 500 mg by mouth every 6 (six) hours as needed for mild pain.   aspirin 325 MG EC tablet Take 1 tablet (325 mg total) by mouth 2 (two) times daily after a meal.   celecoxib 200 MG capsule Commonly known as:  CELEBREX Take 1 capsule by mouth daily.   HYDROcodone-acetaminophen 5-325 MG  tablet Commonly known as:  NORCO/VICODIN Take 1 tablet by mouth. Takes mainly at bedtime   losartan-hydrochlorothiazide 50-12.5 MG tablet Commonly known as:  HYZAAR Take 1 tablet by mouth daily.   methocarbamol 500 MG tablet Commonly known as:  ROBAXIN Take 1 tablet (500 mg total) by mouth every 6 (six) hours as needed for muscle spasms.   ondansetron 4 MG disintegrating tablet Commonly known as:  ZOFRAN ODT Take 1 tablet (4 mg total) by mouth every 8 (eight) hours as needed for nausea or vomiting.   oxyCODONE-acetaminophen 5-325 MG tablet Commonly known as:  ROXICET Take 1-2 tablets by mouth every 4 (four) hours as needed. What changed:  Another medication with the same name was  added. Make sure you understand how and when to take each.   oxyCODONE-acetaminophen 5-325 MG tablet Commonly known as:  ROXICET Take 1-2 tablets by mouth every 4 (four) hours as needed. What changed:  You were already taking a medication with the same name, and this prescription was added. Make sure you understand how and when to take each.   traMADol 50 MG tablet Commonly known as:  ULTRAM Take 1 tablet by mouth daily.   traZODone 50 MG tablet Commonly known as:  DESYREL TAKE ONE TABLET BY MOUTH AT BEDTIME            Durable Medical Equipment        Start     Ordered   09/01/16 2047  DME Walker rolling  Once     09/01/16 2046   09/01/16 2047  DME 3 n 1  Once     09/01/16 2046      Diagnostic Studies: Dg C-arm 1-60 Min  Result Date: 09/01/2016 CLINICAL DATA:  Right anterior hip replacement. EXAM: OPERATIVE right HIP (WITH PELVIS IF PERFORMED) 3 VIEWS TECHNIQUE: Fluoroscopic spot image(s) were submitted for interpretation post-operatively. COMPARISON:  Left hip and pelvis radiographs 04/21/2016 FINDINGS: Intraoperative views demonstrate a right total hip arthroplasty. No fractures are present. Previous left hip arthroplasty is demonstrated. IMPRESSION: Right hip arthroplasty without radiographic evidence for complication. Electronically Signed   By: San Morelle M.D.   On: 09/01/2016 16:35   Dg Hip Port Unilat With Pelvis 1v Right  Result Date: 09/01/2016 CLINICAL DATA:  Post right anterior approach total hip replacement EXAM: DG HIP (WITH OR WITHOUT PELVIS) 1V PORT RIGHT COMPARISON:  Intraoperative radiographs of the right hip - earlier same day FINDINGS: Post right total hip replacement. Alignment appears anatomic. No evidence of hardware failure or loosening. No fracture or dislocation. Dermal/muscle calcifications are noted about the posterior lateral aspect of the proximal aspect the right thigh. Scattered foci of subcutaneous emphysema about the operative site. No  radiopaque foreign body. Overlying skin staples. Post left total hip replacement, incompletely evaluated. Several phleboliths overlie the lower pelvis. IMPRESSION: Post right total hip replacement without evidence of complication. Electronically Signed   By: Sandi Mariscal M.D.   On: 09/01/2016 16:40   Dg Hip Operative Unilat With Pelvis Right  Result Date: 09/01/2016 CLINICAL DATA:  Right anterior hip replacement. EXAM: OPERATIVE right HIP (WITH PELVIS IF PERFORMED) 3 VIEWS TECHNIQUE: Fluoroscopic spot image(s) were submitted for interpretation post-operatively. COMPARISON:  Left hip and pelvis radiographs 04/21/2016 FINDINGS: Intraoperative views demonstrate a right total hip arthroplasty. No fractures are present. Previous left hip arthroplasty is demonstrated. IMPRESSION: Right hip arthroplasty without radiographic evidence for complication. Electronically Signed   By: San Morelle M.D.   On: 09/01/2016 16:35    Disposition:  06-Home-Health Care Svc  Discharge Instructions    Call MD / Call 911    Complete by:  As directed    If you experience chest pain or shortness of breath, CALL 911 and be transported to the hospital emergency room.  If you develope a fever above 101 F, pus (white drainage) or increased drainage or redness at the wound, or calf pain, call your surgeon's office.   Constipation Prevention    Complete by:  As directed    Drink plenty of fluids.  Prune juice may be helpful.  You may use a stool softener, such as Colace (over the counter) 100 mg twice a day.  Use MiraLax (over the counter) for constipation as needed.   Diet - low sodium heart healthy    Complete by:  As directed    Discharge patient    Complete by:  As directed    Increase activity slowly as tolerated    Complete by:  As directed       Follow-up Information    [facility] Follow up.   Specialty:  [place] Why:  Someone from [facility] will contact you to arrange start date and  time for therapy. Contact information: [place] [address] [phone]        Mcarthur Rossetti, MD Follow up in 2 week(s).   Specialty:  Orthopedic Surgery Contact information: [address] [place] [address] [phone]            Signed: Mcarthur Rossetti 09/04/2016, 6:56 AM

## 2016-09-15 ENCOUNTER — Encounter (INDEPENDENT_AMBULATORY_CARE_PROVIDER_SITE_OTHER): Payer: Self-pay | Admitting: Orthopaedic Surgery

## 2016-09-15 ENCOUNTER — Ambulatory Visit (INDEPENDENT_AMBULATORY_CARE_PROVIDER_SITE_OTHER): Payer: BC Managed Care – PPO | Admitting: Physician Assistant

## 2016-09-15 ENCOUNTER — Telehealth (INDEPENDENT_AMBULATORY_CARE_PROVIDER_SITE_OTHER): Payer: Self-pay | Admitting: Orthopaedic Surgery

## 2016-09-15 DIAGNOSIS — Z96641 Presence of right artificial hip joint: Secondary | ICD-10-CM

## 2016-09-15 NOTE — Telephone Encounter (Signed)
LMOM for patient that I completed them and faxed them 08/28/16.

## 2016-09-15 NOTE — Telephone Encounter (Signed)
Pt checking on FMLA forms she sent in to filled out back in October prior to her having surgery. Please call [phone]. (nothing in [facility])

## 2016-09-15 NOTE — Progress Notes (Signed)
Office Visit Note   Patient: Carol Crosby           Date of Birth: October 30, 1960           MRN: [id number] Visit Date: 09/15/2016              Requested by: Carol Sanders, MD [place],  [id number] PCP: Carol Lofts, MD   Assessment & Plan: Visit Diagnoses:  1. H/O total hip arthroplasty, right     Plan: Work on gait and balance strengthening of the right hip. Scar tissue mobilization encouraged. He was removed today Steri-Strips applied. She'll discontinue her aspirin in 1 week  Follow-Up Instructions: Return in about 4 weeks (around 10/13/2016) for wound check.   Orders:  No orders of the defined types were placed in this encounter.  No orders of the defined types were placed in this encounter.     Procedures: No procedures performed   Clinical Data: No additional findings.   Subjective: Chief Complaint  Patient presents with  . Right Hip - Routine Post Op    HPI  Carol Crosby returns 2 weeks status post right total hip arthroplasty. He states his hip has been a little tougher than the left but overall she's doing well. No chest pain shortness breath or calf pain. Right wrist ganglion cyst is present but nonpainful  Review of Systems See history of present illness  Objective: Vital Signs: There were no vitals taken for this visit.  Physical Exam  Constitutional: She appears well-developed and well-nourished. No distress.  Neurological: She is alert.  Psychiatric: She has a normal mood and affect.    Ortho Exam Right hip incision healing well well approximated with staples no signs of infection. Good range of motion of the right hip she is ambulating with a walker. Calf supple nontender Specialty Comments:  No specialty comments available.  Imaging: No results found.   PMFS History: Patient Active Problem List   Diagnosis Date Noted  . 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 Unilateral primary osteoarthritis,  right hip 04/21/2016  . 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 Viral URI with cough 12/31/2015  . Chronic insomnia 12/31/2015  . Acute sinusitis 11/20/2015  . Lipoma 12/10/2014  . Right shoulder pain 12/10/2014  . Severe headache 01/23/2014  . HTN (hypertension), benign 01/09/2014  . Right knee pain 01/09/2014  . Throat irritation 01/09/2014  . OBSTRUCTIVE SLEEP APNEA 01/14/2009  . Morbid obesity ([place]) 11/30/2007  . INCONTINENCE, MIXED, URGE/STRESS 11/30/2007   Past Medical History:  Diagnosis Date  . Abdominal pain, right upper quadrant   . Acute sinusitis, unspecified   . Arthritis    knees, back, hip   . Esophageal reflux   . Excessive or frequent menstruation   . History of bronchitis   . Hypersomnia, unspecified   . Hypertension   . Mixed incontinence urge and stress (female)(female)   . Morbid obesity ([place])   . Obstructive sleep apnea (adult) (pediatric)   . Other chest pain   . Palpitations   . Scarlet fever   . Streptococcal sore throat   . Temporomandibular joint disorders, unspecified   . Unspecified urinary incontinence     Family History  Problem Relation Age of Onset  . Diabetes Father   . Migraines Mother   . Hypertension Mother   . Fibromyalgia Sister   . Colon cancer Maternal Grandmother   . Other Brother     ?  Heart issue    Past Surgical History:  Procedure Laterality Date  . CESAREAN SECTION  2005  . CHOLECYSTECTOMY    . EXTREMITY CYST EXCISION  2000   Wrist  . EYE SURGERY     lasik- both eyes   . FRACTURE SURGERY     R ankle- screw for fixation   . KNEE ARTHROSCOPY Left 2007  . [place]  . [place]  . TONSILLECTOMY    . TOTAL HIP ARTHROPLASTY Left 04/21/2016   Procedure: LEFT TOTAL HIP ARTHROPLASTY ANTERIOR APPROACH;  Surgeon: Mcarthur Rossetti, MD;  Location: [place];  Service: Orthopedics;  Laterality: Left;  . TOTAL HIP ARTHROPLASTY Right 09/01/2016   Procedure: RIGHT  TOTAL HIP ARTHROPLASTY ANTERIOR APPROACH and RIGHT WRIST ASPIRATION AND STEROID INJECTION;  Surgeon: Mcarthur Rossetti, MD;  Location: [place];  Service: Orthopedics;  Laterality: Right;  . TUBAL LIGATION     Social History   Occupational History  . [place] History Main Topics  . Smoking status: Former Smoker    Quit date: 10/26/2001  . Smokeless tobacco: Never Used  . Alcohol use No  . Drug use: No  . Sexual activity: Not on file

## 2016-09-24 ENCOUNTER — Other Ambulatory Visit: Payer: Self-pay | Admitting: Family Medicine

## 2016-09-24 ENCOUNTER — Other Ambulatory Visit (INDEPENDENT_AMBULATORY_CARE_PROVIDER_SITE_OTHER): Payer: Self-pay | Admitting: Physician Assistant

## 2016-09-25 NOTE — Telephone Encounter (Signed)
Spoke with Carol Crosby.  She states her blood pressure seems fine.  She states when she was in the hospital for her hip replacement in November, her blood pressure was good.  I reviewed blood pressure reading from the hospital and they were all in normal range.

## 2016-09-25 NOTE — Telephone Encounter (Signed)
Please advise 

## 2016-09-25 NOTE — Telephone Encounter (Signed)
Okay to refill but call and ask pt how BPs have been running.

## 2016-09-25 NOTE — Telephone Encounter (Signed)
Last office visit 01/24/2016.  AVS states to return in 2 weeks to f/u HTN. No future appointments.  Refill?

## 2016-10-14 ENCOUNTER — Ambulatory Visit (INDEPENDENT_AMBULATORY_CARE_PROVIDER_SITE_OTHER): Payer: BC Managed Care – PPO | Admitting: Physician Assistant

## 2016-10-14 ENCOUNTER — Ambulatory Visit (INDEPENDENT_AMBULATORY_CARE_PROVIDER_SITE_OTHER): Payer: BC Managed Care – PPO

## 2016-10-14 DIAGNOSIS — Z96641 Presence of right artificial hip joint: Secondary | ICD-10-CM

## 2016-10-14 NOTE — Progress Notes (Signed)
Office Visit Note   Patient: Carol Crosby           Date of Birth: [birth year]           MRN: [id number] Visit Date: 10/14/2016              Requested by: Jinny Sanders, MD [place], [place] [address] PCP: Eliezer Lofts, MD   Assessment & Plan: Visit Diagnoses:  1. H/O total hip arthroplasty, right     Plan:She will work on IT band stretching, gait and balance. Month check progress /lack of. To return to work 10/27/2016 with no restrictions full duties.  Follow-Up Instructions: Return in about 4 weeks (around 11/11/2016).   Orders:  Orders Placed This Encounter  Procedures  . XR HIP UNILAT W OR W/O PELVIS 2-3 VIEWS RIGHT   No orders of the defined types were placed in this encounter.     Procedures: No procedures performed   Clinical Data: No additional findings.   Subjective: No chief complaint on file.   HPI Carol Crosby turns today stating that overall she is doing well at his pain lateral aspect of the hip and some stiffness she's still ambulate with cane. However she does feel that her range of motion of the hip has improved. No complaints otherwise Review of Systems   Objective: Vital Signs: There were no vitals taken for this visit.  Physical Exam  Constitutional: She is oriented to person, place, and time. She appears well-developed and well-nourished. No distress.  Pulmonary/Chest: Effort normal.  Neurological: She is alert and oriented to person, place, and time.    Ortho Exam Right hip good fluid range of motion without pain she has tenderness over the trochanteric region. Right calf supple nontender. Specialty Comments:  No specialty comments available.  Imaging: No results found.   PMFS History: Patient Active Problem List   Diagnosis Date Noted  . 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 Unilateral primary osteoarthritis, right hip 04/21/2016  . 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  Viral URI with cough 12/31/2015  . Chronic insomnia 12/31/2015  . Acute sinusitis 11/20/2015  . Lipoma 12/10/2014  . Right shoulder pain 12/10/2014  . Severe headache 01/23/2014  . HTN (hypertension), benign 01/09/2014  . Right knee pain 01/09/2014  . Throat irritation 01/09/2014  . OBSTRUCTIVE SLEEP APNEA 01/14/2009  . Morbid obesity ([place]) 11/30/2007  . INCONTINENCE, MIXED, URGE/STRESS 11/30/2007   Past Medical History:  Diagnosis Date  . Abdominal pain, right upper quadrant   . Acute sinusitis, unspecified   . Arthritis    knees, back, hip   . Esophageal reflux   . Excessive or frequent menstruation   . History of bronchitis   . Hypersomnia, unspecified   . Hypertension   . Mixed incontinence urge and stress (female)(female)   . Morbid obesity ([place])   . Obstructive sleep apnea (adult) (pediatric)   . Other chest pain   . Palpitations   . Scarlet fever   . Streptococcal sore throat   . Temporomandibular joint disorders, unspecified   . Unspecified urinary incontinence     Family History  Problem Relation Age of Onset  . Diabetes Father   . Migraines Mother   . Hypertension Mother   . Fibromyalgia Sister   . Colon cancer Maternal Grandmother   . Other Brother     ? Heart issue    Past Surgical History:  Procedure Laterality  Date  . CESAREAN SECTION  2005  . CHOLECYSTECTOMY    . EXTREMITY CYST EXCISION  2000   Wrist  . EYE SURGERY     lasik- both eyes   . FRACTURE SURGERY     R ankle- screw for fixation   . KNEE ARTHROSCOPY Left 2007  . [place]  . [place]  . TONSILLECTOMY    . TOTAL HIP ARTHROPLASTY Left 04/21/2016   Procedure: LEFT TOTAL HIP ARTHROPLASTY ANTERIOR APPROACH;  Surgeon: Mcarthur Rossetti, MD;  Location: [facility];  Service: Orthopedics;  Laterality: Left;  . TOTAL HIP ARTHROPLASTY Right 09/01/2016   Procedure: RIGHT TOTAL HIP ARTHROPLASTY ANTERIOR APPROACH and RIGHT WRIST ASPIRATION AND STEROID  INJECTION;  Surgeon: Mcarthur Rossetti, MD;  Location: [facility];  Service: Orthopedics;  Laterality: Right;  . TUBAL LIGATION     Social History   Occupational History  . [place] History Main Topics  . Smoking status: Former Smoker    Quit date: 10/26/2001  . Smokeless tobacco: Never Used  . Alcohol use No  . Drug use: No  . Sexual activity: Not on file      AAAAAAAAAAAAAAAAAAAAAAAAAAAAAAAAAAAAAAAAAAAAAAAAAAAAAAAAAAAAAAAAAAAAAAAAAAAAAAAAAAAAAAAAAAAAAAAAAAAAAAAA  ++++++++++++++++++++

## 2016-11-07 ENCOUNTER — Other Ambulatory Visit: Payer: Self-pay | Admitting: Family Medicine

## 2016-11-11 ENCOUNTER — Ambulatory Visit (INDEPENDENT_AMBULATORY_CARE_PROVIDER_SITE_OTHER): Payer: BC Managed Care – PPO | Admitting: Orthopaedic Surgery

## 2016-12-02 ENCOUNTER — Ambulatory Visit (INDEPENDENT_AMBULATORY_CARE_PROVIDER_SITE_OTHER): Payer: BC Managed Care – PPO | Admitting: Orthopaedic Surgery

## 2016-12-08 ENCOUNTER — Other Ambulatory Visit (INDEPENDENT_AMBULATORY_CARE_PROVIDER_SITE_OTHER): Payer: Self-pay | Admitting: Physician Assistant

## 2016-12-09 NOTE — Telephone Encounter (Signed)
Please advise 

## 2016-12-16 ENCOUNTER — Ambulatory Visit (INDEPENDENT_AMBULATORY_CARE_PROVIDER_SITE_OTHER): Payer: BC Managed Care – PPO | Admitting: Orthopaedic Surgery

## 2016-12-16 DIAGNOSIS — Z96642 Presence of left artificial hip joint: Secondary | ICD-10-CM | POA: Diagnosis not present

## 2016-12-16 DIAGNOSIS — Z96641 Presence of right artificial hip joint: Secondary | ICD-10-CM

## 2016-12-16 NOTE — Progress Notes (Signed)
Mrs. Bellina is now 8 months out from a left hip replacement and over 3 months out from a right hip replacement. She says she is doing well. She has minimal pain and is not walking with any limp or an assistive device.  Both hips have good internal rotation rotation as well as flexion extension with minimal discomfort. She has some slight pain over the right trochanteric area only. We did not x-ray anything today.  She'll continue increase her activities as he tolerates. We'll see her back at the one-year standpoint from her right hip surgery. I'll like just a low AP pelvis at that visit.

## 2016-12-17 ENCOUNTER — Telehealth (INDEPENDENT_AMBULATORY_CARE_PROVIDER_SITE_OTHER): Payer: Self-pay | Admitting: Orthopaedic Surgery

## 2016-12-17 NOTE — Telephone Encounter (Signed)
Patient called needing a permanent handicap placard. CB # [phone]

## 2016-12-17 NOTE — Telephone Encounter (Signed)
Ok to give one

## 2016-12-18 NOTE — Telephone Encounter (Signed)
Patient asked if I could mail this to her

## 2016-12-24 ENCOUNTER — Telehealth (INDEPENDENT_AMBULATORY_CARE_PROVIDER_SITE_OTHER): Payer: Self-pay | Admitting: Orthopaedic Surgery

## 2016-12-24 NOTE — Telephone Encounter (Signed)
Cb#: [phone] -  Patient was checking to see if you mailed application for permanent handi cap placard.

## 2016-12-25 NOTE — Telephone Encounter (Signed)
Patient aware that this was mailed

## 2017-01-19 IMAGING — MR MR HIP*L* W/O CM
4 of 5 series · 26 of 40 positions shown · non-contrast
Comparison: Plain films left hip 07/06/2015.

CLINICAL DATA: Chronic left hip pain.  No known injury.

EXAM:
MR OF THE LEFT HIP WITHOUT CONTRAST
TECHNIQUE: Multiplanar, multisequence MR imaging was performed. No intravenous
contrast was administered.

[Series 3: T1 · coronal · 4.0mm · 0.56mm/px · 7 of 18 slices shown (1 of 2)]
[im 1/18]
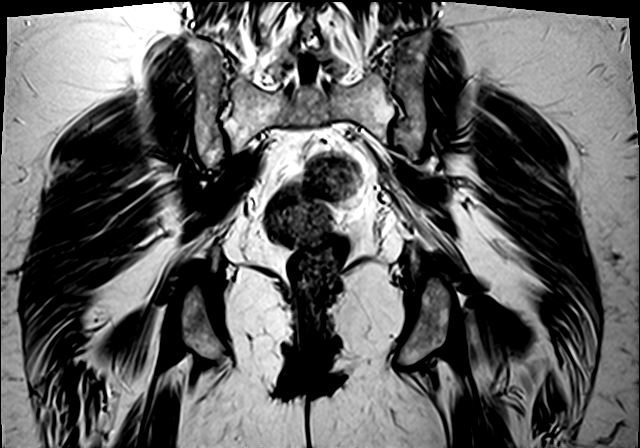
[im 3/18]
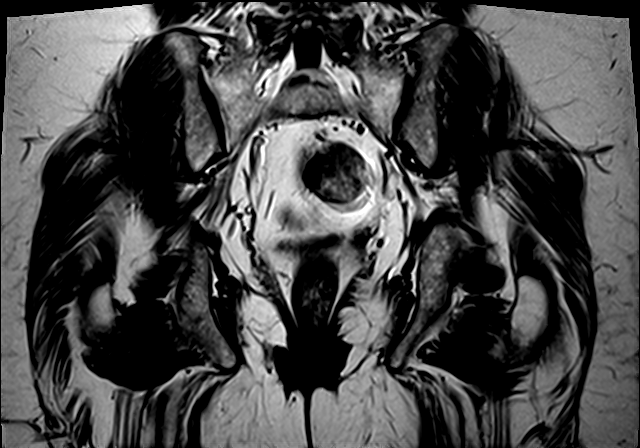
[im 5/18]
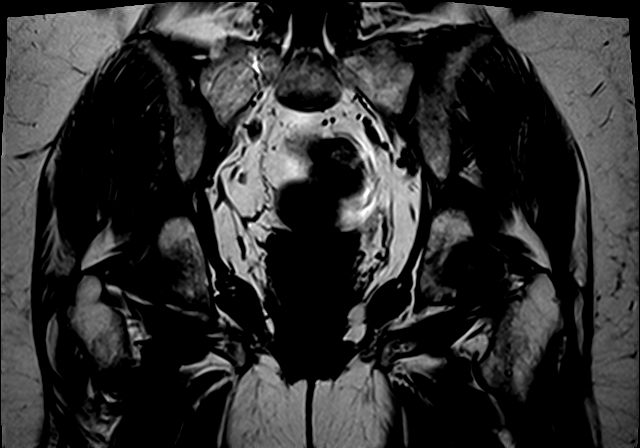
[im 8/18]
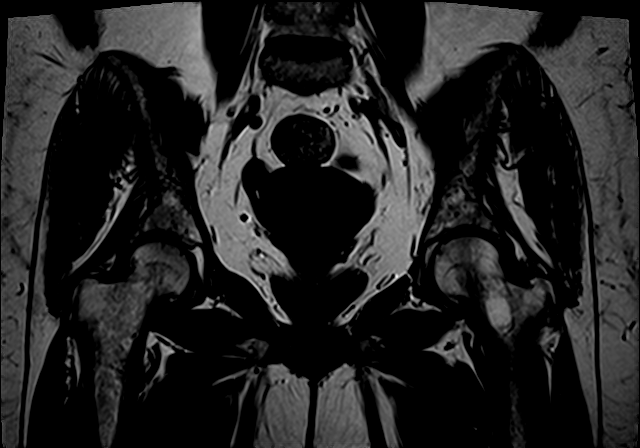
[im 10/18]
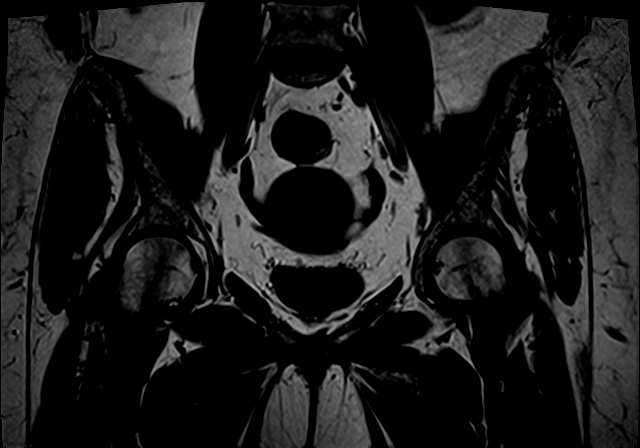
[im 13/18]
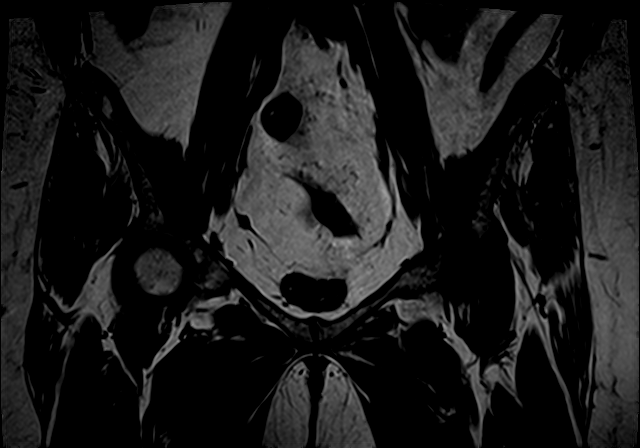
[im 15/18]
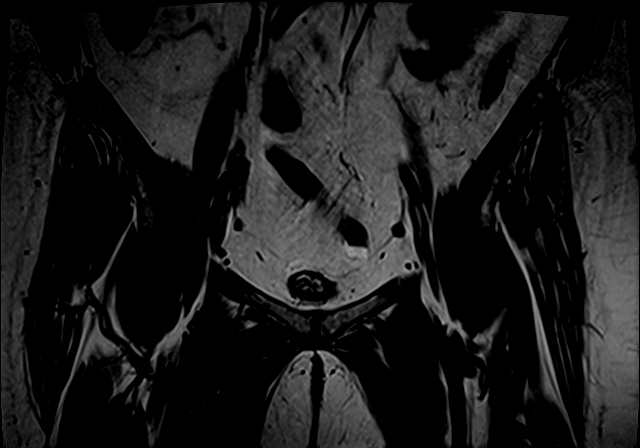

[Series 5: T2 fat-sat · axial · 4.0mm · 0.56mm/px · z∈[-15,+95]mm · 9 of 24 slices shown]
[im 1/24]
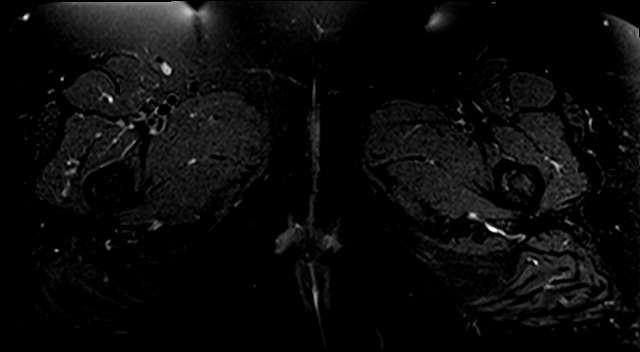
[im 3/24]
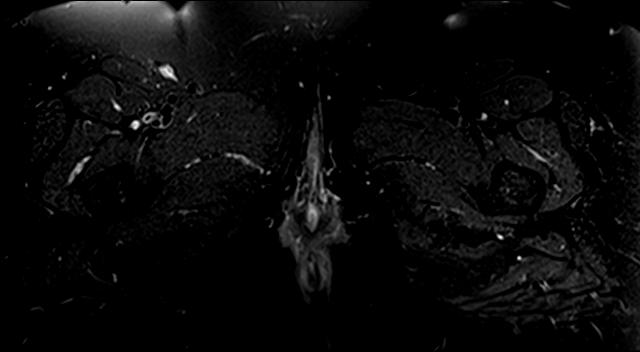
[im 6/24]
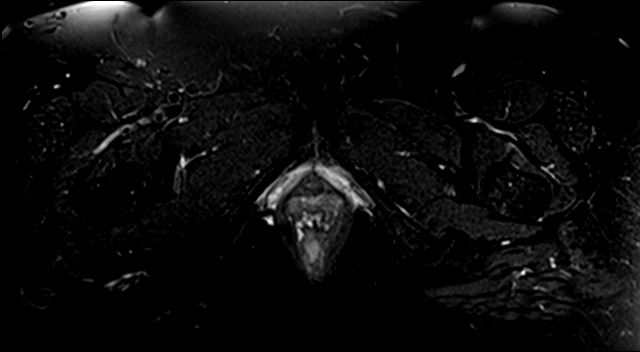
[im 9/24]
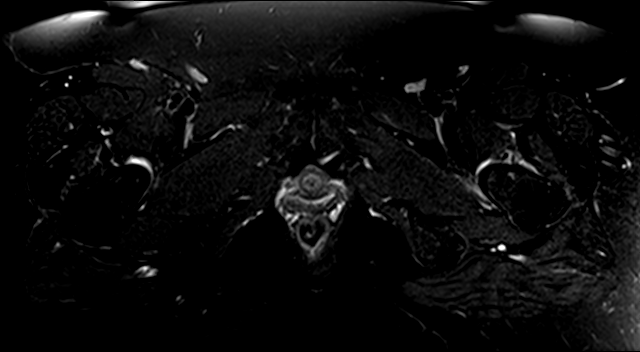
[im 12/24]
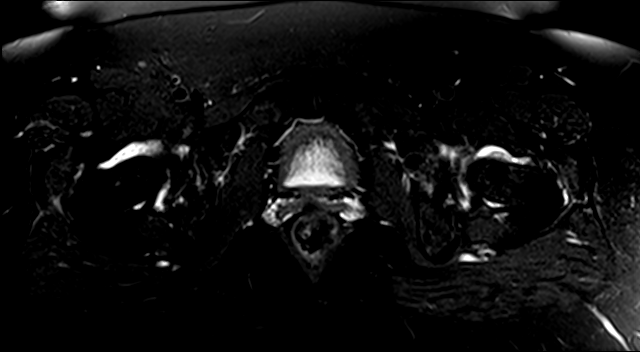
[im 15/24]
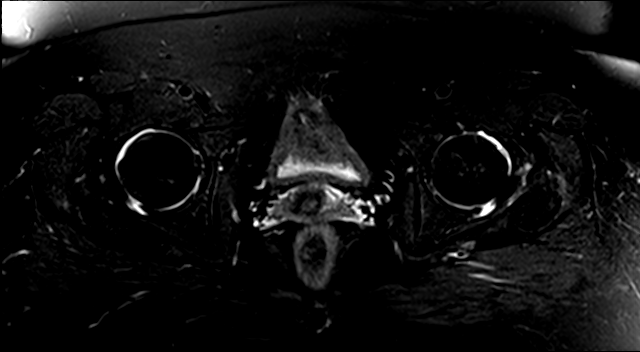
[im 18/24]
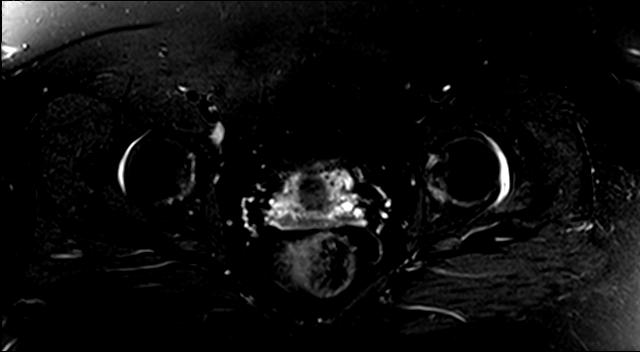
[im 21/24]
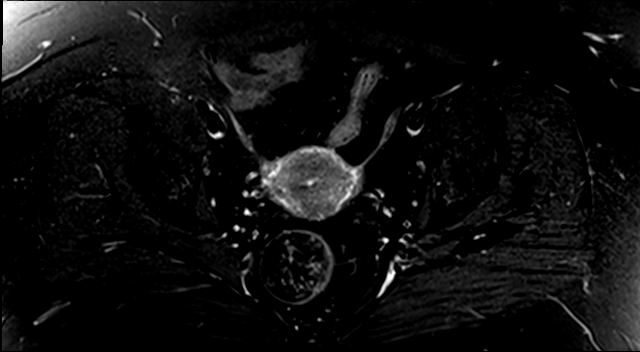
[im 24/24]
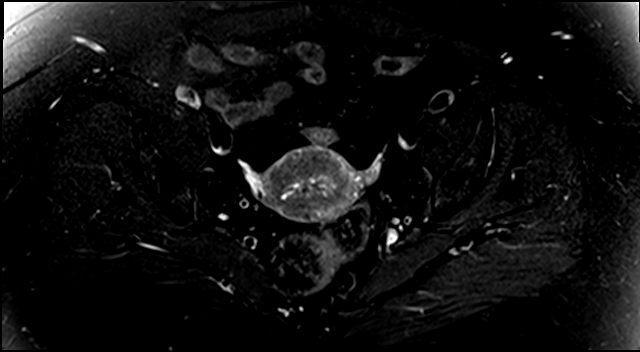

[Series 7: PD · sagittal · 4.0mm · 0.70mm/px · 7 of 19 slices shown]
[im 1/19]
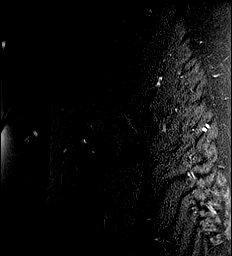
[im 4/19]
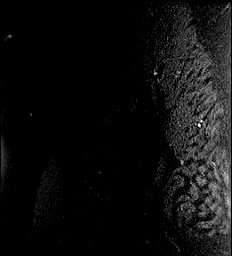
[im 7/19]
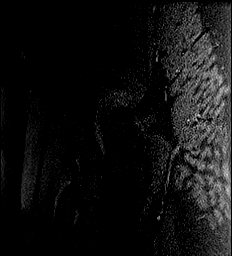
[im 10/19]
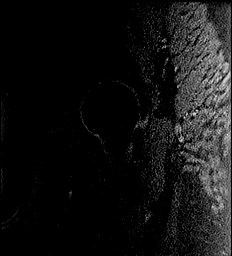
[im 13/19]
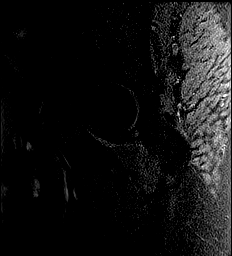
[im 16/19]
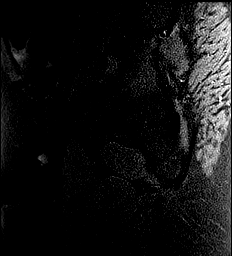
[im 19/19]
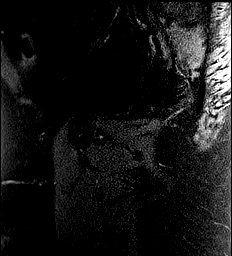

[Series 8: T1 · axial · 4.0mm · 0.56mm/px · z∈[-5,+81]mm · 3 of 24 slices shown (2 of 2)]
[im 3/24]
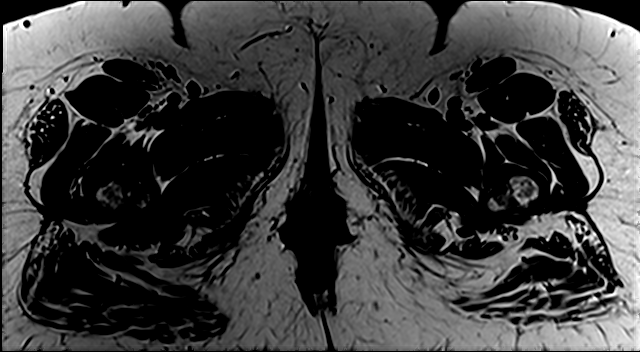
[im 12/24]
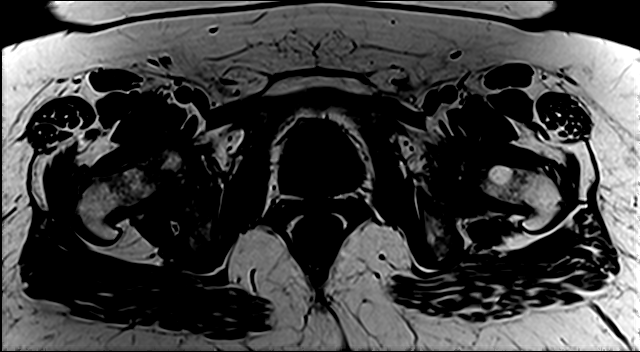
[im 21/24]
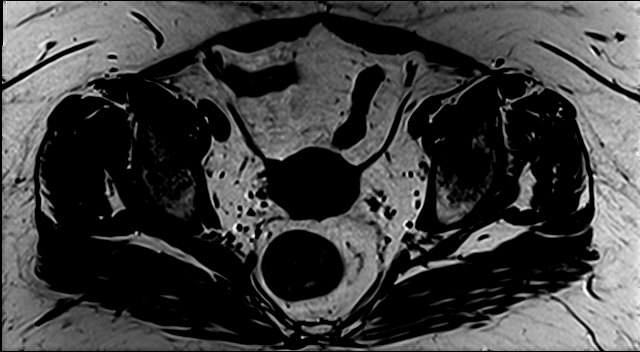

[26 of 40 positions shown; findings below may reference images not displayed]

FINDINGS: Bones: No fracture or stress change is identified about the hips.
There is no avascular necrosis of the femoral heads. No worrisome
marrow lesion is identified.

Articular cartilage and labrum

Articular cartilage:  Appears mildly thinned bilaterally.

Labrum:  No tear identified.

Joint or bursal effusion

Joint effusion:  Small joint effusions are seen bilaterally.

Bursae:  Unremarkable.

Muscles and tendons

Muscles and tendons:  Intact.

Other findings

Miscellaneous:  Imaged intrapelvic contents are unremarkable.
IMPRESSION: No acute or focal abnormality. Mild appearing degenerative change is
seen about both hips.

## 2017-04-26 ENCOUNTER — Ambulatory Visit (INDEPENDENT_AMBULATORY_CARE_PROVIDER_SITE_OTHER): Payer: BC Managed Care – PPO | Admitting: Orthopaedic Surgery

## 2017-05-07 ENCOUNTER — Other Ambulatory Visit (INDEPENDENT_AMBULATORY_CARE_PROVIDER_SITE_OTHER): Payer: Self-pay | Admitting: Orthopaedic Surgery

## 2017-05-07 NOTE — Telephone Encounter (Signed)
Please advise 

## 2017-06-10 ENCOUNTER — Other Ambulatory Visit: Payer: Self-pay | Admitting: Family Medicine

## 2017-06-10 DIAGNOSIS — Z1231 Encounter for screening mammogram for malignant neoplasm of breast: Secondary | ICD-10-CM

## 2017-06-22 ENCOUNTER — Ambulatory Visit: Payer: BC Managed Care – PPO

## 2017-07-07 ENCOUNTER — Ambulatory Visit
Admission: RE | Admit: 2017-07-07 | Discharge: 2017-07-07 | Disposition: A | Discharge status: Home / Self Care | Payer: BC Managed Care – PPO | Source: Ambulatory Visit | Attending: Family Medicine | Admitting: Family Medicine

## 2017-07-07 DIAGNOSIS — Z1231 Encounter for screening mammogram for malignant neoplasm of breast: Secondary | ICD-10-CM

## 2017-09-24 ENCOUNTER — Encounter: Payer: Self-pay | Admitting: Family Medicine

## 2017-09-24 ENCOUNTER — Other Ambulatory Visit: Payer: Self-pay

## 2017-09-24 ENCOUNTER — Ambulatory Visit: Payer: BC Managed Care – PPO | Admitting: Family Medicine

## 2017-09-24 VITALS — BP 124/84 | HR 82 | Temp 97.8°F | Ht 62.0 in | Wt 268.2 lb

## 2017-09-24 DIAGNOSIS — M542 Cervicalgia: Secondary | ICD-10-CM

## 2017-09-24 DIAGNOSIS — L989 Disorder of the skin and subcutaneous tissue, unspecified: Secondary | ICD-10-CM | POA: Diagnosis not present

## 2017-09-24 DIAGNOSIS — M7712 Lateral epicondylitis, left elbow: Secondary | ICD-10-CM | POA: Diagnosis not present

## 2017-09-24 MED ORDER — DICLOFENAC SODIUM 75 MG PO TBEC: 75.0000 mg | DELAYED_RELEASE_TABLET | Freq: Two times a day (BID) | ORAL | 0 refills | Status: DC

## 2017-09-24 NOTE — Progress Notes (Signed)
Subjective:    Patient ID: Carol Crosby, female    DOB: [birth year], 56 y.o.   MRN: [id number]  HPI    56 year old female presents with new onset  Left elbow pain.  She has also noted 2 other issues.. Insect bote and neck pain in anterior neck.   1. Left elbow pain, noted 4 weeks ago.  No known injury or fall.  Pain over lateral elbow.. No redness,  No heat, mildno swelling lateral elbow.  Pain with motion, less at rest. Hurt with lifting cup.  No repetitive motion at work. She has been ibuprofen 4 tabs twice daily.Marland Kitchen Helps minimally.  2. Insect bite taking a long time to resolve, left anterior  Neck.. Had noted 2.. One resovled other remains.  Did not observe an insect. Dry itchy.  Has tried cortisone cream.   3. Anterior neck pain.. Sore to touch in last 6 months.  no pain with swallowing.  no ST. No fever. No swelling.  no thyroid issues.. Mother with thyroid problems.     Social History /Family History/Past Medical History reviewed in detail and updated in EMR if needed. Blood pressure 124/84, pulse 82, temperature 97.8 F (36.6 C), temperature source Oral, height 5\' 2"  (1.575 m), weight 268 lb 4 oz (121.7 kg).  Review of Systems  Constitutional: Negative for fatigue and fever.  HENT: Negative for ear pain.   Eyes: Negative for pain.  Respiratory: Negative for chest tightness and shortness of breath.   Cardiovascular: Negative for chest pain, palpitations and leg swelling.  Gastrointestinal: Negative for abdominal pain.  Genitourinary: Negative for dysuria.       Objective:   Physical Exam  Constitutional: Vital signs are normal. She appears well-developed and well-nourished. She is cooperative.  Non-toxic appearance. She does not appear ill. No distress.  HENT:  Head: Normocephalic.  Right Ear: Hearing, tympanic membrane, external ear and ear canal normal. Tympanic membrane is not erythematous, not retracted and not bulging.  Left Ear: Hearing, tympanic  membrane, external ear and ear canal normal. Tympanic membrane is not erythematous, not retracted and not bulging.  Nose: No mucosal edema or rhinorrhea. Right sinus exhibits no maxillary sinus tenderness and no frontal sinus tenderness. Left sinus exhibits no maxillary sinus tenderness and no frontal sinus tenderness.  Mouth/Throat: Uvula is midline, oropharynx is clear and moist and mucous membranes are normal.  Eyes: Conjunctivae, EOM and lids are normal. Pupils are equal, round, and reactive to light. Lids are everted and swept, no foreign bodies found.  Neck: Trachea normal and normal range of motion. Neck supple. Carotid bruit is not present. No thyroid mass and no thyromegaly present.  Cardiovascular: Normal rate, regular rhythm, S1 normal, S2 normal, normal heart sounds, intact distal pulses and normal pulses. Exam reveals no gallop and no friction rub.  No murmur heard. Pulmonary/Chest: Effort normal and breath sounds normal. No tachypnea. No respiratory distress. She has no decreased breath sounds. She has no wheezes. She has no rhonchi. She has no rales.  Abdominal: Soft. Normal appearance and bowel sounds are normal. There is no tenderness.  Musculoskeletal:       Right elbow: Normal.      Left elbow: She exhibits decreased range of motion and swelling. Tenderness found. Lateral epicondyle tenderness noted.       Right upper arm: Normal. She exhibits no bony tenderness.       Left upper arm: Normal.       Right forearm: Normal.  Left forearm: Normal.  Neurological: She is alert.  Skin: Skin is warm, dry and intact. No rash noted.  Round flaky thickened erythematous patch on anterior chest to right    Psychiatric: Her speech is normal and behavior is normal. Judgment and thought content normal. Her mood appears not anxious. Cognition and memory are normal. She does not exhibit a depressed mood.          Assessment & Plan:

## 2017-09-24 NOTE — Patient Instructions (Addendum)
Please stop at the lab to have labs drawn.  If anterior neck pain not improving.. Call for referral to thyroid US.  Apply topical steroid to  Skin lesion x 2 weeks. Call for biopsy referral if not improving.  Start ice, home exercises and diclofenac 75 mg twice daily x 2 week. If not improving call for referral to  [facility], Dr. Lorelei Pont.

## 2017-09-25 LAB — T4, FREE: FREE T4: 1 ng/dL (ref 0.8–1.8)

## 2017-09-25 LAB — T3, FREE: T3, Free: 3.4 pg/mL (ref 2.3–4.2)

## 2017-09-25 LAB — TSH: TSH: 2.66 mIU/L

## 2017-09-27 ENCOUNTER — Telehealth: Payer: Self-pay | Admitting: Family Medicine

## 2017-09-27 DIAGNOSIS — L989 Disorder of the skin and subcutaneous tissue, unspecified: Secondary | ICD-10-CM | POA: Insufficient documentation

## 2017-09-27 DIAGNOSIS — M542 Cervicalgia: Secondary | ICD-10-CM | POA: Insufficient documentation

## 2017-09-27 DIAGNOSIS — M7712 Lateral epicondylitis, left elbow: Secondary | ICD-10-CM | POA: Insufficient documentation

## 2017-09-27 NOTE — Telephone Encounter (Signed)
Copied from [place]. Topic: HIPAA Complaint >> Sep 27, 2017  1:17 PM Cecelia Byars, NT wrote: Reason for CRM: Patient was seen on Friday and was supposed to prescription for a  steroid cream , it was not called in please call in to [facility] ,call patient at [phone]

## 2017-09-27 NOTE — Assessment & Plan Note (Signed)
May be skin change from repetitive rubbing of inset bite and excessive allergic reaction vs  Squamous cell CA. Treat with topical steroid.. If not resolving consider derm referral for biopsy.

## 2017-09-27 NOTE — Assessment & Plan Note (Signed)
No abnormality noted on my exam today . eval thyroid function. If continues to be concern for pt,  can eval with [place] anterior neck.

## 2017-09-27 NOTE — Telephone Encounter (Signed)
Dr. Diona Browner,  According to your note you recommend a topical steroid cream for chest lesion and you also r/xed diclofenac for neck pain.  I am assuming that you wanted patient to use an OTC topical steroid cream such as hydrocortisone? but wanted to be sure before calling patient back  Patient was under the impression that you were going to prescribe a steroidal cream.  Please clarify and we will make patient aware.  Thanks.

## 2017-09-27 NOTE — Assessment & Plan Note (Signed)
Treat with Home pt, NSAIDs, ice and brace as needed

## 2017-09-27 NOTE — Telephone Encounter (Signed)
Patient Name: BRENNAN KARAM Gender: Female DOB: 1961-02-15 Age: 56 Y 102 M 19 D Return Phone Number: [phone] (Primary) Address: City/State/Zip: [place] [address] Client [facility] Night - Client Client Site [facility] Physician Eliezer Lofts - MD Contact Type Call Who Is Calling Patient / Member / Family / Caregiver Call Type Triage / Clinical Relationship To Patient Self Return Phone Number [phone] (Primary) Chief Complaint Prescription Refill or Medication Request (non symptomatic) Reason for Call Medication Question / Request Initial Comment Caller states one Rx was not called in seen Dr yesterday Translation No Nurse Assessment Nurse: Joline Salt, RN, Malachy Mood Date/Time (Eastern Time): 09/25/2017 7:52:58 PM Please select the assessment type ---RX called in but not at pharm Additional Documentation ---Caller states one Rx was not called in seen Dr. yesterday It is a topical steroid for a place on her chest for 2 weeks. Document the name of the medication. ---Does not know name of medication Has the office closed within the last 30 minutes? ---No Does the client directives allow for assistance with medications after hours? ---No Additional Documentation ---Patient will call her MD on Monday to get the topical cream as it is not an urgent matter. Guidelines Guideline Title Affirmed Question Affirmed Notes Nurse Date/Time (Eastern Time) Disp. Time Eilene Ghazi Time) Disposition Final User 09/25/2017 7:56:49 PM Clinical Call Yes Joline Salt, RN, Ch

## 2017-09-28 MED ORDER — TRIAMCINOLONE ACETONIDE 0.5 % EX CREA: 1.0000 "application " | TOPICAL_CREAM | Freq: Two times a day (BID) | CUTANEOUS | 0 refills | Status: DC

## 2017-09-28 NOTE — Telephone Encounter (Signed)
Pt was correct. I meant to send in rx for prescription triamcinolone cream. Rx now sent in.

## 2017-09-29 NOTE — Telephone Encounter (Signed)
Noted and did confirm with patient that she picked up R/X yesterday.  Thanks.

## 2018-01-11 ENCOUNTER — Encounter: Payer: Self-pay | Admitting: Family Medicine

## 2018-01-11 ENCOUNTER — Telehealth: Payer: Self-pay | Admitting: *Deleted

## 2018-01-11 DIAGNOSIS — M542 Cervicalgia: Secondary | ICD-10-CM

## 2018-01-11 NOTE — Telephone Encounter (Signed)
US ordered

## 2018-01-11 NOTE — Telephone Encounter (Signed)
Copied from [place]. Topic: Referral - Request >> Jan 11, 2018  1:01 PM Scherrie Gerlach wrote: Reason for CRM: pt seen 09/24/17 for anterior neck pain.  Dr Diona Browner advised at that time if not better to call back for thyroid US. Pt states she is not better and would like to proceed with [place].  Pt lives in [place], so [facility] is ok.

## 2018-01-13 ENCOUNTER — Emergency Department (HOSPITAL_BASED_OUTPATIENT_CLINIC_OR_DEPARTMENT_OTHER): Payer: Worker's Compensation

## 2018-01-13 ENCOUNTER — Encounter (HOSPITAL_BASED_OUTPATIENT_CLINIC_OR_DEPARTMENT_OTHER): Payer: Self-pay | Admitting: *Deleted

## 2018-01-13 ENCOUNTER — Other Ambulatory Visit: Payer: Self-pay

## 2018-01-13 ENCOUNTER — Emergency Department (HOSPITAL_BASED_OUTPATIENT_CLINIC_OR_DEPARTMENT_OTHER)
Admission: EM | Admit: 2018-01-13 | Discharge: 2018-01-14 | Disposition: A | Discharge status: Home / Self Care | Payer: Worker's Compensation | Attending: Emergency Medicine | Admitting: Emergency Medicine

## 2018-01-13 DIAGNOSIS — I10 Essential (primary) hypertension: Secondary | ICD-10-CM | POA: Diagnosis not present

## 2018-01-13 DIAGNOSIS — W19XXXA Unspecified fall, initial encounter: Secondary | ICD-10-CM

## 2018-01-13 DIAGNOSIS — Y929 Unspecified place or not applicable: Secondary | ICD-10-CM | POA: Diagnosis not present

## 2018-01-13 DIAGNOSIS — Z96642 Presence of left artificial hip joint: Secondary | ICD-10-CM | POA: Insufficient documentation

## 2018-01-13 DIAGNOSIS — Y9389 Activity, other specified: Secondary | ICD-10-CM | POA: Diagnosis not present

## 2018-01-13 DIAGNOSIS — Z87891 Personal history of nicotine dependence: Secondary | ICD-10-CM | POA: Diagnosis not present

## 2018-01-13 DIAGNOSIS — W07XXXA Fall from chair, initial encounter: Secondary | ICD-10-CM | POA: Insufficient documentation

## 2018-01-13 DIAGNOSIS — Z96641 Presence of right artificial hip joint: Secondary | ICD-10-CM | POA: Insufficient documentation

## 2018-01-13 DIAGNOSIS — Y990 Civilian activity done for income or pay: Secondary | ICD-10-CM | POA: Insufficient documentation

## 2018-01-13 DIAGNOSIS — M545 Low back pain: Secondary | ICD-10-CM | POA: Diagnosis present

## 2018-01-13 NOTE — ED Notes (Signed)
Distal CMS intact, 2+ DP pulses bilat

## 2018-01-13 NOTE — ED Triage Notes (Signed)
A chair slid from under her today causing her to fall onto the floor. Injury to her lower back, head, and buttocks. She hit her head on a carpet covered floor. No LOC.

## 2018-01-14 ENCOUNTER — Ambulatory Visit
Admission: RE | Admit: 2018-01-14 | Discharge: 2018-01-14 | Disposition: A | Discharge status: Home / Self Care | Payer: BC Managed Care – PPO | Source: Ambulatory Visit | Attending: Family Medicine | Admitting: Family Medicine

## 2018-01-14 ENCOUNTER — Encounter (HOSPITAL_BASED_OUTPATIENT_CLINIC_OR_DEPARTMENT_OTHER): Payer: Self-pay | Admitting: Emergency Medicine

## 2018-01-14 DIAGNOSIS — M542 Cervicalgia: Secondary | ICD-10-CM

## 2018-01-14 MED ORDER — METHOCARBAMOL 500 MG PO TABS: 1000.0000 mg | ORAL_TABLET | Freq: Once | ORAL | Status: DC

## 2018-01-14 MED ORDER — ACETAMINOPHEN 500 MG PO TABS
1000.0000 mg | ORAL_TABLET | Freq: Once | ORAL | Status: AC
  Administered 2018-01-14: 1000 mg via ORAL
  Filled 2018-01-14: qty 2

## 2018-01-14 MED ORDER — METHOCARBAMOL 500 MG PO TABS: 500.0000 mg | ORAL_TABLET | Freq: Two times a day (BID) | ORAL | 0 refills | Status: DC

## 2018-01-14 MED ORDER — IBUPROFEN 800 MG PO TABS: 800.0000 mg | ORAL_TABLET | Freq: Three times a day (TID) | ORAL | 0 refills | Status: DC

## 2018-01-14 NOTE — ED Provider Notes (Signed)
[facility] EM[facility]GENC[facility] DEPARTMENT Provider Note   CSN: [id number] Arrival date & time: 01/13/18  2142     History   Chief Complaint Chief Complaint  Patient presents with  . Fall    HPI Carol Crosby is a 57 y.o. female.  The history is provided by the patient.  Fall  This is a new problem. The current episode started 6 to 12 hours ago. The problem occurs constantly. The problem has not changed since onset.Pertinent negatives include no chest pain, no abdominal pain, no headaches and no shortness of breath. Nothing aggravates the symptoms. Nothing relieves the symptoms. She has tried nothing for the symptoms. The treatment provided no relief.  Slipped off rolling chair at work with low back pain.    Past Medical History:  Diagnosis Date  . Abdominal pain, right upper quadrant   . Acute sinusitis, unspecified   . Arthritis    knees, back, hip   . Esophageal reflux   . Excessive or frequent menstruation   . History of bronchitis   . Hypersomnia, unspecified   . Hypertension   . Mixed incontinence urge and stress (female)(female)   . Morbid obesity ([place])   . Obstructive sleep apnea (adult) (pediatric)   . Other chest pain   . Palpitations   . Scarlet fever   . Streptococcal sore throat   . Temporomandibular joint disorders, unspecified   . Unspecified urinary incontinence     Patient Active Problem List   Diagnosis Date Noted  . Left lateral epicondylitis 09/27/2017  . Anterior neck pain 09/27/2017  . Skin lesion of chest wall 09/27/2017  . 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 Unilateral primary osteoarthritis, right hip 04/21/2016  . 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 Chronic insomnia 12/31/2015  . Lipoma 12/10/2014  . Right shoulder pain 12/10/2014  . Severe headache 01/23/2014  . HTN (hypertension), benign 01/09/2014  . Right knee pain 01/09/2014  . OBSTRUCTIVE SLEEP APNEA 01/14/2009  . Morbid obesity ([place])  11/30/2007  . INCONTINENCE, MIXED, URGE/STRESS 11/30/2007    Past Surgical History:  Procedure Laterality Date  . CESAREAN SECTION  2005  . CHOLECYSTECTOMY    . EXTREMITY CYST EXCISION  2000   Wrist  . EYE SURGERY     lasik- both eyes   . FRACTURE SURGERY     R ankle- screw for fixation   . KNEE ARTHROSCOPY Left 2007  . [place]  . [place]  . TONSILLECTOMY    . TOTAL HIP ARTHROPLASTY Left 04/21/2016   Procedure: LEFT TOTAL HIP ARTHROPLASTY ANTERIOR APPROACH;  Surgeon: Mcarthur Rossetti, MD;  Location: [facility];  Service: Orthopedics;  Laterality: Left;  . TOTAL HIP ARTHROPLASTY Right 09/01/2016   Procedure: RIGHT TOTAL HIP ARTHROPLASTY ANTERIOR APPROACH and RIGHT WRIST ASPIRATION AND STEROID INJECTION;  Surgeon: Mcarthur Rossetti, MD;  Location: [facility];  Service: Orthopedics;  Laterality: Right;  . TUBAL LIGATION      OB History   None      Home Medications    Prior to Admission medications   Medication Sig Start Date End Date Taking? Authorizing Provider  diclofenac (VOLTAREN) 75 MG EC tablet Take 1 tablet (75 mg total) by mouth 2 (two) times daily. 09/24/17   Bedsole, Amy E, MD  ibuprofen (ADVIL,MOTRIN) 800 MG tablet Take 1 tablet (800 mg total) by mouth 3 (three) times daily. 01/14/18   Bianca Vester, MD  methocarbamol (ROBAXIN) 500 MG tablet Take 1 tablet (500 mg total) by mouth 2 (two) times daily. 01/14/18   Tedrick Port, MD  triamcinolone cream (KENALOG) 0.5 % Apply 1 application topically 2 (two) times daily. 09/28/17   Jinny Sanders, MD    Family History Family History  Problem Relation Age of Onset  . Diabetes Father   . Migraines Mother   . Hypertension Mother   . Fibromyalgia Sister   . Colon cancer Maternal Grandmother   . Other Brother        ? Heart issue    Social History Social History   Tobacco Use  . Smoking status: Former Smoker    Last attempt to quit: 10/26/2001    Years since quitting:  16.2  . Smokeless tobacco: Never Used  Substance Use Topics  . Alcohol use: No    Alcohol/week: 0.0 oz  . Drug use: No     Allergies   Erythromycin base   Review of Systems Review of Systems  Respiratory: Negative for shortness of breath.   Cardiovascular: Negative for chest pain.  Gastrointestinal: Negative for abdominal pain.  Genitourinary: Negative for difficulty urinating.  Musculoskeletal: Positive for back pain. Negative for gait problem.  Neurological: Negative for weakness, numbness and headaches.  All other systems reviewed and are negative.    Physical Exam Updated Vital Signs BP (!) 144/93   Pulse 72   Temp 97.6 F (36.4 C) (Oral)   Resp 20   Ht 5\' 2"  (1.575 m)   Wt 120.2 kg (265 lb)   LMP  (LMP Unknown)   SpO2 95%   BMI 48.47 kg/m   Physical Exam  Constitutional: She is oriented to person, place, and time. She appears well-developed and well-nourished. No distress.  HENT:  Head: Normocephalic and atraumatic.  Mouth/Throat: No oropharyngeal exudate.  Eyes: Pupils are equal, round, and reactive to light. Conjunctivae are normal.  Neck: Normal range of motion. Neck supple.  Cardiovascular: Normal rate, regular rhythm, normal heart sounds and intact distal pulses.  Pulmonary/Chest: Effort normal and breath sounds normal. No stridor. She has no wheezes. She has no rales.  Abdominal: Soft. Bowel sounds are normal. She exhibits no mass. There is no tenderness. There is no rebound and no guarding.  Musculoskeletal: She exhibits no tenderness.       Right hip: Normal.       Left hip: Normal.       Cervical back: Normal.       Thoracic back: Normal.  Neurological: She is alert and oriented to person, place, and time. She displays normal reflexes.  Skin: Skin is warm and dry. Capillary refill takes less than 2 seconds.  Psychiatric: She has a normal mood and affect.     ED Treatments / Results    Radiology Dg Lumbar Spine Complete  Result Date:  01/14/2018 CLINICAL DATA:  Fall with low back pain EXAM: LUMBAR SPINE - COMPLETE 4+ VIEW COMPARISON:  None. FINDINGS: Lumbar alignment within normal limits. Vertebral body heights are maintained. Mild degenerative changes at L1-L2, L2-L3, L3-L4 and L5-S1. IMPRESSION: Mild degenerative changes.  No acute osseous abnormality. Electronically Signed   By: Donavan Foil M.D.   On: 01/14/2018 00:06    Procedures Procedures (including critical care time)  Medications Ordered in ED Medications  acetaminophen (TYLENOL) tablet 1,000 mg (1,000 mg Oral Given 01/14/18 0026)    Final Clinical Impressions(s) / ED Diagnoses   Final diagnoses:  Fall, initial encounter   Return for  weakness, numbness, changes in vision or speech, fevers >100.4 unrelieved by medication, shortness of breath, intractable vomiting, or diarrhea, abdominal pain, Inability to tolerate liquids or food, cough, altered mental status or any concerns. No signs of systemic illness or infection. The patient is nontoxic-appearing on exam and vital signs are within normal limits.   I have reviewed the triage vital signs and the nursing notes. Pertinent labs &imaging results that were available during my care of the patient were reviewed by me and considered in my medical decision making (see chart for details).  After history, exam, and medical workup I feel the patient has been appropriately medically screened and is safe for discharge home. Pertinent diagnoses were discussed with the patient. Patient was given return precautions. ED Discharge Orders        Ordered    ibuprofen (ADVIL,MOTRIN) 800 MG tablet  3 times daily     01/14/18 0010    methocarbamol (ROBAXIN) 500 MG tablet  2 times daily     01/14/18 0010       Fleet Higham, MD 01/14/18 [id number]

## 2018-01-14 NOTE — ED Notes (Signed)
Pt teaching provided on medications that may cause drowsiness. Pt instructed not to drive or operate heavy machinery while taking the prescribed medication. Pt verbalized understanding.   

## 2018-01-19 ENCOUNTER — Encounter: Payer: Self-pay | Admitting: Family Medicine

## 2018-01-19 DIAGNOSIS — R0989 Other specified symptoms and signs involving the circulatory and respiratory systems: Secondary | ICD-10-CM

## 2018-01-19 DIAGNOSIS — M542 Cervicalgia: Secondary | ICD-10-CM

## 2018-02-13 ENCOUNTER — Other Ambulatory Visit: Payer: Self-pay

## 2018-02-13 ENCOUNTER — Emergency Department (HOSPITAL_COMMUNITY)
Admission: EM | Admit: 2018-02-13 | Discharge: 2018-02-13 | Disposition: A | Discharge status: Home / Self Care | Payer: BC Managed Care – PPO | Attending: Emergency Medicine | Admitting: Emergency Medicine

## 2018-02-13 ENCOUNTER — Encounter (HOSPITAL_COMMUNITY): Payer: Self-pay

## 2018-02-13 DIAGNOSIS — R197 Diarrhea, unspecified: Secondary | ICD-10-CM | POA: Diagnosis not present

## 2018-02-13 DIAGNOSIS — I10 Essential (primary) hypertension: Secondary | ICD-10-CM | POA: Insufficient documentation

## 2018-02-13 DIAGNOSIS — Z96643 Presence of artificial hip joint, bilateral: Secondary | ICD-10-CM | POA: Insufficient documentation

## 2018-02-13 DIAGNOSIS — Z87891 Personal history of nicotine dependence: Secondary | ICD-10-CM | POA: Diagnosis not present

## 2018-02-13 DIAGNOSIS — R55 Syncope and collapse: Secondary | ICD-10-CM | POA: Diagnosis not present

## 2018-02-13 DIAGNOSIS — R112 Nausea with vomiting, unspecified: Secondary | ICD-10-CM | POA: Insufficient documentation

## 2018-02-13 LAB — CBC
HCT: 49.1 % — ABNORMAL HIGH (ref 36.0–46.0)
Hemoglobin: 15.4 g/dL — ABNORMAL HIGH (ref 12.0–15.0)
MCH: 29.9 pg (ref 26.0–34.0)
MCHC: 31.4 g/dL (ref 30.0–36.0)
MCV: 95.3 fL (ref 78.0–100.0)
PLATELETS: 302 10*3/uL (ref 150–400)
RBC: 5.15 MIL/uL — ABNORMAL HIGH (ref 3.87–5.11)
RDW: 14.2 % (ref 11.5–15.5)
WBC: 11 10*3/uL — AB (ref 4.0–10.5)

## 2018-02-13 LAB — URINALYSIS, ROUTINE W REFLEX MICROSCOPIC
Bilirubin Urine: NEGATIVE
Glucose, UA: NEGATIVE mg/dL
Hgb urine dipstick: NEGATIVE
Ketones, ur: NEGATIVE mg/dL
NITRITE: NEGATIVE
PH: 5 (ref 5.0–8.0)
Protein, ur: NEGATIVE mg/dL
SPECIFIC GRAVITY, URINE: 1.027 (ref 1.005–1.030)

## 2018-02-13 LAB — BASIC METABOLIC PANEL
Anion gap: 12 (ref 5–15)
BUN: 17 mg/dL (ref 6–20)
CALCIUM: 8.9 mg/dL (ref 8.9–10.3)
CO2: 23 mmol/L (ref 22–32)
CREATININE: 0.77 mg/dL (ref 0.44–1.00)
Chloride: 105 mmol/L (ref 101–111)
GFR calc Af Amer: >60 mL/min (ref >60)
Glucose, Bld: 121 mg/dL — ABNORMAL HIGH (ref 65–99)
POTASSIUM: 4.1 mmol/L (ref 3.5–5.1)
SODIUM: 140 mmol/L (ref 135–145)

## 2018-02-13 LAB — CBG MONITORING, ED: Glucose-Capillary: 107 mg/dL — ABNORMAL HIGH (ref 65–99)

## 2018-02-13 MED ORDER — SODIUM CHLORIDE 0.9 % IV BOLUS
1000.0000 mL | Freq: Once | INTRAVENOUS | Status: AC
  Administered 2018-02-13: 1000 mL via INTRAVENOUS

## 2018-02-13 MED ORDER — ONDANSETRON HCL 4 MG/2ML IJ SOLN
4.0000 mg | Freq: Once | INTRAMUSCULAR | Status: AC
  Administered 2018-02-13: 4 mg via INTRAVENOUS
  Filled 2018-02-13: qty 2

## 2018-02-13 MED ORDER — ONDANSETRON 4 MG PO TBDP: 4.0000 mg | ORAL_TABLET | Freq: Three times a day (TID) | ORAL | 0 refills | Status: DC | PRN

## 2018-02-13 NOTE — ED Triage Notes (Signed)
Pt vomited 4 times since this AM.. While in the bathroom pt "passed out" and fell, hitting head on tub.

## 2018-02-13 NOTE — ED Notes (Signed)
Pt provided w/labeled specimen cup and culture tube re: U/A order. ENM

## 2018-02-13 NOTE — ED Notes (Signed)
Pt given ice water.

## 2018-02-13 NOTE — Discharge Instructions (Addendum)
Please read instructions below. Drink clear liquids until your stomach feels better. Then, slowly introduce bland foods into your diet as tolerated, such as bread, rice, apples, bananas. You can take zofran every 8 hours as needed for nausea. Apply ice to your head for 20 minutes at a time for swelling and pain. You can take tylenol as needed for pain. Follow up with your primary care if symptoms persist. Return to the ER for severe headache, vision changes, inability to move your eyes in all directions, severe abdominal pain, fever, uncontrollable vomiting, or new or concerning symptoms.

## 2018-02-13 NOTE — ED Notes (Signed)
Pt only had BM when using restroom earlier

## 2018-02-13 NOTE — ED Notes (Signed)
Pt using restroom

## 2018-02-13 NOTE — ED Provider Notes (Signed)
[facility] DEPT Provider Note   CSN: [id number] Arrival date & time: 02/13/18  1112     History   Chief Complaint Chief Complaint  Patient presents with  . Loss of Consciousness  . Emesis    HPI Carol Crosby is a 57 y.o. female w PMHx HTN, OSA, presenting to the ED with N/V/D that began this morning around 0600.  Nonbloody nonbilious emesis, and nonbloody diarrhea.  Also reports an episode of syncope that occurred while pt was having a bowel movement.  She states she passed out falling forward striking her left brow on the bathroom tub.  She states she woke up on the floor and has had some headaches since.  Denies vision changes, abdominal pain, urinary symptoms, chest pain, or other complaints.  No medications tried prior to arrival.  Abdominal surgeries include cholecystectomy.  Not on anticoagulation.  The history is provided by the patient.    Past Medical History:  Diagnosis Date  . Abdominal pain, right upper quadrant   . Acute sinusitis, unspecified   . Arthritis    knees, back, hip   . Esophageal reflux   . Excessive or frequent menstruation   . History of bronchitis   . Hypersomnia, unspecified   . Hypertension   . Mixed incontinence urge and stress (female)(female)   . Morbid obesity ([place])   . Obstructive sleep apnea (adult) (pediatric)   . Other chest pain   . Palpitations   . Scarlet fever   . Streptococcal sore throat   . Temporomandibular joint disorders, unspecified   . Unspecified urinary incontinence     Patient Active Problem List   Diagnosis Date Noted  . Left lateral epicondylitis 09/27/2017  . Anterior neck pain 09/27/2017  . Skin lesion of chest wall 09/27/2017  . 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 Unilateral primary osteoarthritis, right hip 04/21/2016  . 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 Chronic insomnia 12/31/2015  . Lipoma 12/10/2014  . Right shoulder pain  12/10/2014  . Severe headache 01/23/2014  . HTN (hypertension), benign 01/09/2014  . Right knee pain 01/09/2014  . OBSTRUCTIVE SLEEP APNEA 01/14/2009  . Morbid obesity ([place]) 11/30/2007  . INCONTINENCE, MIXED, URGE/STRESS 11/30/2007    Past Surgical History:  Procedure Laterality Date  . CESAREAN SECTION  2005  . CHOLECYSTECTOMY    . EXTREMITY CYST EXCISION  2000   Wrist  . EYE SURGERY     lasik- both eyes   . FRACTURE SURGERY     R ankle- screw for fixation   . KNEE ARTHROSCOPY Left 2007  . [place]  . [place]  . TONSILLECTOMY    . TOTAL HIP ARTHROPLASTY Left 04/21/2016   Procedure: LEFT TOTAL HIP ARTHROPLASTY ANTERIOR APPROACH;  Surgeon: Mcarthur Rossetti, MD;  Location: [facility];  Service: Orthopedics;  Laterality: Left;  . TOTAL HIP ARTHROPLASTY Right 09/01/2016   Procedure: RIGHT TOTAL HIP ARTHROPLASTY ANTERIOR APPROACH and RIGHT WRIST ASPIRATION AND STEROID INJECTION;  Surgeon: Mcarthur Rossetti, MD;  Location: [facility];  Service: Orthopedics;  Laterality: Right;  . TUBAL LIGATION       OB History   None      Home Medications    Prior to Admission medications   Medication Sig Start Date End Date Taking? Authorizing Provider  ibuprofen (ADVIL,MOTRIN) 200 MG tablet Take 800 mg by mouth every 6 (six) hours as needed for moderate pain.  Yes [provider]  diclofenac (VOLTAREN) 75 MG EC tablet Take 1 tablet (75 mg total) by mouth 2 (two) times daily. Patient not taking: Reported on 02/13/2018 09/24/17   Jinny Sanders, MD  ibuprofen (ADVIL,MOTRIN) 800 MG tablet Take 1 tablet (800 mg total) by mouth 3 (three) times daily. Patient not taking: Reported on 02/13/2018 01/14/18   Randal Buba, April, MD  methocarbamol (ROBAXIN) 500 MG tablet Take 1 tablet (500 mg total) by mouth 2 (two) times daily. Patient not taking: Reported on 02/13/2018 01/14/18   Palumbo, April, MD  ondansetron (ZOFRAN ODT) 4 MG disintegrating tablet  Take 1 tablet (4 mg total) by mouth every 8 (eight) hours as needed for nausea or vomiting. 02/13/18   Mohammed Mcandrew, [place] N, PA-C  triamcinolone cream (KENALOG) 0.5 % Apply 1 application topically 2 (two) times daily. Patient not taking: Reported on 02/13/2018 09/28/17   Jinny Sanders, MD    Family History Family History  Problem Relation Age of Onset  . Diabetes Father   . Migraines Mother   . Hypertension Mother   . Fibromyalgia Sister   . Colon cancer Maternal Grandmother   . Other Brother        ? Heart issue    Social History Social History   Tobacco Use  . Smoking status: Former Smoker    Last attempt to quit: 10/26/2001    Years since quitting: 16.3  . Smokeless tobacco: Never Used  Substance Use Topics  . Alcohol use: No    Alcohol/week: 0.0 oz  . Drug use: No     Allergies   Erythromycin base   Review of Systems Review of Systems  Constitutional: Negative for chills and fever.  Eyes: Negative for photophobia, pain and visual disturbance.  Cardiovascular: Negative for chest pain and palpitations.  Gastrointestinal: Positive for blood in stool, diarrhea, nausea and vomiting. Negative for abdominal pain.  Genitourinary: Negative for dysuria and frequency.  Skin: Positive for color change.  Neurological: Positive for syncope and headaches.  Hematological: Does not bruise/bleed easily.  Psychiatric/Behavioral: Negative for confusion.  All other systems reviewed and are negative.    Physical Exam Updated Vital Signs BP 132/69 (BP Location: Right Arm)   Pulse 91   Temp (!) 97.5 F (36.4 C) (Oral)   Resp 14   Ht 5\' 2"  (1.575 m)   Wt 120.2 kg (265 lb)   LMP  (LMP Unknown)   SpO2 95%   BMI 48.47 kg/m   Physical Exam  Constitutional: She is oriented to person, place, and time. She appears well-developed and well-nourished. No distress.  HENT:  Head: Normocephalic and atraumatic.  Hematoma present over left brow with associated tenderness. No tenderness to  remainder of orbit. No crepitus to facial bones or obvious deformity.  Extraocular movements intact without entrapment. No subconjunctival hemorrhage. No battle sign or racoon eyes. TMs normal.  Eyes: Pupils are equal, round, and reactive to light. Conjunctivae and EOM are normal.  Neck: Normal range of motion. Neck supple.  Cardiovascular: Normal rate, regular rhythm, normal heart sounds and intact distal pulses.  Pulmonary/Chest: Effort normal and breath sounds normal. No respiratory distress.  Abdominal: Soft. Bowel sounds are normal. There is no tenderness. There is no rigidity, no rebound and no guarding.  Neurological: She is alert and oriented to person, place, and time.  Mental Status:  Alert, oriented, thought content appropriate, able to give a coherent history. Speech fluent without evidence of aphasia. Able to follow 2 step commands without  difficulty.  Cranial Nerves:  II:  Peripheral visual fields grossly normal, pupils equal, round, reactive to light III,IV, VI: ptosis not present, extra-ocular motions intact bilaterally  V,VII: smile symmetric, facial light touch sensation equal VIII: hearing grossly normal to voice  X: uvula elevates symmetrically  XI: bilateral shoulder shrug symmetric and strong XII: midline tongue extension without fassiculations Motor:  Normal tone. 5/5 in upper and lower extremities bilaterally including strong and equal grip strength and dorsiflexion/plantar flexion Sensory: Pinprick and light touch normal in all extremities.  Deep Tendon Reflexes: 2+ and symmetric in the biceps and patella Cerebellar: normal finger-to-nose with bilateral upper extremities Gait: normal gait and balance CV: distal pulses palpable throughout    Skin: Skin is warm.  Psychiatric: She has a normal mood and affect. Her behavior is normal.  Nursing note and vitals reviewed.    ED Treatments / Results  Labs (all labs ordered are listed, but only abnormal results are  displayed) Labs Reviewed  BASIC METABOLIC PANEL - Abnormal; Notable for the following components:      Result Value   Glucose, Bld 121 (*)    All other components within normal limits  CBC - Abnormal; Notable for the following components:   WBC 11.0 (*)    RBC 5.15 (*)    Hemoglobin 15.4 (*)    HCT 49.1 (*)    All other components within normal limits  URINALYSIS, ROUTINE W REFLEX MICROSCOPIC - Abnormal; Notable for the following components:   APPearance CLOUDY (*)    Leukocytes, UA TRACE (*)    Bacteria, UA MANY (*)    Squamous Epithelial / LPF 6-30 (*)    All other components within normal limits  CBG MONITORING, ED - Abnormal; Notable for the following components:   Glucose-Capillary 107 (*)    All other components within normal limits    EKG EKG Interpretation  Date/Time:  Sunday February 13 2018 11:31:12 EDT Ventricular Rate:  89 PR Interval:    QRS Duration: 91 QT Interval:  357 QTC Calculation: 435 R Axis:   64 Text Interpretation:  Sinus rhythm Borderline short PR interval Low voltage, precordial leads Confirmed by Nat Christen [phone]) on 02/13/2018 3:28:43 PM   Radiology No results found.  Procedures Procedures (including critical care time)  Medications Ordered in ED Medications  ondansetron (ZOFRAN) injection 4 mg (4 mg Intravenous Given 02/13/18 1343)  sodium chloride 0.9 % bolus 1,000 mL (0 mLs Intravenous Stopped 02/13/18 1349)     Initial Impression / Assessment and Plan / ED Course  I have reviewed the triage vital signs and the nursing notes.  Pertinent labs & imaging results that were available during my care of the patient were reviewed by me and considered in my medical decision making (see chart for details).     Patient presented to the ED with nausea, vomiting and diarrhea that began this morning, no abd pain. Abdomen is benign on exam. Presentation consistent with viral gastroenteritis.  Patient also reporting syncopal episode that occurred  while having a bowel movement, with trauma to her left brow. TTP localized to hematoma, remainder of orbit without tenderness, no entrapment. Normal neuro exam. Suspect vasovagal episode due to bowel movement as cause of syncope. VSS. CBC with slight leukocytosis, BMP wnl, EKG without arrhythmia. Pt discussed with Dr. Lacinda Axon. Will rehydrate and control nausea.   Pt have 2 IVs that blew, though had dose of IV zofran prior to this. Pt orally rehydrated and tolerating in ED. Reporting improvement  in sx. Pt evaluated by Dr. Lacinda Axon, who agrees with discharge w symptomatic management and return precautions. PCP follow up.  Discussed results, findings, treatment and follow up. Patient advised of return precautions. Patient verbalized understanding and agreed with plan.   Final Clinical Impressions(s) / ED Diagnoses   Final diagnoses:  Nausea vomiting and diarrhea  Vasovagal syncope    ED Discharge Orders        Ordered    ondansetron (ZOFRAN ODT) 4 MG disintegrating tablet  Every 8 hours PRN     02/13/18 1630       Maryhelen Lindler, [place] N, PA-C 02/13/18 1633    Nat Christen, MD 02/16/18 [phone]

## 2018-02-13 NOTE — ED Notes (Signed)
Pt IV has blown. Pt has had two IV's and both have blow. I have checked with ED PA and pt may orally hydrate. If pt Can not tolerate RN informed pt that she will need IV Ultrasound and IV Fluids. I have provided pt with water.

## 2018-05-14 ENCOUNTER — Other Ambulatory Visit (HOSPITAL_COMMUNITY)
Admit: 2018-05-14 | Discharge: 2018-05-14 | Disposition: A | Discharge status: Home / Self Care | Payer: BC Managed Care – PPO | Source: Other Acute Inpatient Hospital | Attending: Family Medicine | Admitting: Family Medicine

## 2018-05-14 ENCOUNTER — Encounter: Payer: Self-pay | Admitting: Family Medicine

## 2018-05-14 ENCOUNTER — Ambulatory Visit: Payer: BC Managed Care – PPO | Admitting: Family Medicine

## 2018-05-14 VITALS — BP 132/86 | HR 87 | Temp 98.6°F | Ht 62.0 in

## 2018-05-14 DIAGNOSIS — R300 Dysuria: Secondary | ICD-10-CM

## 2018-05-14 LAB — POC URINALSYSI DIPSTICK (AUTOMATED)
Bilirubin, UA: NEGATIVE
Glucose, UA: NEGATIVE
Ketones, UA: NEGATIVE
LEUKOCYTES UA: NEGATIVE
NITRITE UA: NEGATIVE
PH UA: 6 (ref 5.0–8.0)
PROTEIN UA: POSITIVE — AB
Spec Grav, UA: >=1.03 — AB (ref 1.010–1.025)
Urobilinogen, UA: 0.2 E.U./dL

## 2018-05-14 MED ORDER — CEPHALEXIN 500 MG PO CAPS: 500.0000 mg | ORAL_CAPSULE | Freq: Two times a day (BID) | ORAL | 0 refills | Status: AC

## 2018-05-14 NOTE — Patient Instructions (Signed)
It was nice to see you!  You have a urinary tract infection. Please start the antibiotic.  We will check a urine culture to make sure you do not have a resistant bacteria. We will call you if we need to change your medications.   Please make sure you are drinking plenty of fluids over the next few days.  If your symptoms do not improve over the next 5-7 days, or if they worsen, please let us know. Please also let us know if you have worsening back pain, fevers, chills, or body aches.   Take care, Dr Adreana Coull  

## 2018-05-14 NOTE — Progress Notes (Signed)
   Subjective:  Carol Crosby is a 57 y.o. female who presents today for same-day appointment with a chief complaint of dysuria.   HPI:  Dysuria, Acute problem Started about 2 days ago. Worsened over that time. Associated with urgency and frequency.  She has not tried any treatments.  No fevers or chills.  No back pain.  No prior history of UTI.  No obvious precipitating events.  No other obvious alleviating or aggravating factors.  ROS: Per HPI  PMH: She reports that she quit smoking about 16 years ago. She has never used smokeless tobacco. She reports that she does not drink alcohol or use drugs.  Objective:  Physical Exam: BP 132/86 (BP Location: Left Arm, Patient Position: Sitting, Cuff Size: Large)   Pulse 87   Temp 98.6 F (37 C) (Oral)   Ht 5\' 2"  (1.575 m)   LMP  (LMP Unknown)   SpO2 95%   BMI 48.47 kg/m   Gen: NAD, resting comfortably CV: RRR with no murmurs appreciated Pulm: NWOB, CTAB with no crackles, wheezes, or rhonchi GI: Normal bowel sounds present. Soft, Nontender, Nondistended. MSK: No CVA tenderness.  Results for orders placed or performed in visit on 05/14/18 (from the past 24 hour(s))  POCT Urinalysis Dipstick (Automated)     Status: Abnormal   Collection Time: 05/14/18  1:08 PM  Result Value Ref Range   Color, UA dark    Clarity, UA cloudy    Glucose, UA Negative Negative   Bilirubin, UA neg    Ketones, UA neg    Spec Grav, UA >=1.030 (A) 1.010 - 1.025   Blood, UA 3+    pH, UA 6.0 5.0 - 8.0   Protein, UA Positive (A) Negative   Urobilinogen, UA 0.2 0.2 or 1.0 E.U./dL   Nitrite, UA neg    Leukocytes, UA Negative Negative     Assessment/Plan:  Dysuria/hematuria History consistent with UTI.  Will start empiric Keflex 500mg  bid for 7 days.  Her UA is equivocal. We will check urine culture for definitive diagnosis. Encouraged good oral hydration. Discussed reasons to return to care.   Algis Greenhouse. Jerline Pain, MD 05/14/2018 1:13 PM

## 2018-05-16 ENCOUNTER — Ambulatory Visit: Payer: BC Managed Care – PPO | Admitting: Family Medicine

## 2018-05-16 LAB — URINE CULTURE

## 2018-05-16 NOTE — Progress Notes (Signed)
Dr Marigene Ehlers interpretation of your lab work:  Your urine culture confirms a UTI and the antibiotic we have you on will treat the bacteria causing your UTI. We do not need to make any changes. Please follow up if your symptoms do not improve with treatment.    If you have any additional questions, please give [place] a call or send [place] a message through [place].  Take care, Dr Jerline Pain

## 2018-07-18 ENCOUNTER — Other Ambulatory Visit: Payer: Self-pay | Admitting: Family Medicine

## 2018-07-18 DIAGNOSIS — Z1231 Encounter for screening mammogram for malignant neoplasm of breast: Secondary | ICD-10-CM

## 2018-08-01 ENCOUNTER — Ambulatory Visit
Admission: RE | Admit: 2018-08-01 | Discharge: 2018-08-01 | Disposition: A | Discharge status: Home / Self Care | Payer: BC Managed Care – PPO | Source: Ambulatory Visit | Attending: Family Medicine | Admitting: Family Medicine

## 2018-08-01 DIAGNOSIS — Z1231 Encounter for screening mammogram for malignant neoplasm of breast: Secondary | ICD-10-CM

## 2019-05-05 ENCOUNTER — Ambulatory Visit: Payer: BC Managed Care – PPO | Admitting: Family Medicine

## 2019-05-05 ENCOUNTER — Other Ambulatory Visit: Payer: Self-pay

## 2019-05-05 DIAGNOSIS — L989 Disorder of the skin and subcutaneous tissue, unspecified: Secondary | ICD-10-CM | POA: Diagnosis not present

## 2019-05-05 NOTE — Patient Instructions (Signed)
No red flags. Call if area increasing in pain persistently or if increasing in size.

## 2019-05-05 NOTE — Assessment & Plan Note (Signed)
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  no red flags, nml neuro exam. No indicaiton for Ct head.

## 2019-05-05 NOTE — Progress Notes (Signed)
Chief Complaint  Patient presents with  . Knot on Left Side of Forehead    History of Present Illness: HPI   58 year old female patient presetns for eval of lump on head.   She reports 01/2018.. she had GI illness, lost consciousness and hit head.. left forehead.   Given IV fluids.  No imaging.  For last year she has  Soreness and swollen area over left eyebrow ridge.   2 days ago she had increase in pain over left eyebrow... yesterday she noted bruising.  are is some better now but still a little tender.   No recent re-injury. No fall.   NO new numbness, no weakness, no slurred speech, no vision change.  no fever.  Some increased stress.  COVID 19 screen No recent travel or known exposure to COVID19 The patient denies respiratory symptoms of COVID 19 at this time.  The importance of social distancing was discussed today.   ROS    Past Medical History:  Diagnosis Date  . Abdominal pain, right upper quadrant   . Acute sinusitis, unspecified   . Arthritis    knees, back, hip   . Esophageal reflux   . Excessive or frequent menstruation   . History of bronchitis   . Hypersomnia, unspecified   . Hypertension   . Mixed incontinence urge and stress (female)(female)   . Morbid obesity ([place])   . Obstructive sleep apnea (adult) (pediatric)   . Other chest pain   . Palpitations   . Scarlet fever   . Streptococcal sore throat   . Temporomandibular joint disorders, unspecified   . Unspecified urinary incontinence     reports that she quit smoking about 17 years ago. She has never used smokeless tobacco. She reports that she does not drink alcohol or use drugs.   Current Outpatient Medications:  .  doxycycline (VIBRAMYCIN) 50 MG capsule, Take 50 mg by mouth daily., Disp: , Rfl:  .  metroNIDAZOLE (METROGEL) 0.75 % gel, APPLY TO FACE ONCE DAILY, Disp: , Rfl:  .  mupirocin ointment (BACTROBAN) 2 %, APPLY TO SURGERY SITE DAILY, Disp: , Rfl:    Observations/Objective: Blood  pressure 138/90, pulse 96, temperature 98.1 F (36.7 C), temperature source Temporal, height 5' 1.5" (1.562 m), weight 277 lb 8 oz (125.9 kg).  Physical Exam Constitutional:      General: She is not in acute distress.    Appearance: Normal appearance. She is well-developed. She is not ill-appearing or toxic-appearing.  HENT:     Head: Normocephalic.     Right Ear: Hearing, tympanic membrane, ear canal and external ear normal. Tympanic membrane is not erythematous, retracted or bulging.     Left Ear: Hearing, tympanic membrane, ear canal and external ear normal. Tympanic membrane is not erythematous, retracted or bulging.     Nose: No mucosal edema or rhinorrhea.     Right Sinus: No maxillary sinus tenderness or frontal sinus tenderness.     Left Sinus: No maxillary sinus tenderness or frontal sinus tenderness.     Mouth/Throat:     Pharynx: Uvula midline.  Eyes:     General: Lids are normal. Lids are everted, no foreign bodies appreciated.     Conjunctiva/sclera: Conjunctivae normal.     Pupils: Pupils are equal, round, and reactive to light.  Neck:     Musculoskeletal: Normal range of motion and neck supple.     Thyroid: No thyroid mass or thyromegaly.     Vascular: No carotid bruit.  Trachea: Trachea normal.  Cardiovascular:     Rate and Rhythm: Normal rate and regular rhythm.     Pulses: Normal pulses.     Heart sounds: Normal heart sounds, S1 normal and S2 normal. No murmur. No friction rub. No gallop.   Pulmonary:     Effort: Pulmonary effort is normal. No tachypnea or respiratory distress.     Breath sounds: Normal breath sounds. No decreased breath sounds, wheezing, rhonchi or rales.  Abdominal:     General: Bowel sounds are normal.     Palpations: Abdomen is soft.     Tenderness: There is no abdominal tenderness.  Skin:    General: Skin is warm and dry.     Findings: No rash.     Comments: Left  Forehead superior to eyebrow and slightly above temporal area... 1 cm  diameter mobile lesion.. cystic feeling, slight bruising , no wramth, mild tenderness  Neurological:     Mental Status: She is alert and oriented to person, place, and time.     GCS: GCS eye subscore is 4. GCS verbal subscore is 5. GCS motor subscore is 6.     Cranial Nerves: No cranial nerve deficit.     Sensory: No sensory deficit.     Motor: No abnormal muscle tone.     Coordination: Coordination normal.     Gait: Gait normal.     Deep Tendon Reflexes: Reflexes are normal and symmetric.     Comments: Nml cerebellar exam   No papilledema  Psychiatric:        Mood and Affect: Mood is not anxious or depressed.        Speech: Speech normal.        Behavior: Behavior normal. Behavior is cooperative.        Thought Content: Thought content normal.        Cognition and Memory: Memory is not impaired. She does not exhibit impaired recent memory or impaired remote memory.        Judgment: Judgment normal.      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  no red flags, nml neuro exam. No indicaiton for Ct head.       Eliezer Lofts, MD

## 2019-07-24 NOTE — Telephone Encounter (Signed)
Patient has scheduled virtual visit 07/25/2019 with Dr. Diona Browner.

## 2019-07-25 ENCOUNTER — Ambulatory Visit (INDEPENDENT_AMBULATORY_CARE_PROVIDER_SITE_OTHER): Payer: BC Managed Care – PPO | Admitting: Family Medicine

## 2019-07-25 ENCOUNTER — Encounter: Payer: Self-pay | Admitting: Family Medicine

## 2019-07-25 ENCOUNTER — Telehealth: Payer: Self-pay | Admitting: *Deleted

## 2019-07-25 VITALS — Temp 97.3°F | Ht 61.5 in

## 2019-07-25 DIAGNOSIS — J309 Allergic rhinitis, unspecified: Secondary | ICD-10-CM | POA: Diagnosis not present

## 2019-07-25 MED ORDER — FLUTICASONE PROPIONATE 50 MCG/ACT NA SUSP: 2.0000 | Freq: Every day | NASAL | 6 refills | Status: DC

## 2019-07-25 NOTE — Assessment & Plan Note (Signed)
Not clearly covid19.  Improved some with allergy med.  Continue zyrtec, add flonase and nasal saline. If progressing and not improving... consider [id number] testing.. info given.

## 2019-07-25 NOTE — Patient Instructions (Addendum)
Start flonase 2 spray per nostril daily.  Continue zyrtec at bedtime.  Start nasal saline irrigation or spray.   Consider  coronavirus testing.

## 2019-07-25 NOTE — Telephone Encounter (Signed)
Patient left a voicemail stating that she is a little confused and wants to know if Dr. Diona Browner is going to send her a script in for the Flonase? Patient stated that it is expensive OTC. Pharmacy [facility]

## 2019-07-25 NOTE — Progress Notes (Signed)
VIRTUAL VISIT Due to national recommendations of social distancing due to [place] 19, a virtual visit is felt to be most appropriate for this patient at this time.   I connected with the patient on 07/25/19 at  9:00 AM EDT by virtual telehealth platform and verified that I am speaking with the correct person using two identifiers.   I discussed the limitations, risks, security and privacy concerns of performing an evaluation and management service by  virtual telehealth platform and the availability of in person appointments. I also discussed with the patient that there may be a patient responsible charge related to this service. The patient expressed understanding and agreed to proceed.  Patient location: Home Provider Location: [place] Participants: Eliezer Lofts and Era Skeen   Chief Complaint  Patient presents with  . Scratchy Throat  . Nasal Congestion    History of Present Illness:  58 year old female patient presents with new onset  Nasal congestion, headache, facial pain, scratchy throat, sneeze x 3 days. She always ets allergies this time of year.   No fever, no cough, no SOB, no wheeze, no ear pain   Tried  Zyrtec.. helped significantly.  Ibuprofen prn headache.. headache now gone.  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  The importance of social distancing was discussed today.   Review of Systems  Constitutional: Negative for chills and fever.  HENT: Positive for congestion, sinus pain and sore throat. Negative for ear pain.   Eyes: Negative for pain and redness.  Respiratory: Negative for cough and shortness of breath.   Cardiovascular: Negative for chest pain, palpitations and leg swelling.  Gastrointestinal: Negative for abdominal pain, blood in stool, constipation, diarrhea, nausea and vomiting.  Genitourinary: Negative for dysuria.   Musculoskeletal: Negative for falls and myalgias.  Skin: Negative for rash.  Neurological: Negative for dizziness.  Psychiatric/Behavioral: Negative for depression. The patient is not nervous/anxious.       Past Medical History:  Diagnosis Date  . Abdominal pain, right upper quadrant   . Acute sinusitis, unspecified   . Arthritis    knees, back, hip   . Esophageal reflux   . Excessive or frequent menstruation   . History of bronchitis   . Hypersomnia, unspecified   . Hypertension   . Mixed incontinence urge and stress (female)(female)   . Morbid obesity ([place])   . Obstructive sleep apnea (adult) (pediatric)   . Other chest pain   . Palpitations   . Scarlet fever   . Streptococcal sore throat   . Temporomandibular joint disorders, unspecified   . Unspecified urinary incontinence     reports that she quit smoking about 17 years ago. She has never used smokeless tobacco. She reports that she does not drink alcohol or use drugs.   Current Outpatient Medications:  .  doxycycline (VIBRAMYCIN) 50 MG capsule, Take 50 mg by mouth daily., Disp: , Rfl:  .  metroNIDAZOLE (METROGEL) 0.75 % gel, APPLY TO FACE ONCE DAILY, Disp: , Rfl:    Observations/Objective: Temperature (!) 97.3 F (36.3 C), temperature source Oral, height 5' 1.5" (1.562 m).  Physical Exam  Physical Exam Constitutional:      General: The patient is not in acute distress. Pulmonary:     Effort: Pulmonary effort is normal. No respiratory distress.  Neurological:     Mental Status: The patient is alert  and oriented to person, place, and time.  Psychiatric:        Mood and Affect: Mood normal.        Behavior: Behavior normal.   Assessment and Plan    I discussed the assessment and treatment plan with the patient. The patient was provided an opportunity to ask questions and all were answered. The patient agreed with the plan and demonstrated an understanding of the instructions.   The patient was advised to call  back or seek an in-person evaluation if the symptoms worsen or if the condition fails to improve as anticipated.     Eliezer Lofts, MD

## 2019-07-25 NOTE — Telephone Encounter (Signed)
Left message for Carol Crosby that Dr. Diona Browner did go ahead and send her in a Rx for flonase to [facility].

## 2019-07-25 NOTE — Telephone Encounter (Signed)
I was not but if cheaper for her,  I will!  Sent in rx to [facility].

## 2020-04-22 NOTE — Telephone Encounter (Signed)
Pt left v/m wanting to know if med was going to be called in for pt. Pt request cb.

## 2020-04-23 MED ORDER — MIRABEGRON ER 25 MG PO TB24: 25.0000 mg | ORAL_TABLET | Freq: Every day | ORAL | 3 refills | Status: DC

## 2020-04-23 NOTE — Telephone Encounter (Signed)
Patient called to check status. She wanted to know if the medication will be called in for her and if she needed to keep the appointment on Friday

## 2020-04-23 NOTE — Telephone Encounter (Signed)
Cancelled the appointment given foot better.  Sending in rx for [employer]

## 2020-04-26 ENCOUNTER — Ambulatory Visit: Payer: BC Managed Care – PPO | Admitting: Family Medicine

## 2020-05-07 ENCOUNTER — Ambulatory Visit (HOSPITAL_COMMUNITY)
Admission: EM | Admit: 2020-05-07 | Discharge: 2020-05-07 | Disposition: A | Discharge status: Home / Self Care | Payer: BC Managed Care – PPO | Attending: Internal Medicine | Admitting: Internal Medicine

## 2020-05-07 ENCOUNTER — Ambulatory Visit (INDEPENDENT_AMBULATORY_CARE_PROVIDER_SITE_OTHER): Payer: BC Managed Care – PPO

## 2020-05-07 ENCOUNTER — Other Ambulatory Visit: Payer: Self-pay

## 2020-05-07 ENCOUNTER — Ambulatory Visit (HOSPITAL_COMMUNITY): Payer: BC Managed Care – PPO

## 2020-05-07 ENCOUNTER — Encounter (HOSPITAL_COMMUNITY): Payer: Self-pay

## 2020-05-07 DIAGNOSIS — S83001A Unspecified subluxation of right patella, initial encounter: Secondary | ICD-10-CM

## 2020-05-07 DIAGNOSIS — M25561 Pain in right knee: Secondary | ICD-10-CM

## 2020-05-07 NOTE — ED Provider Notes (Signed)
[facility]    CSN: [id number] Arrival date & time: 05/07/20  1349      History   Chief Complaint Chief Complaint  Patient presents with  . Knee Pain    HPI Carol Crosby is a 59 y.o. female with past medical history of morbid obesity, hypertension, OSA presents to urgent care after fall.  Patient states she tripped over a box while at work injuring her right knee.  Patient reports significant pain with any weightbearing.  Denies any popping noise, no obvious deformity.  Patient states she has had bilateral hip replacements in the past and is due to follow-up with her orthopedist later this week.   Past Medical History:  Diagnosis Date  . Abdominal pain, right upper quadrant   . Acute sinusitis, unspecified   . Arthritis    knees, back, hip   . Esophageal reflux   . Excessive or frequent menstruation   . History of bronchitis   . Hypersomnia, unspecified   . Hypertension   . Mixed incontinence urge and stress (female)(female)   . Morbid obesity ([place])   . Obstructive sleep apnea (adult) (pediatric)   . Other chest pain   . Palpitations   . Scarlet fever   . Streptococcal sore throat   . Temporomandibular joint disorders, unspecified   . Unspecified urinary incontinence     Patient Active Problem List   Diagnosis Date Noted  . Allergic rhinitis 07/25/2019  . Skin lesion of forehead 05/05/2019  . Left lateral epicondylitis 09/27/2017  . Anterior neck pain 09/27/2017  . Skin lesion of chest wall 09/27/2017  . 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 Unilateral primary osteoarthritis, right hip 04/21/2016  . 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 Chronic insomnia 12/31/2015  . Lipoma 12/10/2014  . Right shoulder pain 12/10/2014  . Severe headache 01/23/2014  . HTN (hypertension), benign 01/09/2014  . Right knee pain 01/09/2014  . OBSTRUCTIVE SLEEP APNEA 01/14/2009  . Morbid obesity ([place]) 11/30/2007  . INCONTINENCE, MIXED,  URGE/STRESS 11/30/2007    Past Surgical History:  Procedure Laterality Date  . CESAREAN SECTION  2005  . CHOLECYSTECTOMY    . EXTREMITY CYST EXCISION  2000   Wrist  . EYE SURGERY     lasik- both eyes   . FRACTURE SURGERY     R ankle- screw for fixation   . KNEE ARTHROSCOPY Left 2007  . [place]  . [place]  . TONSILLECTOMY    . TOTAL HIP ARTHROPLASTY Left 04/21/2016   Procedure: LEFT TOTAL HIP ARTHROPLASTY ANTERIOR APPROACH;  Surgeon: Mcarthur Rossetti, MD;  Location: [facility] [place];  Service: Orthopedics;  Laterality: Left;  . TOTAL HIP ARTHROPLASTY Right 09/01/2016   Procedure: RIGHT TOTAL HIP ARTHROPLASTY ANTERIOR APPROACH and RIGHT WRIST ASPIRATION AND STEROID INJECTION;  Surgeon: Mcarthur Rossetti, MD;  Location: [facility];  Service: Orthopedics;  Laterality: Right;  . TUBAL LIGATION      OB History   No obstetric history on file.      Home Medications    Prior to Admission medications   Medication Sig Start Date End Date Taking? Authorizing Provider  doxycycline (VIBRAMYCIN) 50 MG capsule Take 50 mg by mouth daily. 04/04/19   [provider]  fluticasone (FLONASE) 50 MCG/ACT nasal spray Place 2 sprays into both nostrils daily. 07/25/19   Bedsole, Amy E, MD  metroNIDAZOLE (METROGEL) 0.75 % gel APPLY TO FACE ONCE DAILY  04/04/19   [provider]  mirabegron ER (MYRBETRIQ) 25 MG TB24 tablet Take 1 tablet (25 mg total) by mouth daily. 04/23/20   Jinny Sanders, MD    Family History Family History  Problem Relation Age of Onset  . Diabetes Father   . Migraines Mother   . Hypertension Mother   . Fibromyalgia Sister   . Colon cancer Maternal Grandmother   . Other Brother        ? Heart issue    Social History Social History   Tobacco Use  . Smoking status: Former Smoker    Quit date: 10/26/2001    Years since quitting: 18.5  . Smokeless tobacco: Never Used  Substance Use Topics  . Alcohol use: No     Alcohol/week: 0.0 standard drinks  . Drug use: No     Allergies   Erythromycin base   Review of Systems As stated in HPI otherwise negative   Physical Exam Triage Vital Signs ED Triage Vitals  Enc Vitals Group     BP 05/07/20 1505 (!) 157/81     Pulse Rate 05/07/20 1505 72     Resp 05/07/20 1505 18     Temp 05/07/20 1505 97.8 F (36.6 C)     Temp Source 05/07/20 1505 Oral     SpO2 05/07/20 1505 91 %     Weight --      Height --      Head Circumference --      Peak Flow --      Pain Score 05/07/20 1508 10     Pain Loc --      Pain Edu? --      Excl. in [place]? --    No data found.  Updated Vital Signs BP (!) 157/81 (BP Location: Left Arm)   Pulse 72   Temp 97.8 F (36.6 C) (Oral)   Resp 18   LMP  (LMP Unknown)   SpO2 91%      Physical Exam Constitutional:      Appearance: Normal appearance. She is obese.  HENT:     Head: Normocephalic and atraumatic.  Musculoskeletal:        General: No swelling or deformity.     Comments: Tenderness upon palpation of right lateral knee and tibial plateau region.  No obvious deformity.  Exam limited due to habitus  Neurological:     Mental Status: She is alert.      UC Treatments / Results  Labs (all labs ordered are listed, but only abnormal results are displayed) Labs Reviewed - No data to display  EKG   Radiology DG Knee Complete 4 Views Right  Result Date: 05/07/2020 CLINICAL DATA:  Pain following fall EXAM: RIGHT KNEE - COMPLETE 4+ VIEW COMPARISON:  None. FINDINGS: Frontal, lateral, and bilateral oblique views were obtained. No fracture, dislocation, or joint effusion. There is mild lateral patellar subluxation. There is moderate patellofemoral joint space narrowing. Other joint spaces appear unremarkable. No erosive change. IMPRESSION: Moderate narrowing patellofemoral joint. Other joint spaces appear unremarkable. No fracture, dislocation, or joint effusion. There is mild lateral patellar subluxation.  Electronically Signed   By: Lowella Grip III M.D.   On: 05/07/2020 16:05    Procedures Procedures (including critical care time)  Medications Ordered in UC Medications - No data to display  Initial Impression / Assessment and Plan / UC Course  I have reviewed the triage vital signs and the nursing notes.  Pertinent labs & imaging results that  were available during my care of the patient were reviewed by me and considered in my medical decision making (see chart for details).   Right knee displacement, mild -Secondary to fall -Unfortunately given habitus we do not have an immobilizer on site.  She declined Ace wrap as they typically do not stay on -Patient already with crutches from previous Ortho injury.  Continue using for now -Ice, gentle range of motion exercises, as needed Motrin -Keep previously scheduled appointment with Dr. Ninfa Linden this week  Final Clinical Impressions(s) / UC Diagnoses   Final diagnoses:  Patellar subluxation, right, initial encounter     Discharge Instructions     Use crutches for now as we discussed.  Motrin 600 mg every 8 hours as needed for pain.  Ice frequently 15-20 minutes at a time.  Follow-up with Dr. Ninfa Linden on Thursday for further recommendations.    ED Prescriptions    None     PDMP not reviewed this encounter.   Rudolpho Sevin, NP 05/07/20 1754

## 2020-05-07 NOTE — Discharge Instructions (Signed)
Use crutches for now as we discussed.  Motrin 600 mg every 8 hours as needed for pain.  Ice frequently 15-20 minutes at a time.  Follow-up with Dr. Ninfa Linden on Thursday for further recommendations.

## 2020-05-07 NOTE — ED Triage Notes (Signed)
Patient presents today with complaints of right knee pain she that injured at work today. Patient states she tripped over the corner of a box and fell. Patient states she had bilateral hip replacement in 2017, 2018 - rods in each leg - she wants to make sure rods are still in place correctly.

## 2020-05-08 ENCOUNTER — Ambulatory Visit: Payer: BC Managed Care – PPO | Admitting: Orthopaedic Surgery

## 2020-05-09 ENCOUNTER — Ambulatory Visit: Payer: BC Managed Care – PPO | Admitting: Orthopaedic Surgery

## 2020-08-08 MED ORDER — MIRABEGRON ER 50 MG PO TB24: 50.0000 mg | ORAL_TABLET | Freq: Every day | ORAL | 11 refills | Status: DC

## 2020-08-29 ENCOUNTER — Telehealth: Payer: Self-pay

## 2020-08-29 NOTE — Telephone Encounter (Signed)
Noted  

## 2020-08-29 NOTE — Telephone Encounter (Signed)
Patient evaluated/treated at [facility] on 08/28/20.    [facility] - Client TELEPHONE ADVICE RECORD [facility]urse Patient Name: Carol Crosby Gender: Female DOB: 1961-04-06 Age: 59 Y 97 M 21 D Return Phone Number: [phone] (Secondary) Address: [address] City/State/Zip: [place] [address] Client [facility] - Client Client Site [place] - Day Physician Eliezer Lofts - MD Contact Type Call Who Is Calling Patient / Member / Family / Caregiver Call Type Triage / Clinical Relationship To Patient Self Return Phone Number [phone] (Secondary) Chief Complaint Abdominal Pain Reason for Call Symptomatic / Request for [place] states she is having right sided abdominal pain. It is very sore and tender to the touch. [place] Translation No Nurse Assessment Nurse: Hassell Done, RN, Melanie Date/Time Eilene Ghazi Time): 08/28/2020 4:13:06 PM Confirm and document reason for call. If symptomatic, describe symptoms. ---Caller states she is having R sided abdominal pain that started today. This is above her gallbladder. No fever Does the patient have any new or worsening symptoms? ---Yes Will a triage be completed? ---Yes Related visit to physician within the last 2 weeks? ---No Does the PT have any chronic conditions? (i.e. diabetes, asthma, this includes High risk factors for pregnancy, etc.) ---No Is this a behavioral health or substance abuse call? ---No Guidelines Guideline Title Affirmed Question Affirmed Notes Nurse Date/Time Eilene Ghazi Time) Abdominal Pain - Upper [1] SEVERE pain (e.g., excruciating) AND [2] present > 1 hour Hassell Done, RN, Melanie 08/28/2020 4:14:07 PM Disp. Time Eilene Ghazi Time) Disposition Final User 08/28/2020 4:15:43 PM Go to ED Now Yes Hassell Done, RN, Donnajean Lopes Disagree/Comply Comply Caller Understands Yes PreDisposition Did not know  what to do PLEASE NOTE: All timestamps contained within this report are represented as [place] Standard Time. CONFIDENTIALTY NOTICE: This fax transmission is intended only for the addressee. It contains information that is legally privileged, confidential or otherwise protected from use or disclosure. If you are not the intended recipient, you are strictly prohibited from reviewing, disclosing, copying using or disseminating any of this information or taking any action in reliance on or regarding this information. If you have received this fax in error, please notify us immediately by telephone so that we can arrange for its return to [place]. Phone: [phone], Toll-Free: [phone], Fax: [phone] Page: 2 of 2 Call Id: [id number] Care Advice Given Per Guideline GO TO ED NOW: * You need to be seen in the Emergency Department. DRIVING: * Another adult should drive. BRING MEDICINES: NOTHING BY MOUTH: * Do not eat or drink anything for now. * Reason: condition may need surgery and general anesthesia. CARE ADVICE given per Abdominal Pain, Upper (Adult) guideline. Referrals GO TO FACILITY OTHER - SPECIFY

## 2020-09-06 ENCOUNTER — Other Ambulatory Visit: Payer: Self-pay | Admitting: Orthopedic Surgery

## 2020-09-09 NOTE — Progress Notes (Signed)
DUE TO COVID-19 ONLY ONE VISITOR IS ALLOWED TO COME WITH YOU AND STAY IN THE WAITING ROOM ONLY DURING PRE OP AND PROCEDURE DAY OF SURGERY. THE 1 VISITOR  MAY VISIT WITH YOU AFTER SURGERY IN YOUR PRIVATE ROOM DURING VISITING HOURS ONLY!  YOU NEED TO HAVE A COVID 19 TEST ON__11/10/2019 _____ @_______ , THIS TEST MUST BE DONE BEFORE SURGERY,  COVID TESTING SITE [address] [place] [address], IT IS ON THE RIGHT GOING OUT [address] APPROXIMATELY  2 MINUTES PAST [employer] ON THE RIGHT. ONCE YOUR COVID TEST IS COMPLETED,  PLEASE BEGIN THE QUARANTINE INSTRUCTIONS AS OUTLINED IN YOUR HANDOUT.                Carol Crosby  09/09/2020   Your procedure is scheduled on: 09/16/2020    Report to [facility] Main  Entrance   Report to admitting at     1024am     Call this number if you have problems the morning of surgery [phone]    Remember: Do not eat food , candy gum or mints :After Midnight. You may have clear liquids from midnight until   0920 am     CLEAR LIQUID DIET   Foods Allowed                                                                       Coffee and tea, regular and decaf                              Plain Jell-O any favor except red or purple                                            Fruit ices (not with fruit pulp)                                      Iced Popsicles                                     Carbonated beverages, regular and diet                                    Cranberry, grape and apple juices Sports drinks like Gatorade Lightly seasoned clear broth or consume(fat free) Sugar, honey syrup   _____________________________________________________________________    BRUSH YOUR TEETH MORNING OF SURGERY AND RINSE YOUR MOUTH OUT, NO CHEWING GUM CANDY OR MINTS.     Take these medicines the morning of surgery with A SIP OF WATER: none   DO NOT TAKE ANY DIABETIC MEDICATIONS DAY OF YOUR SURGERY                                You may not have any  metal on your body including hair pins and              piercings  Do not wear jewelry, make-up, lotions, powders or perfumes, deodorant             Do not wear nail polish on your fingernails.  Do not shave  48 hours prior to surgery.              Men may shave face and neck.   Do not bring valuables to the hospital. [place].  Contacts, dentures or bridgework may not be worn into surgery.  Leave suitcase in the car. After surgery it may be brought to your room.     Patients discharged the day of surgery will not be allowed to drive home. IF YOU ARE HAVING SURGERY AND GOING HOME THE SAME DAY, YOU MUST HAVE AN ADULT TO DRIVE YOU HOME AND BE WITH YOU FOR 24 HOURS. YOU MAY GO HOME BY TAXI OR UBER OR ORTHERWISE, BUT AN ADULT MUST ACCOMPANY YOU HOME AND STAY WITH YOU FOR 24 HOURS.  Name and phone number of your driver:  Special Instructions: N/A              Please read over the following fact sheets you were given: _____________________________________________________________________  [facility] - Preparing for Surgery Before surgery, you can play an important role.  Because skin is not sterile, your skin needs to be as free of germs as possible.  You can reduce the number of germs on your skin by washing with CHG (chlorahexidine gluconate) soap before surgery.  CHG is an antiseptic cleaner which kills germs and bonds with the skin to continue killing germs even after washing. Please DO NOT use if you have an allergy to CHG or antibacterial soaps.  If your skin becomes reddened/irritated stop using the CHG and inform your nurse when you arrive at Short Stay. Do not shave (including legs and underarms) for at least 48 hours prior to the first CHG shower.  You may shave your face/neck. Please follow these instructions carefully:  1.  Shower with CHG Soap the night before surgery and the  morning of Surgery.  2.  If you  choose to wash your hair, wash your hair first as usual with your  normal  shampoo.  3.  After you shampoo, rinse your hair and body thoroughly to remove the  shampoo.                           4.  Use CHG as you would any other liquid soap.  You can apply chg directly  to the skin and wash                       Gently with a scrungie or clean washcloth.  5.  Apply the CHG Soap to your body ONLY FROM THE NECK DOWN.   Do not use on face/ open                           Wound or open sores. Avoid contact with eyes, ears mouth and genitals (private parts).                       Wash face,  Genitals (private parts) with your normal soap.             6.  Wash thoroughly, paying special attention to the area where your surgery  will be performed.  7.  Thoroughly rinse your body with warm water from the neck down.  8.  DO NOT shower/wash with your normal soap after using and rinsing off  the CHG Soap.                9.  Pat yourself dry with a clean towel.            10.  Wear clean pajamas.            11.  Place clean sheets on your bed the night of your first shower and do not  sleep with pets. Day of Surgery : Do not apply any lotions/deodorants the morning of surgery.  Please wear clean clothes to the hospital/surgery center.  FAILURE TO FOLLOW THESE INSTRUCTIONS MAY RESULT IN THE CANCELLATION OF YOUR SURGERY PATIENT SIGNATURE_________________________________  NURSE SIGNATURE__________________________________  ________________________________________________________________________

## 2020-09-10 ENCOUNTER — Encounter (HOSPITAL_COMMUNITY): Payer: Self-pay

## 2020-09-10 ENCOUNTER — Other Ambulatory Visit: Payer: Self-pay

## 2020-09-10 ENCOUNTER — Encounter (HOSPITAL_COMMUNITY)
Admission: RE | Admit: 2020-09-10 | Discharge: 2020-09-10 | Disposition: A | Discharge status: Home / Self Care | Payer: No Typology Code available for payment source | Source: Ambulatory Visit | Attending: Orthopedic Surgery | Admitting: Orthopedic Surgery

## 2020-09-10 DIAGNOSIS — Z01818 Encounter for other preprocedural examination: Secondary | ICD-10-CM | POA: Insufficient documentation

## 2020-09-10 HISTORY — DX: Other specified postprocedural states: Z98.890

## 2020-09-10 HISTORY — DX: Malignant (primary) neoplasm, unspecified: C80.1

## 2020-09-10 HISTORY — DX: Other specified postprocedural states: R11.2

## 2020-09-10 LAB — CBC
HCT: 45.3 % (ref 36.0–46.0)
Hemoglobin: 13.8 g/dL (ref 12.0–15.0)
MCH: 29.9 pg (ref 26.0–34.0)
MCHC: 30.5 g/dL (ref 30.0–36.0)
MCV: 98.1 fL (ref 80.0–100.0)
Platelets: 245 10*3/uL (ref 150–400)
RBC: 4.62 MIL/uL (ref 3.87–5.11)
RDW: 14.2 % (ref 11.5–15.5)
WBC: 6.7 10*3/uL (ref 4.0–10.5)
nRBC: 0 % (ref 0.0–0.2)

## 2020-09-10 LAB — BASIC METABOLIC PANEL
Anion gap: 8 (ref 5–15)
BUN: 14 mg/dL (ref 6–20)
CO2: 29 mmol/L (ref 22–32)
Calcium: 9 mg/dL (ref 8.9–10.3)
Chloride: 104 mmol/L (ref 98–111)
Creatinine, Ser: 0.59 mg/dL (ref 0.44–1.00)
GFR, Estimated: >60 mL/min (ref >60)
Glucose, Bld: 100 mg/dL — ABNORMAL HIGH (ref 70–99)
Potassium: 4.3 mmol/L (ref 3.5–5.1)
Sodium: 141 mmol/L (ref 135–145)

## 2020-09-10 NOTE — Progress Notes (Signed)
NO SOLID FOOD AFTER MIDNIGHT THE NIGHT PRIOR TO SURGERY. NOTHING BY MOUTH EXCEPT CLEAR LIQUIDS UNTIL     0920am . PLEASE FINISH ENSURE DRINK PER SURGEON ORDER  WHICH NEEDS TO BE COMPLETED AT .[id number]

## 2020-09-10 NOTE — Progress Notes (Signed)
Called and [facility] for Surgery Scheduler in regards to consent form.  Patient reports at preop taht consent is supposed to read for bilateral knee scopes .  OR schedule and consent form states for right knee arthroscopy.

## 2020-09-11 ENCOUNTER — Other Ambulatory Visit: Payer: Self-pay | Admitting: Orthopedic Surgery

## 2020-09-11 NOTE — Progress Notes (Signed)
Final EKG done 09/10/20 in epic.

## 2020-09-12 ENCOUNTER — Other Ambulatory Visit (HOSPITAL_COMMUNITY)
Admission: RE | Admit: 2020-09-12 | Discharge: 2020-09-12 | Disposition: A | Discharge status: Home / Self Care | Payer: BC Managed Care – PPO | Source: Ambulatory Visit | Attending: Orthopedic Surgery | Admitting: Orthopedic Surgery

## 2020-09-12 DIAGNOSIS — Z20822 Contact with and (suspected) exposure to covid-19: Secondary | ICD-10-CM | POA: Diagnosis not present

## 2020-09-12 DIAGNOSIS — Z01812 Encounter for preprocedural laboratory examination: Secondary | ICD-10-CM | POA: Diagnosis not present

## 2020-09-13 DIAGNOSIS — S83242A Other tear of medial meniscus, current injury, left knee, initial encounter: Secondary | ICD-10-CM | POA: Diagnosis present

## 2020-09-13 DIAGNOSIS — S83241A Other tear of medial meniscus, current injury, right knee, initial encounter: Secondary | ICD-10-CM | POA: Diagnosis present

## 2020-09-13 LAB — SARS CORONAVIRUS 2 (TAT 6-24 HRS): SARS Coronavirus 2: NEGATIVE

## 2020-09-13 NOTE — Progress Notes (Signed)
Called patient to ask her to arrive at 0910 on 09/16/20 for surgery. She is very anxious that the order for consent has not been addressed to include bilateral knee arthroscopies. Writer lets her know this can be addressed the morning of surgery by Dr Mayer Camel.

## 2020-09-13 NOTE — H&P (Signed)
Carol Crosby is an 59 y.o. female.   Chief Complaint: Bilateral knee pain  HPI: Carol Crosby is here today for follow-up on bilateral knee pain.  At her last appointment on 07/30/20 she received a right knee steroid injection.  She states that she had maximum 30% relief over the first day or so.  She states that she had no sustained relief.  The right knee injection hurt enough at the time of the injection that she elected not to receive a left knee steroid injection.  She continues to have significant pain in bilateral knees.  She does have MRI showing meniscal tearing in bilateral knees.  She also has significant chondromalacia left greater than right knee.  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  Past Medical History:  Diagnosis Date  . Abdominal pain, right upper quadrant   . Acute sinusitis, unspecified   . Arthritis    knees, back, hip   . Cancer (HCC)    hx of skin cancer   . Esophageal reflux    pt denies on 09/10/20   . Excessive or frequent menstruation   . History of bronchitis   . Hypersomnia, unspecified   . Hypertension    pt denies at preop visit of 09/10/20   . Mixed incontinence urge and stress (female)(female)   . Morbid obesity ([place])   . Obstructive sleep apnea (adult) (pediatric)    cpap machine   . Other chest pain   . Palpitations   . PONV (postoperative nausea and vomiting)   . Scarlet fever   . Streptococcal sore throat   . Temporomandibular joint disorders, unspecified   . Unspecified urinary incontinence     Past Surgical History:  Procedure Laterality Date  . CESAREAN SECTION  2005  . CHOLECYSTECTOMY    . EXTREMITY CYST EXCISION  2000   Wrist  . EYE SURGERY     lasik- both eyes   . FRACTURE SURGERY     R ankle- screw for fixation   . KNEE ARTHROSCOPY Left 2007  . [place]  . [place]  . TONSILLECTOMY    . TOTAL HIP ARTHROPLASTY Left 04/21/2016    Procedure: LEFT TOTAL HIP ARTHROPLASTY ANTERIOR APPROACH;  Surgeon: Mcarthur Rossetti, MD;  Location: [facility];  Service: Orthopedics;  Laterality: Left;  . TOTAL HIP ARTHROPLASTY Right 09/01/2016   Procedure: RIGHT TOTAL HIP ARTHROPLASTY ANTERIOR APPROACH and RIGHT WRIST ASPIRATION AND STEROID INJECTION;  Surgeon: Mcarthur Rossetti, MD;  Location: [facility];  Service: Orthopedics;  Laterality: Right;  . TUBAL LIGATION      Family History  Problem Relation Age of Onset  . Diabetes Father   . Migraines Mother   . Hypertension Mother   . Fibromyalgia Sister   . Colon cancer Maternal Grandmother   . Other Brother        ? Heart issue   Social History:  reports that she quit smoking about 18 years ago. She has never used smokeless tobacco. She reports that she does not drink alcohol and does not use drugs.  Allergies:  Allergies  Allergen Reactions  . Erythromycin Base Nausea And Vomiting    No medications prior to admission.    Results for orders placed or performed during the hospital encounter of 09/12/20 (from the past 48 hour(s))  SARS CORONAVIRUS 2 (TAT 6-24 HRS) Nasopharyngeal Nasopharyngeal Swab     Status: None  Collection Time: 09/12/20  2:43 PM   Specimen: Nasopharyngeal Swab  Result Value Ref Range   SARS Coronavirus 2 NEGATIVE NEGATIVE    Comment: (NOTE) SARS-CoV-2 target nucleic acids are NOT DETECTED.  The SARS-CoV-2 RNA is generally detectable in upper and lower respiratory specimens during the acute phase of infection. Negative results do not preclude SARS-CoV-2 infection, do not rule out co-infections with other pathogens, and should not be used as the sole basis for treatment or other patient management decisions. Negative results must be combined with clinical observations, patient history, and epidemiological information. The expected result is Negative.  Fact Sheet for Patients: SugarRoll.be  Fact Sheet for Healthcare  Providers: https://www.woods-mathews.com/  This test is not yet approved or cleared by the [place] FDA and  has been authorized for detection and/or diagnosis of SARS-CoV-2 by FDA under an Emergency Use Authorization (EUA). This EUA will remain  in effect (meaning this test can be used) for the duration of the COVID-19 declaration under Se ction 564(b)(1) of the Act, 21 U.S.C. section 360bbb-3(b)(1), unless the authorization is terminated or revoked sooner.  Performed at [facility], [place] [address], [place], [place] [address]    No results found.  Review of Systems  Constitutional: Negative.   HENT: Negative.   Eyes: Negative.   Respiratory: Negative.   Cardiovascular: Negative.   Gastrointestinal: Negative.   Genitourinary: Positive for frequency and urgency.  Musculoskeletal: Positive for arthralgias.  Skin: Negative.   Allergic/Immunologic: Negative.   Neurological: Negative.   Hematological: Negative.   Psychiatric/Behavioral: Negative.     There were no vitals taken for this visit. Physical Exam Constitutional:      Appearance: Normal appearance. She is obese.  HENT:     Head: Normocephalic and atraumatic.     Nose: Nose normal.  Eyes:     Pupils: Pupils are equal, round, and reactive to light.  Cardiovascular:     Pulses: Normal pulses.  Pulmonary:     Effort: Pulmonary effort is normal.  Musculoskeletal:        General: Normal range of motion.     Cervical back: Normal range of motion and neck supple.     Comments: She has significant pain with palpation over the medial joint line of bilateral knees.  Minimal lateral pain.  Patient's range of motion is limited due to her body habitus.  Her calves are soft and nontender.  Skin:    General: Skin is warm and dry.  Neurological:     General: No focal deficit present.     Mental Status: She is alert and oriented to person, place, and time. Mental status is at baseline.  Psychiatric:         Mood and Affect: Mood normal.        Behavior: Behavior normal.        Thought Content: Thought content normal.      Assessment/Plan Assess: Symptomatic right knee medial meniscal tear with only 30% relief from recent steroid injection                 Left knee medial meniscal tear/degeneration  Plan: Treatment options are discussed with the patient.  Today, the patient does wish to proceed with a right and left knee arthroscopy.  Due to the lack of relief from the steroid injection and the extensor issues in both the right and left knee we have told the patient that she has less than a 50% chance of the surgery helping  to relieve her symptoms.  Again she is made very aware that she has less than a 5% chance of having relief following her surgery.  She wishes to proceed and a posting slip is completed.  We are formally asking Worker's Comp. for permission to proceed with both a right and left knee arthroscopy.  In the interim, the patient is given a work note for normal work without restrictions.  She is to call with any issues.  No medications were asked for or given today.  Joanell Rising, PA-C 09/13/2020, 1:59 PM

## 2020-09-15 MED ORDER — DEXTROSE 5 % IV SOLN
3.0000 g | INTRAVENOUS | Status: AC
  Administered 2020-09-16: 3 g via INTRAVENOUS
  Filled 2020-09-15: qty 3

## 2020-09-16 ENCOUNTER — Encounter (HOSPITAL_COMMUNITY): Payer: Self-pay | Admitting: Orthopedic Surgery

## 2020-09-16 ENCOUNTER — Ambulatory Visit (HOSPITAL_COMMUNITY): Payer: No Typology Code available for payment source | Admitting: Anesthesiology

## 2020-09-16 ENCOUNTER — Encounter (HOSPITAL_COMMUNITY)
Admission: RE | Disposition: A | Discharge status: Home / Self Care | Payer: Self-pay | Source: Other Acute Inpatient Hospital | Attending: Orthopedic Surgery

## 2020-09-16 ENCOUNTER — Other Ambulatory Visit: Payer: Self-pay

## 2020-09-16 ENCOUNTER — Ambulatory Visit (HOSPITAL_COMMUNITY)
Admission: RE | Admit: 2020-09-16 | Discharge: 2020-09-16 | Disposition: A | Discharge status: Home / Self Care | Payer: No Typology Code available for payment source | Source: Other Acute Inpatient Hospital | Attending: Orthopedic Surgery | Admitting: Orthopedic Surgery

## 2020-09-16 ENCOUNTER — Telehealth (HOSPITAL_COMMUNITY): Payer: Self-pay | Admitting: *Deleted

## 2020-09-16 DIAGNOSIS — S83231A Complex tear of medial meniscus, current injury, right knee, initial encounter: Secondary | ICD-10-CM | POA: Diagnosis present

## 2020-09-16 DIAGNOSIS — Y92211 Elementary school as the place of occurrence of the external cause: Secondary | ICD-10-CM | POA: Insufficient documentation

## 2020-09-16 DIAGNOSIS — M94262 Chondromalacia, left knee: Secondary | ICD-10-CM | POA: Diagnosis not present

## 2020-09-16 DIAGNOSIS — X58XXXA Exposure to other specified factors, initial encounter: Secondary | ICD-10-CM | POA: Diagnosis not present

## 2020-09-16 DIAGNOSIS — Y990 Civilian activity done for income or pay: Secondary | ICD-10-CM | POA: Insufficient documentation

## 2020-09-16 DIAGNOSIS — S83241A Other tear of medial meniscus, current injury, right knee, initial encounter: Secondary | ICD-10-CM

## 2020-09-16 DIAGNOSIS — Z96643 Presence of artificial hip joint, bilateral: Secondary | ICD-10-CM | POA: Insufficient documentation

## 2020-09-16 DIAGNOSIS — M94261 Chondromalacia, right knee: Secondary | ICD-10-CM | POA: Insufficient documentation

## 2020-09-16 DIAGNOSIS — Z87891 Personal history of nicotine dependence: Secondary | ICD-10-CM | POA: Insufficient documentation

## 2020-09-16 DIAGNOSIS — Z85828 Personal history of other malignant neoplasm of skin: Secondary | ICD-10-CM | POA: Insufficient documentation

## 2020-09-16 DIAGNOSIS — S83242A Other tear of medial meniscus, current injury, left knee, initial encounter: Secondary | ICD-10-CM | POA: Insufficient documentation

## 2020-09-16 HISTORY — PX: KNEE ARTHROSCOPY: SHX127

## 2020-09-16 SURGERY — ARTHROSCOPY, KNEE
Anesthesia: General | Site: Knee | Laterality: Bilateral

## 2020-09-16 MED ORDER — DEXAMETHASONE SODIUM PHOSPHATE 10 MG/ML IJ SOLN
INTRAMUSCULAR | Status: DC | PRN
  Administered 2020-09-16: 10 mg via INTRAVENOUS

## 2020-09-16 MED ORDER — BUPIVACAINE-EPINEPHRINE 0.5% -1:200000 IJ SOLN
INTRAMUSCULAR | Status: AC
  Filled 2020-09-16: qty 1

## 2020-09-16 MED ORDER — MIDAZOLAM HCL 5 MG/5ML IJ SOLN
INTRAMUSCULAR | Status: DC | PRN
  Administered 2020-09-16: 2 mg via INTRAVENOUS

## 2020-09-16 MED ORDER — FENTANYL CITRATE (PF) 100 MCG/2ML IJ SOLN
25.0000 ug | INTRAMUSCULAR | Status: DC | PRN
  Administered 2020-09-16 (×2): 25 ug via INTRAVENOUS

## 2020-09-16 MED ORDER — CHLORHEXIDINE GLUCONATE 0.12 % MT SOLN
15.0000 mL | Freq: Once | OROMUCOSAL | Status: AC
  Administered 2020-09-16: 15 mL via OROMUCOSAL

## 2020-09-16 MED ORDER — ONDANSETRON HCL 4 MG/2ML IJ SOLN
INTRAMUSCULAR | Status: AC
  Filled 2020-09-16: qty 2

## 2020-09-16 MED ORDER — LACTATED RINGERS IV SOLN: INTRAVENOUS | Status: DC

## 2020-09-16 MED ORDER — EPINEPHRINE PF 1 MG/ML IJ SOLN
INTRAMUSCULAR | Status: AC
  Filled 2020-09-16: qty 2

## 2020-09-16 MED ORDER — SCOPOLAMINE 1 MG/3DAYS TD PT72
1.0000 | MEDICATED_PATCH | Freq: Once | TRANSDERMAL | Status: DC
  Administered 2020-09-16: 1.5 mg via TRANSDERMAL

## 2020-09-16 MED ORDER — ONDANSETRON HCL 4 MG/2ML IJ SOLN: 4.0000 mg | Freq: Four times a day (QID) | INTRAMUSCULAR | Status: DC | PRN

## 2020-09-16 MED ORDER — ONDANSETRON HCL 4 MG/2ML IJ SOLN
INTRAMUSCULAR | Status: DC | PRN
  Administered 2020-09-16: 4 mg via INTRAVENOUS

## 2020-09-16 MED ORDER — OXYCODONE-ACETAMINOPHEN 5-325 MG PO TABS
ORAL_TABLET | ORAL | Status: AC
  Administered 2020-09-16: 1 via ORAL
  Filled 2020-09-16: qty 1

## 2020-09-16 MED ORDER — OXYCODONE-ACETAMINOPHEN 5-325 MG PO TABS: 1.0000 | ORAL_TABLET | Freq: Once | ORAL | Status: AC

## 2020-09-16 MED ORDER — KETOROLAC TROMETHAMINE 30 MG/ML IJ SOLN
INTRAMUSCULAR | Status: AC
  Filled 2020-09-16: qty 1

## 2020-09-16 MED ORDER — EPHEDRINE SULFATE-NACL 50-0.9 MG/10ML-% IV SOSY
PREFILLED_SYRINGE | INTRAVENOUS | Status: DC | PRN
  Administered 2020-09-16: 10 mg via INTRAVENOUS

## 2020-09-16 MED ORDER — SODIUM CHLORIDE 0.9 % IR SOLN
Status: DC | PRN
  Administered 2020-09-16: 12000 mL

## 2020-09-16 MED ORDER — KETOROLAC TROMETHAMINE 30 MG/ML IJ SOLN
INTRAMUSCULAR | Status: DC | PRN
  Administered 2020-09-16: 30 mg via INTRAVENOUS

## 2020-09-16 MED ORDER — FENTANYL CITRATE (PF) 100 MCG/2ML IJ SOLN
INTRAMUSCULAR | Status: DC | PRN
  Administered 2020-09-16 (×8): 25 ug via INTRAVENOUS

## 2020-09-16 MED ORDER — ONDANSETRON HCL 4 MG/2ML IJ SOLN: 4.0000 mg | Freq: Once | INTRAMUSCULAR | Status: DC | PRN

## 2020-09-16 MED ORDER — FENTANYL CITRATE (PF) 100 MCG/2ML IJ SOLN
INTRAMUSCULAR | Status: AC
  Administered 2020-09-16: 50 ug via INTRAVENOUS
  Filled 2020-09-16: qty 2

## 2020-09-16 MED ORDER — PROPOFOL 10 MG/ML IV BOLUS
INTRAVENOUS | Status: DC | PRN
  Administered 2020-09-16: 200 mg via INTRAVENOUS

## 2020-09-16 MED ORDER — FENTANYL CITRATE (PF) 100 MCG/2ML IJ SOLN
INTRAMUSCULAR | Status: AC
  Filled 2020-09-16: qty 2

## 2020-09-16 MED ORDER — DEXAMETHASONE SODIUM PHOSPHATE 10 MG/ML IJ SOLN
INTRAMUSCULAR | Status: AC
  Filled 2020-09-16: qty 1

## 2020-09-16 MED ORDER — BUPIVACAINE-EPINEPHRINE 0.5% -1:200000 IJ SOLN
INTRAMUSCULAR | Status: DC | PRN
  Administered 2020-09-16: 30 mL

## 2020-09-16 MED ORDER — HYDROMORPHONE HCL 1 MG/ML IJ SOLN
0.2500 mg | INTRAMUSCULAR | Status: DC | PRN
  Administered 2020-09-16: 0.25 mg via INTRAVENOUS

## 2020-09-16 MED ORDER — OXYCODONE-ACETAMINOPHEN 5-325 MG PO TABS
1.0000 | ORAL_TABLET | ORAL | 0 refills | Status: DC | PRN
Start: 2020-09-16 — End: 2021-01-21

## 2020-09-16 MED ORDER — EPINEPHRINE PF 1 MG/ML IJ SOLN
INTRAMUSCULAR | Status: DC | PRN
  Administered 2020-09-16: 4 mg

## 2020-09-16 MED ORDER — ONDANSETRON HCL 4 MG PO TABS
4.0000 mg | ORAL_TABLET | Freq: Four times a day (QID) | ORAL | Status: DC | PRN
  Filled 2020-09-16: qty 1

## 2020-09-16 MED ORDER — PROPOFOL 10 MG/ML IV BOLUS
INTRAVENOUS | Status: AC
  Filled 2020-09-16: qty 20

## 2020-09-16 MED ORDER — METOCLOPRAMIDE HCL 5 MG PO TABS
5.0000 mg | ORAL_TABLET | Freq: Three times a day (TID) | ORAL | Status: DC | PRN
  Filled 2020-09-16: qty 2

## 2020-09-16 MED ORDER — EPINEPHRINE PF 1 MG/ML IJ SOLN
INTRAMUSCULAR | Status: AC
  Filled 2020-09-16: qty 3

## 2020-09-16 MED ORDER — SCOPOLAMINE 1 MG/3DAYS TD PT72
MEDICATED_PATCH | TRANSDERMAL | Status: AC
  Filled 2020-09-16: qty 1

## 2020-09-16 MED ORDER — LIDOCAINE 2% (20 MG/ML) 5 ML SYRINGE
INTRAMUSCULAR | Status: DC | PRN
  Administered 2020-09-16: 100 mg via INTRAVENOUS

## 2020-09-16 MED ORDER — METOCLOPRAMIDE HCL 5 MG/ML IJ SOLN: 5.0000 mg | Freq: Three times a day (TID) | INTRAMUSCULAR | Status: DC | PRN

## 2020-09-16 MED ORDER — EPHEDRINE 5 MG/ML INJ
INTRAVENOUS | Status: AC
  Filled 2020-09-16: qty 10

## 2020-09-16 MED ORDER — ORAL CARE MOUTH RINSE: 15.0000 mL | Freq: Once | OROMUCOSAL | Status: AC

## 2020-09-16 MED ORDER — HYDROMORPHONE HCL 1 MG/ML IJ SOLN
INTRAMUSCULAR | Status: AC
  Administered 2020-09-16: 0.25 mg via INTRAVENOUS
  Filled 2020-09-16: qty 1

## 2020-09-16 MED ORDER — MIDAZOLAM HCL 2 MG/2ML IJ SOLN
INTRAMUSCULAR | Status: AC
  Filled 2020-09-16: qty 2

## 2020-09-16 SURGICAL SUPPLY — 38 items
APL PRP STRL LF DISP 70% ISPRP (MISCELLANEOUS) ×1
BAG SPEC THK2 15X12 ZIP CLS (MISCELLANEOUS) ×1
BAG ZIPLOCK 12X15 (MISCELLANEOUS) ×2 IMPLANT
BLADE EXCALIBUR 5.0X13 (MISCELLANEOUS) ×2 IMPLANT
BLADE SURG SZ11 CARB STEEL (BLADE) IMPLANT
BNDG ELASTIC 6X5.8 VLCR STR LF (GAUZE/BANDAGES/DRESSINGS) ×4 IMPLANT
CHLORAPREP W/TINT 26 (MISCELLANEOUS) ×2 IMPLANT
COVER SURGICAL LIGHT HANDLE (MISCELLANEOUS) ×2 IMPLANT
COVER WAND RF STERILE (DRAPES) IMPLANT
CUFF TOURN SGL QUICK 34 (TOURNIQUET CUFF) ×2
CUFF TRNQT CYL 34X4.125X (TOURNIQUET CUFF) ×1 IMPLANT
DISSECTOR  3.8MM X 13CM (MISCELLANEOUS) ×2
DISSECTOR 3.8MM X 13CM (MISCELLANEOUS) ×1 IMPLANT
DRAPE SHEET LG 3/4 BI-LAMINATE (DRAPES) ×2 IMPLANT
DRSG PAD ABDOMINAL 8X10 ST (GAUZE/BANDAGES/DRESSINGS) ×4 IMPLANT
ELECT REM PT RETURN 15FT ADLT (MISCELLANEOUS) ×2 IMPLANT
GAUZE SPONGE 4X4 12PLY STRL (GAUZE/BANDAGES/DRESSINGS) ×4 IMPLANT
GAUZE XEROFORM 1X8 LF (GAUZE/BANDAGES/DRESSINGS) ×4 IMPLANT
GLOVE BIO SURGEON STRL SZ7 (GLOVE) ×2 IMPLANT
GLOVE BIO SURGEON STRL SZ7.5 (GLOVE) ×2 IMPLANT
GLOVE BIOGEL PI IND STRL 7.0 (GLOVE) ×1 IMPLANT
GLOVE BIOGEL PI IND STRL 8 (GLOVE) ×1 IMPLANT
GLOVE BIOGEL PI INDICATOR 7.0 (GLOVE) ×1
GLOVE BIOGEL PI INDICATOR 8 (GLOVE) ×1
KIT BASIN OR (CUSTOM PROCEDURE TRAY) IMPLANT
KIT TURNOVER KIT A (KITS) IMPLANT
MANIFOLD NEPTUNE II (INSTRUMENTS) ×2 IMPLANT
NDL SAFETY ECLIPSE 18X1.5 (NEEDLE) ×1 IMPLANT
NEEDLE HYPO 18GX1.5 SHARP (NEEDLE) ×2
NEEDLE HYPO 22GX1.5 SAFETY (NEEDLE) ×2 IMPLANT
NEEDLE SPNL 18GX3.5 QUINCKE PK (NEEDLE) ×2 IMPLANT
PACK ARTHROSCOPY WL (CUSTOM PROCEDURE TRAY) ×2 IMPLANT
PADDING CAST COTTON 6X4 STRL (CAST SUPPLIES) ×4 IMPLANT
PENCIL SMOKE EVACUATOR (MISCELLANEOUS) IMPLANT
PROTECTOR NERVE ULNAR (MISCELLANEOUS) ×2 IMPLANT
SUT ETHILON 3 0 PS 1 (SUTURE) ×2 IMPLANT
SYR 20ML LL LF (SYRINGE) ×2 IMPLANT
TUBING ARTHROSCOPY IRRIG 16FT (MISCELLANEOUS) ×2 IMPLANT

## 2020-09-16 NOTE — Transfer of Care (Signed)
Immediate Anesthesia Transfer of Care Note  Patient: Carol Crosby  Procedure(s) Performed: KNEE ARTHROSCOPY (Bilateral Knee)  Patient Location: PACU  Anesthesia Type:General  Level of Consciousness: awake  Airway & Oxygen Therapy: Patient Spontanous Breathing and Patient connected to face mask oxygen  Post-op Assessment: Report given to RN and Post -op Vital signs reviewed and stable  Post vital signs: Reviewed and stable  Last Vitals:  Vitals Value Taken Time  BP 174/80 09/16/20 1232  Temp    Pulse 90 09/16/20 1233  Resp 10 09/16/20 1233  SpO2 98 % 09/16/20 1233  Vitals shown include unvalidated device data.  Last Pain:  Vitals:   09/16/20 0936  TempSrc: Oral  PainSc:       Patients Stated Pain Goal: 4 (79/15/04 1364)  Complications: No complications documented.

## 2020-09-16 NOTE — Anesthesia Preprocedure Evaluation (Signed)
Anesthesia Evaluation  Patient identified by MRN, date of birth, ID band Patient awake    Reviewed: Allergy & Precautions, NPO status , Patient's Chart, lab work & pertinent test results  History of Anesthesia Complications (+) PONV and history of anesthetic complications  Airway Mallampati: II  TM Distance: >3 FB Neck ROM: Full    Dental no notable dental hx. (+) Teeth Intact   Pulmonary sleep apnea and Continuous Positive Airway Pressure Ventilation , former smoker,    Pulmonary exam normal breath sounds clear to auscultation       Cardiovascular hypertension, Pt. on medications Normal cardiovascular exam Rhythm:Regular Rate:Normal     Neuro/Psych  Headaches, negative psych ROS   GI/Hepatic Neg liver ROS, GERD  Medicated and Controlled,  Endo/Other  Morbid obesity  Renal/GU negative Renal ROS  negative genitourinary   Musculoskeletal  (+) Arthritis , Osteoarthritis,  Bilateral medial and lateral meniscus tears   Abdominal (+) + obese,   Peds  Hematology negative hematology ROS (+)   Anesthesia Other Findings   Reproductive/Obstetrics                             Anesthesia Physical Anesthesia Plan  ASA: III  Anesthesia Plan: General   Post-op Pain Management:    Induction: Intravenous  PONV Risk Score and Plan: 4 or greater and Scopolamine patch - Pre-op, Midazolam and Ondansetron  Airway Management Planned: LMA and Oral ETT  Additional Equipment:   Intra-op Plan:   Post-operative Plan: Extubation in OR  Informed Consent: I have reviewed the patients History and Physical, chart, labs and discussed the procedure including the risks, benefits and alternatives for the proposed anesthesia with the patient or authorized representative who has indicated his/her understanding and acceptance.     Dental advisory given  Plan Discussed with: CRNA and  Anesthesiologist  Anesthesia Plan Comments:         Anesthesia Quick Evaluation

## 2020-09-16 NOTE — Discharge Instructions (Signed)
Meniscus Tear Surgery, Care After This sheet gives you information about how to care for yourself after your procedure. Your health care provider may also give you more specific instructions. If you have problems or questions, contact your health care provider. What can I expect after the procedure? After your procedure, it is common to have:  Pain.  Swelling.  Stiffness. Follow these instructions at home: Medicines  Take over-the-counter and prescription medicines only as told by your health care provider.  Ask your health care provider if the medicine prescribed to you: ? Requires you to avoid driving or using heavy machinery. ? Can cause constipation. You may need to take actions to prevent or treat constipation, such as:  Drink enough fluid to keep your urine pale yellow.  Take over-the-counter or prescription medicines.  Eat foods that are high in fiber, such as beans, whole grains, and fresh fruits and vegetables.  Limit foods that are high in fat and processed sugars, such as fried or sweet foods.  If you were prescribed an antibiotic medicine, take it as told by your health care provider. Do not stop taking the antibiotic even if you start to feel better. If you have a brace:  Wear the brace as told by your health care provider. Remove it only as told by your health care provider.  Loosen the brace if your toes tingle, become numb, or turn cold and blue.  Keep the brace clean.  If the brace is not waterproof: ? Do not let it get wet. ? Cover it with a watertight covering when you take a bath or a shower. Incision care   Follow instructions from your health care provider about how to take care of your incisions. Make sure you: ? Wash your hands with soap and water before and after you change your bandage (dressing). If soap and water are not available, use hand sanitizer. ? Change your dressing as told by your health care provider. ? Leave stitches (sutures), skin  glue, or adhesive strips in place. These skin closures may need to stay in place for 2 weeks or longer. If adhesive strip edges start to loosen and curl up, you may trim the loose edges. Do not remove adhesive strips completely unless your health care provider tells you to do that.  Check your incision areas every day for signs of infection. Check for: ? More redness, swelling, or pain. ? Fluid or blood. ? Warmth. ? Pus or a bad smell.  Do not take baths, swim, or use a hot tub until your health care provider approves. Ask your health care provider if you may take showers. You may only be allowed to take sponge baths. Managing pain, stiffness, and swelling   If directed, put ice on the affected area. ? If you have a removable brace, remove it as told by your health care provider. ? Put ice in a plastic bag. ? Place a towel between your skin and the bag. ? Leave the ice on for 20 minutes, 2-3 times a day.  Move your foot and toes often to reduce stiffness and swelling.  Raise (elevate) your knee above the level of your heart while you are sitting or lying down. Driving  Do not drive for 24 hours if you were given a sedative during your procedure.  Ask your health care provider when it is safe to drive if you have a brace. Activity  Return to your normal activities as told by your health care provider. Ask  your health care provider what activities are safe for you.  Do exercises as told by your health care provider. Safety  Do not use the injured limb to support your body weight until your health care provider says that you can. Use crutches or a walker as told by your health care provider. This is important.  Do not lift anything that is heavier than 10 lb (4.5 kg), or the limit that you are told, until your health care provider says that it is safe. General instructions  Do not use any products that contain nicotine or tobacco, such as cigarettes, e-cigarettes, and chewing  tobacco. These can delay healing after surgery. If you need help quitting, ask your health care provider.  Keep all follow-up visits as told by your health care provider. This is important. Contact a health care provider if:  You have chills or a fever.  You have more redness, swelling, or pain around your incision.  You have fluid or blood coming from your incision.  Your incision feels warm to the touch.  You have pus or a bad smell coming from your incision. Get help right away if:  You have severe pain that is not relieved with medicine. Summary  After your procedure, it is common to have pain, swelling, and stiffness.  Take over-the-counter and prescription medicines, including antibiotic medicine, only as told by your health care provider.  Do not use the injured limb to support your body weight until your health care provider says that you can. Use crutches or a walker as told by your health care provider. This is important.  Contact a health care provider if you have chills or a fever, or you have more redness, swelling, or pain around your incision.  Get help right away if you have severe pain that is not relieved with medicine. This information is not intended to replace advice given to you by your health care provider. Make sure you discuss any questions you have with your health care provider. Document Revised: 02/03/2019 Document Reviewed: 07/11/2018 Elsevier Patient Education  [place].

## 2020-09-16 NOTE — Interval H&P Note (Signed)
History and Physical Interval Note:  09/16/2020 9:29 AM  Carol Crosby  has presented today for surgery, with the diagnosis of LEFT AND RIGHT MEDIAL MENISUS TEAR AND LATERAL MENISCUS TEAR AND CHONDROMALACIA.  The various methods of treatment have been discussed with the patient and family. After consideration of risks, benefits and other options for treatment, the patient has consented to  Procedure(s): KNEE ARTHROSCOPY (Bilateral) as a surgical intervention.  The patient's history has been reviewed, patient examined, no change in status, stable for surgery.  I have reviewed the patient's chart and labs.  Questions were answered to the patient's satisfaction.     Kerin Salen

## 2020-09-16 NOTE — Anesthesia Postprocedure Evaluation (Signed)
Anesthesia Post Note  Patient: Carol Crosby  Procedure(s) Performed: KNEE ARTHROSCOPY (Bilateral Knee)     Patient location during evaluation: PACU Anesthesia Type: General Level of consciousness: awake and alert and oriented Pain management: pain level controlled Vital Signs Assessment: post-procedure vital signs reviewed and stable Respiratory status: spontaneous breathing, nonlabored ventilation and respiratory function stable Cardiovascular status: blood pressure returned to baseline and stable Postop Assessment: no apparent nausea or vomiting Anesthetic complications: no   No complications documented.  Last Vitals:  Vitals:   09/16/20 1300 09/16/20 1315  BP: (!) 161/76 (!) 154/76  Pulse: 93 97  Resp: 18 19  Temp:    SpO2: 97% 97%    Last Pain:  Vitals:   09/16/20 1315  TempSrc:   PainSc: 6                  Denissa Cozart A.

## 2020-09-16 NOTE — Anesthesia Procedure Notes (Signed)
Procedure Name: LMA Insertion Date/Time: 09/16/2020 11:16 AM Performed by: Sharlette Dense, CRNA Patient Re-evaluated:Patient Re-evaluated prior to induction Oxygen Delivery Method: Circle system utilized Preoxygenation: Pre-oxygenation with 100% oxygen Induction Type: IV induction LMA: LMA inserted LMA Size: 4.0 Number of attempts: 1 Placement Confirmation: positive ETCO2 and breath sounds checked- equal and bilateral Tube secured with: Tape Dental Injury: Teeth and Oropharynx as per pre-operative assessment

## 2020-09-16 NOTE — Op Note (Signed)
Pre-Op Dx: Bilateral medial meniscal tears, bilateral knee chondromalacia  Postop Dx: Same  Procedure: Bilateral knee partial medial meniscectomy syndrome chondromalacia that is primarily along the medial femoral condyle and trochlea.  Surgeon: Kathalene Frames. Mayer Camel M.D.  Assist: Kerry Hough. Barton Dubois  (present throughout entire procedure and necessary for timely completion of the procedure) Anes: General LMA  EBL: Minimal  Fluids: 800 cc   Indications: 59 year old woman who works for [employer] and was injured at work.. Pt has failed conservative treatment with anti-inflammatory medicines, physical therapy, and modified activites but did get good temporarily from an intra-articular cortisone injection. Pain has recurred and patient desires elective arthroscopic evaluation and treatment of knee.  Right knee is most painful and will be done first.  Risks and benefits of surgery have been discussed and questions answered.  Procedure: Patient identified by arm band and taken to the operating room at the day surgery Center. The appropriate anesthetic monitors were attached, and General LMA anesthesia was induced without difficulty. Lateral post was applied to the table and bilateral lower extremities were prepped and draped in usual sterile fashion from the ankle to the midthigh. Time out procedure was performed. We began the operation on the right side by making standard inferior lateral and inferior medial peripatellar portals with a #11 blade allowing introduction of the arthroscope through the inferior lateral portal and the out flow to the inferior medial portal. Pump pressure was set at 60-85 mmHg and diagnostic arthroscopy  revealed grade III chondromalacia of the trochlea with flap tears debrided back to a stable margin similar findings on the apex of the patella.  Moving into the medial compartment we identified complex tearing of the medial and posterior horns of the medial meniscus  debrided back to a stable margin with the Arthrex full-radius resector and Excalibur tips.  Moving to the notch the ACL and the PCL are intact.  The lateral compartment was in excellent condition with no tearing of the meniscus or articular cartilage.  The gutters were cleared medially and laterally.  There were small amounts of articular cartilage noted in the joint fluid that were taken through the outflow.  At this point the arthroscopic instruments were removed from the right knee.  We did instill 10 cc of half percent Marcaine and epinephrine solution into the portals and into the joint itself.  We then directed our attention to the left knee and made similar peripatellar portals looking at the patellofemoral joint we noted similar chondromalacia grade 2 to grade 3 of the apex of the patella and trochlea that was likewise debrided.  Moving into the medial compartment the patient had a larger tear of the medial meniscus debrided back to a stable margin with a 4.5 mm Excalibur tip followed by cleanup with a 3.8 mm full-radius resector.  The medial femoral condyle had some small areas of grade 3 to grade IV chondromalacia with flap tears debrider back to a stable margin as well the ACL and the PCL were intact.  Lateral compartment was in good condition.  Minimal chondromalacia.  The gutters were cleared medially and laterally and likewise the knee was irrigated out with normal saline solution.  We instilled 10 cc of half percent Marcaine solution into the portals and another 10 cc into the joint.  At this point both knees had Xerofoam strips placed on the portals followed by 4 x 4's and Ace wrap dressings.  The patient was awakened extubated and taken to the recovery room  without difficulty.  Signed: Kerin Salen, MD

## 2020-09-23 ENCOUNTER — Encounter (HOSPITAL_COMMUNITY): Payer: Self-pay | Admitting: Orthopedic Surgery

## 2020-10-23 MED ORDER — NYSTATIN 100000 UNIT/GM EX CREA: 1.0000 "application " | TOPICAL_CREAM | Freq: Two times a day (BID) | CUTANEOUS | 1 refills | Status: DC

## 2020-11-21 MED ORDER — TROSPIUM CHLORIDE ER 60 MG PO CP24: 60.0000 mg | ORAL_CAPSULE | Freq: Every day | ORAL | 11 refills | Status: DC

## 2020-11-21 NOTE — Addendum Note (Signed)
Addended by: Eliezer Lofts E on: 11/21/2020 10:25 AM   Modules accepted: Orders

## 2021-01-21 ENCOUNTER — Encounter: Payer: Self-pay | Admitting: Family Medicine

## 2021-01-21 ENCOUNTER — Ambulatory Visit (INDEPENDENT_AMBULATORY_CARE_PROVIDER_SITE_OTHER): Payer: BC Managed Care – PPO | Admitting: Family Medicine

## 2021-01-21 ENCOUNTER — Other Ambulatory Visit: Payer: Self-pay

## 2021-01-21 DIAGNOSIS — R5383 Other fatigue: Secondary | ICD-10-CM

## 2021-01-21 DIAGNOSIS — I10 Essential (primary) hypertension: Secondary | ICD-10-CM

## 2021-01-21 DIAGNOSIS — F5104 Psychophysiologic insomnia: Secondary | ICD-10-CM

## 2021-01-21 NOTE — Progress Notes (Signed)
Patient ID: Carol Crosby, female    DOB: 02-Nov-1960, 60 y.o.   MRN: [id number]  This visit was conducted in person.  BP (!) 150/100   Pulse 74   Temp (!) 97.5 F (36.4 C) (Temporal)   Ht 5' 1.5" (1.562 m)   Wt 297 lb 4 oz (134.8 kg)   LMP  (LMP Unknown)   SpO2 93%   BMI 55.26 kg/m    CC:  Chief Complaint  Patient presents with  . Discuss Weight Loss Options  . [place]    Even with CPAP-Falls asleep at work    Subjective:   HPI: Carol Crosby is a 60 y.o. female presenting on 01/21/2021 for Discuss Weight Loss Options and Trouble Sleeping (Even with CPAP-Falls asleep at work)    Weight Management:   Body mass index is 55.26 kg/m.  She has tried natural weight loss supplements Exipure.. no change.  Trying to decrease portion size. She is limited activity by knee pain bilaterally.. uses cane.   Wt Readings from Last 3 Encounters:  01/21/21 297 lb 4 oz (134.8 kg)  09/16/20 288 lb (130.6 kg)  09/10/20 288 lb (130.6 kg)   Hypertension:    Poor control .. not on medication at this time.  Was on losartan HCTZ in past... She feels BP could be up  From white coat HTN No associated headache.   On recheck in office  BP133/88 BP Readings from Last 3 Encounters:  01/21/21 (!) 150/100  09/16/20 (!) 145/74  09/10/20 (!) 175/82  Using medication without problems or lightheadedness: none Chest pain with exertion: none Edema:none Short of breath:none Average home BPs: Other issues:  Body mass index is 55.26 kg/m.   Trouble sleeping .. despite using CPAP she falls a sleep at work.  She trouble  staying asleep... wakes up to urinate a lot 4-5 times. Trouble falling back to sleep  Falls a sleep.  Breakfast: coffee cream and sugar,  Pop tarts Lunch  Frequently skips. Cheese crackers. No snacking. Dinner: eats out, [place] a lot One mountain dew a day.  Hates water.     Relevant past medical, surgical, family and social history reviewed and updated as  indicated. Interim medical history since our last visit reviewed. Allergies and medications reviewed and updated. Outpatient Medications Prior to Visit  Medication Sig Dispense Refill  . MYRBETRIQ 50 MG TB24 tablet Take 50 mg by mouth daily.    . nabumetone (RELAFEN) 750 MG tablet Take 750 mg by mouth 2 (two) times daily.    Marland Kitchen nystatin cream (MYCOSTATIN) Apply 1 application topically 2 (two) times daily. 30 g 1  . oxyCODONE-acetaminophen (PERCOCET/ROXICET) 5-325 MG tablet Take 1 tablet by mouth every 4 (four) hours as needed for severe pain. 30 tablet 0  . Trospium Chloride 60 MG CP24 Take 1 capsule (60 mg total) by mouth daily before breakfast. 30 capsule 11   No facility-administered medications prior to visit.     Per HPI unless specifically indicated in ROS section below Review of Systems  Constitutional: Negative for fatigue and fever.  HENT: Negative for congestion.   Eyes: Negative for pain.  Respiratory: Negative for cough and shortness of breath.   Cardiovascular: Negative for chest pain, palpitations and leg swelling.  Gastrointestinal: Negative for abdominal pain.  Genitourinary: Negative for dysuria and vaginal bleeding.  Musculoskeletal: Negative for back pain.  Neurological: Negative for syncope, light-headedness and headaches.  Psychiatric/Behavioral: Negative for dysphoric mood.   Objective:  BP Marland Kitchen)  150/100   Pulse 74   Temp (!) 97.5 F (36.4 C) (Temporal)   Ht 5' 1.5" (1.562 m)   Wt 297 lb 4 oz (134.8 kg)   LMP  (LMP Unknown)   SpO2 93%   BMI 55.26 kg/m   Wt Readings from Last 3 Encounters:  01/21/21 297 lb 4 oz (134.8 kg)  09/16/20 288 lb (130.6 kg)  09/10/20 288 lb (130.6 kg)      Physical Exam Constitutional:      General: She is not in acute distress.    Appearance: Normal appearance. She is well-developed. She is obese. She is not ill-appearing or toxic-appearing.  HENT:     Head: Normocephalic.     Right Ear: Hearing, tympanic membrane, ear  canal and external ear normal.     Left Ear: Hearing, tympanic membrane, ear canal and external ear normal.     Nose: Nose normal.  Eyes:     General: Lids are normal. Lids are everted, no foreign bodies appreciated.     Conjunctiva/sclera: Conjunctivae normal.     Pupils: Pupils are equal, round, and reactive to light.  Neck:     Thyroid: No thyroid mass or thyromegaly.     Vascular: No carotid bruit.     Trachea: Trachea normal.  Cardiovascular:     Rate and Rhythm: Normal rate and regular rhythm.     Heart sounds: Normal heart sounds, S1 normal and S2 normal. No murmur heard. No gallop.   Pulmonary:     Effort: Pulmonary effort is normal. No respiratory distress.     Breath sounds: Normal breath sounds. No wheezing, rhonchi or rales.  Abdominal:     General: Bowel sounds are normal. There is no distension or abdominal bruit.     Palpations: Abdomen is soft. There is no fluid wave or mass.     Tenderness: There is no abdominal tenderness. There is no guarding or rebound.     Hernia: No hernia is present.  Musculoskeletal:     Cervical back: Normal range of motion and neck supple.  Lymphadenopathy:     Cervical: No cervical adenopathy.  Skin:    General: Skin is warm and dry.     Findings: No rash.  Neurological:     Mental Status: She is alert.     Cranial Nerves: No cranial nerve deficit.     Sensory: No sensory deficit.  Psychiatric:        Mood and Affect: Mood is not anxious or depressed.        Speech: Speech normal.        Behavior: Behavior normal. Behavior is cooperative.        Judgment: Judgment normal.       Results for orders placed or performed during the hospital encounter of 09/12/20  SARS CORONAVIRUS 2 (TAT 6-24 HRS) Nasopharyngeal Nasopharyngeal Swab   Specimen: Nasopharyngeal Swab  Result Value Ref Range   SARS Coronavirus 2 NEGATIVE NEGATIVE    This visit occurred during the SARS-CoV-2 public health emergency.  Safety protocols were in place,  including screening questions prior to the visit, additional usage of staff PPE, and extensive cleaning of exam room while observing appropriate contact time as indicated for disinfecting solutions.   COVID 19 screen:  No recent travel or known exposure to COVID19 The patient denies respiratory symptoms of COVID 19 at this time. The importance of social distancing was discussed today.   Assessment and Plan    Problem List Items  Addressed This Visit    Chronic insomnia    ON CPAP for sleep apnea. Frequent urination at night contributing to sleep issues.      HTN (hypertension), benign    Check BP at home.. daily for 2 weeks.. record the measurements.Marland Kitchen goal < 140/90.       Relevant Orders   Hemoglobin A1c (Completed)   Lipid panel (Completed)   Comprehensive metabolic panel (Completed)   Morbid obesity ([place]) - Primary    Look into water exercise.  Use My Fitness pal or noom to log food.  Increase fruits and veggies.Marland Kitchen goal 4 a day.  Try  Half and half  tea in place of soda or sweet tea. No skipping meals. Try to have protein and fiber in each meal.         Other Visit Diagnoses    Fatigue, unspecified type       Relevant Orders   Vitamin B12 (Completed)   CBC with Differential/Platelet (Completed)   TSH (Completed)   VITAMIN D 25 Hydroxy (Vit-D Deficiency, Fractures) (Completed)       Eliezer Lofts, MD

## 2021-01-21 NOTE — Patient Instructions (Addendum)
Check BP at home.. daily for 2 weeks.. record the measurements.Marland Kitchen goal < 140/90.  Look into water exercise.  Use My Fitness pal or noom to log food.  Increase fruits and veggies.Marland Kitchen goal 4 a day.  Try  Half and half  tea in place of soda or sweet tea. No skipping meals. Try to have protein and fiber in each meal.

## 2021-01-23 ENCOUNTER — Other Ambulatory Visit: Payer: Self-pay | Admitting: Family Medicine

## 2021-01-23 DIAGNOSIS — Z1231 Encounter for screening mammogram for malignant neoplasm of breast: Secondary | ICD-10-CM

## 2021-02-03 ENCOUNTER — Telehealth: Payer: Self-pay | Admitting: *Deleted

## 2021-02-03 NOTE — Telephone Encounter (Signed)
Received fax from [facility] requesting PA for Myrbetriq 50 mg.  Formulary Alternatives: darifenacin ext-rel, oxybutynin ext-rel, solifenacin, tolterodine, tolterodine ext-rel, trospium, trospium ext-rel, GEMTESA, TOVIAZ. Can Rx be change to formulary medication or continue PA?

## 2021-02-04 ENCOUNTER — Other Ambulatory Visit: Payer: Self-pay

## 2021-02-04 ENCOUNTER — Other Ambulatory Visit (INDEPENDENT_AMBULATORY_CARE_PROVIDER_SITE_OTHER): Payer: BC Managed Care – PPO

## 2021-02-04 DIAGNOSIS — R5383 Other fatigue: Secondary | ICD-10-CM | POA: Diagnosis not present

## 2021-02-04 DIAGNOSIS — I10 Essential (primary) hypertension: Secondary | ICD-10-CM

## 2021-02-04 LAB — COMPREHENSIVE METABOLIC PANEL
ALT: 16 U/L (ref 0–35)
AST: 16 U/L (ref 0–37)
Albumin: 3.7 g/dL (ref 3.5–5.2)
Alkaline Phosphatase: 147 U/L — ABNORMAL HIGH (ref 39–117)
BUN: 13 mg/dL (ref 6–23)
CO2: 25 mEq/L (ref 19–32)
Calcium: 8.6 mg/dL (ref 8.4–10.5)
Chloride: 107 mEq/L (ref 96–112)
Creatinine, Ser: 0.53 mg/dL (ref 0.40–1.20)
GFR: 101.47 mL/min (ref >60.00)
Glucose, Bld: 89 mg/dL (ref 70–99)
Potassium: 4.6 mEq/L (ref 3.5–5.1)
Sodium: 141 mEq/L (ref 135–145)
Total Bilirubin: 0.5 mg/dL (ref 0.2–1.2)
Total Protein: 6.6 g/dL (ref 6.0–8.3)

## 2021-02-04 LAB — CBC WITH DIFFERENTIAL/PLATELET
Basophils Absolute: 0.1 10*3/uL (ref 0.0–0.1)
Basophils Relative: 1 % (ref 0.0–3.0)
Eosinophils Absolute: 0.1 10*3/uL (ref 0.0–0.7)
Eosinophils Relative: 1.2 % (ref 0.0–5.0)
HCT: 43 % (ref 36.0–46.0)
Hemoglobin: 13.9 g/dL (ref 12.0–15.0)
Lymphocytes Relative: 30.8 % (ref 12.0–46.0)
Lymphs Abs: 1.7 10*3/uL (ref 0.7–4.0)
MCHC: 32.4 g/dL (ref 30.0–36.0)
MCV: 94 fl (ref 78.0–100.0)
Monocytes Absolute: 0.5 10*3/uL (ref 0.1–1.0)
Monocytes Relative: 8.5 % (ref 3.0–12.0)
Neutro Abs: 3.2 10*3/uL (ref 1.4–7.7)
Neutrophils Relative %: 58.5 % (ref 43.0–77.0)
Platelets: 255 10*3/uL (ref 150.0–400.0)
RBC: 4.57 Mil/uL (ref 3.87–5.11)
RDW: 14.9 % (ref 11.5–15.5)
WBC: 5.4 10*3/uL (ref 4.0–10.5)

## 2021-02-04 LAB — LIPID PANEL
Cholesterol: 158 mg/dL (ref 0–200)
HDL: 47.2 mg/dL (ref >39.00)
LDL Cholesterol: 88 mg/dL (ref 0–99)
NonHDL: 111.23
Total CHOL/HDL Ratio: 3
Triglycerides: 118 mg/dL (ref 0.0–149.0)
VLDL: 23.6 mg/dL (ref 0.0–40.0)

## 2021-02-04 LAB — HEMOGLOBIN A1C: Hgb A1c MFr Bld: 5.9 % (ref 4.6–6.5)

## 2021-02-04 LAB — VITAMIN D 25 HYDROXY (VIT D DEFICIENCY, FRACTURES): VITD: <7 ng/mL — ABNORMAL LOW (ref 30.00–100.00)

## 2021-02-04 LAB — TSH: TSH: 3.27 u[IU]/mL (ref 0.35–4.50)

## 2021-02-04 LAB — VITAMIN B12: Vitamin B-12: 269 pg/mL (ref 211–911)

## 2021-02-06 DIAGNOSIS — E2839 Other primary ovarian failure: Secondary | ICD-10-CM

## 2021-02-06 DIAGNOSIS — Z1211 Encounter for screening for malignant neoplasm of colon: Secondary | ICD-10-CM

## 2021-02-06 NOTE — Telephone Encounter (Signed)
There is nothing in the same family.... is issue mainly at night? If so I will send in oxybutynin at night.

## 2021-02-06 NOTE — Telephone Encounter (Signed)
No concern with isolated alk phos elevation.

## 2021-02-06 NOTE — Telephone Encounter (Signed)
Patient notified as instructed by telephone.  PA was denied for the Myrbetriq.  Patient made aware of this as well.  She states just have Dr. Diona Browner send in a formulary med listed that is close to or similar to Myrbetriq.  PA denial placed in Dr. Rometta Emery office in box for review.

## 2021-02-06 NOTE — Telephone Encounter (Signed)
I do not see record that she has tried these other meds in the past... Call and ask if she is willing to try a different medication  ( I Can chose best) or if she has tried some of these, otherwise PA will likely fail. I think the Myrbetriq was not helping very well anyway. Verify if most her symptoms are just at night please or if day and night.

## 2021-02-06 NOTE — Telephone Encounter (Signed)
Spoke with Carol Crosby.  She states she has tried most of the medications listed so I will complete PA for Mybetriq.  Lab results given to patient.  Please send Vit D Rx to [facility] on [address].  She is asking if she should be concerned about the elevated Alk Phos?  Please advise.

## 2021-02-10 NOTE — Telephone Encounter (Signed)
Left message for Lurie to return my call.

## 2021-02-11 NOTE — Addendum Note (Signed)
Addended by: Eliezer Lofts E on: 02/11/2021 06:18 PM   Modules accepted: Orders

## 2021-02-12 ENCOUNTER — Encounter: Payer: Self-pay | Admitting: *Deleted

## 2021-02-12 MED ORDER — OXYBUTYNIN CHLORIDE 5 MG PO TABS: 5.0000 mg | ORAL_TABLET | Freq: Every day | ORAL | 11 refills | Status: DC

## 2021-02-12 NOTE — Telephone Encounter (Signed)
I have been unable to reach patient by telephone.  I have sent her a MyChart message in regards to the incontinence medication.  Waiting on a reply.

## 2021-02-12 NOTE — Addendum Note (Signed)
Addended byEliezer Lofts E on: 02/12/2021 11:41 AM   Modules accepted: Orders

## 2021-02-12 NOTE — Telephone Encounter (Signed)
Pt called back and is aware as instructed... pt would like to try Oxybutynin, please send to [facility]

## 2021-02-17 ENCOUNTER — Other Ambulatory Visit: Payer: Self-pay | Admitting: Family Medicine

## 2021-02-17 DIAGNOSIS — E2839 Other primary ovarian failure: Secondary | ICD-10-CM

## 2021-02-17 MED ORDER — VITAMIN D3 1.25 MG (50000 UT) PO TABS: 1.0000 | ORAL_TABLET | ORAL | 1 refills | Status: DC

## 2021-02-26 NOTE — Assessment & Plan Note (Signed)
Look into water exercise.  Use My Fitness pal or noom to log food.  Increase fruits and veggies.Marland Kitchen goal 4 a day.  Try  Half and half  tea in place of soda or sweet tea. No skipping meals. Try to have protein and fiber in each meal.

## 2021-02-26 NOTE — Assessment & Plan Note (Signed)
ON CPAP for sleep apnea. Frequent urination at night contributing to sleep issues.

## 2021-02-26 NOTE — Assessment & Plan Note (Signed)
Check BP at home.. daily for 2 weeks.. record the measurements.Marland Kitchen goal < 140/90.

## 2021-03-17 ENCOUNTER — Other Ambulatory Visit: Payer: Self-pay

## 2021-03-17 ENCOUNTER — Ambulatory Visit
Admission: RE | Admit: 2021-03-17 | Discharge: 2021-03-17 | Disposition: A | Discharge status: Home / Self Care | Payer: BC Managed Care – PPO | Source: Ambulatory Visit | Attending: Family Medicine | Admitting: Family Medicine

## 2021-03-17 DIAGNOSIS — Z1231 Encounter for screening mammogram for malignant neoplasm of breast: Secondary | ICD-10-CM

## 2021-05-15 ENCOUNTER — Encounter: Payer: Self-pay | Admitting: Internal Medicine

## 2021-05-28 ENCOUNTER — Telehealth: Payer: Self-pay | Admitting: *Deleted

## 2021-05-28 NOTE — Telephone Encounter (Signed)
Dr.Gessner,  This patient is a direct screening colon. At her last PCP OV 12/2020 BMI was 55.26. hx of sleep apnea, HTN. I will confirm this with the patient. Okay for direct hospital colon or OV? Please advise. Thank you, Imaan Padgett PV

## 2021-05-28 NOTE — Telephone Encounter (Signed)
Called patient. Pt's last weight was 295 lbs. I explained that her procedure would be done at the hospital. PV for tomorrow and colon for [place] canceled. Patient is aware.   Sheri, please call this patient and discuss what times are available for a [facility] colon. She works at a school.

## 2021-05-28 NOTE — Telephone Encounter (Signed)
Please schedule the patient for a direct colonoscopy at the hospital

## 2021-05-28 NOTE — Telephone Encounter (Signed)
Patient contacted and scheduled for colonoscopy on 08/07/21 7:30, she will need to arrive at 6:00.  Previsit scheduled for 9/28

## 2021-05-28 NOTE — Telephone Encounter (Signed)
Call patient, left a message for the patient to call me back.

## 2021-06-13 ENCOUNTER — Encounter: Payer: BC Managed Care – PPO | Admitting: Internal Medicine

## 2021-06-20 ENCOUNTER — Other Ambulatory Visit: Payer: BC Managed Care – PPO

## 2021-07-23 ENCOUNTER — Other Ambulatory Visit: Payer: Self-pay

## 2021-07-23 ENCOUNTER — Ambulatory Visit (AMBULATORY_SURGERY_CENTER): Payer: BC Managed Care – PPO

## 2021-07-23 VITALS — Ht 61.5 in | Wt 297.0 lb

## 2021-07-23 DIAGNOSIS — Z1211 Encounter for screening for malignant neoplasm of colon: Secondary | ICD-10-CM

## 2021-07-23 NOTE — Progress Notes (Signed)

## 2021-07-28 ENCOUNTER — Encounter: Payer: Self-pay | Admitting: Internal Medicine

## 2021-07-29 ENCOUNTER — Encounter (HOSPITAL_COMMUNITY): Payer: Self-pay | Admitting: Internal Medicine

## 2021-08-06 NOTE — Anesthesia Preprocedure Evaluation (Addendum)
Anesthesia Evaluation  Patient identified by MRN, date of birth, ID band Patient awake    Reviewed: Allergy & Precautions, NPO status , Patient's Chart, lab work & pertinent test results  History of Anesthesia Complications (+) PONV and history of anesthetic complications  Airway Mallampati: III  TM Distance: >3 FB Neck ROM: Full    Dental  (+) Teeth Intact, Dental Advisory Given   Pulmonary sleep apnea and Continuous Positive Airway Pressure Ventilation , former smoker,    Pulmonary exam normal breath sounds clear to auscultation       Cardiovascular hypertension (denies any hx HTN, BP in preop 181/80), Normal cardiovascular exam Rhythm:Regular Rate:Normal     Neuro/Psych  Headaches, negative psych ROS   GI/Hepatic Neg liver ROS, GERD  Controlled,Screening colonoscopy    Endo/Other  Morbid obesityBMI 55  Renal/GU negative Renal ROS  negative genitourinary   Musculoskeletal  (+) Arthritis , Osteoarthritis,    Abdominal (+) + obese,   Peds  Hematology negative hematology ROS (+)   Anesthesia Other Findings   Reproductive/Obstetrics negative OB ROS                          Anesthesia Physical Anesthesia Plan  ASA: 4  Anesthesia Plan: MAC   Post-op Pain Management:    Induction:   PONV Risk Score and Plan: 2 and Propofol infusion and TIVA  Airway Management Planned: Natural Airway and Simple Face Mask  Additional Equipment: None  Intra-op Plan:   Post-operative Plan:   Informed Consent: I have reviewed the patients History and Physical, chart, labs and discussed the procedure including the risks, benefits and alternatives for the proposed anesthesia with the patient or authorized representative who has indicated his/her understanding and acceptance.     Dental advisory given  Plan Discussed with: CRNA  Anesthesia Plan Comments: (Super morbid obesity- low threshold for  conversion to GA/LMA)      Anesthesia Quick Evaluation

## 2021-08-07 ENCOUNTER — Ambulatory Visit (HOSPITAL_COMMUNITY)
Admission: RE | Admit: 2021-08-07 | Discharge: 2021-08-07 | Disposition: A | Discharge status: Home / Self Care | Payer: BC Managed Care – PPO | Attending: Internal Medicine | Admitting: Internal Medicine

## 2021-08-07 ENCOUNTER — Ambulatory Visit (HOSPITAL_COMMUNITY): Payer: BC Managed Care – PPO | Admitting: Anesthesiology

## 2021-08-07 ENCOUNTER — Encounter (HOSPITAL_COMMUNITY): Payer: Self-pay | Admitting: Internal Medicine

## 2021-08-07 ENCOUNTER — Encounter (HOSPITAL_COMMUNITY)
Admission: RE | Disposition: A | Discharge status: Home / Self Care | Payer: Self-pay | Source: Home / Self Care | Attending: Internal Medicine

## 2021-08-07 ENCOUNTER — Other Ambulatory Visit: Payer: Self-pay

## 2021-08-07 DIAGNOSIS — K635 Polyp of colon: Secondary | ICD-10-CM | POA: Insufficient documentation

## 2021-08-07 DIAGNOSIS — Z791 Long term (current) use of non-steroidal anti-inflammatories (NSAID): Secondary | ICD-10-CM | POA: Diagnosis not present

## 2021-08-07 DIAGNOSIS — Q438 Other specified congenital malformations of intestine: Secondary | ICD-10-CM | POA: Insufficient documentation

## 2021-08-07 DIAGNOSIS — Z1211 Encounter for screening for malignant neoplasm of colon: Secondary | ICD-10-CM | POA: Diagnosis not present

## 2021-08-07 DIAGNOSIS — Z881 Allergy status to other antibiotic agents status: Secondary | ICD-10-CM | POA: Diagnosis not present

## 2021-08-07 DIAGNOSIS — D125 Benign neoplasm of sigmoid colon: Secondary | ICD-10-CM | POA: Insufficient documentation

## 2021-08-07 DIAGNOSIS — Z96643 Presence of artificial hip joint, bilateral: Secondary | ICD-10-CM | POA: Diagnosis not present

## 2021-08-07 DIAGNOSIS — Z79899 Other long term (current) drug therapy: Secondary | ICD-10-CM | POA: Insufficient documentation

## 2021-08-07 DIAGNOSIS — Z85828 Personal history of other malignant neoplasm of skin: Secondary | ICD-10-CM | POA: Diagnosis not present

## 2021-08-07 HISTORY — PX: COLONOSCOPY WITH PROPOFOL: SHX5780

## 2021-08-07 HISTORY — PX: POLYPECTOMY: SHX5525

## 2021-08-07 SURGERY — COLONOSCOPY WITH PROPOFOL
Anesthesia: Monitor Anesthesia Care

## 2021-08-07 MED ORDER — PROPOFOL 500 MG/50ML IV EMUL
INTRAVENOUS | Status: DC | PRN
  Administered 2021-08-07: 120 ug/kg/min via INTRAVENOUS

## 2021-08-07 MED ORDER — LACTATED RINGERS IV SOLN: INTRAVENOUS | Status: DC | PRN

## 2021-08-07 MED ORDER — PROPOFOL 1000 MG/100ML IV EMUL
INTRAVENOUS | Status: AC
  Filled 2021-08-07: qty 100

## 2021-08-07 MED ORDER — PROPOFOL 500 MG/50ML IV EMUL
INTRAVENOUS | Status: AC
  Filled 2021-08-07: qty 250

## 2021-08-07 MED ORDER — SODIUM CHLORIDE 0.9 % IV SOLN: INTRAVENOUS | Status: DC

## 2021-08-07 SURGICAL SUPPLY — 21 items

## 2021-08-07 NOTE — Discharge Instructions (Signed)
I found and removed 2 tiny polyps that look benign.  All else ok.  I will let you know pathology results and when to have another routine colonoscopy by mail and/or My Chart.  I appreciate the opportunity to care for you. Gatha Mayer, MD, FACG  YOU HAD AN ENDOSCOPIC PROCEDURE TODAY: Refer to the procedure report and other information in the discharge instructions given to you for any specific questions about what was found during the examination. If this information does not answer your questions, please call Dr. Celesta Aver office at [phone] to clarify.   YOU SHOULD EXPECT: Some feelings of bloating in the abdomen. Passage of more gas than usual. Walking can help get rid of the air that was put into your GI tract during the procedure and reduce the bloating. If you had a lower endoscopy (such as a colonoscopy or flexible sigmoidoscopy) you may notice spotting of blood in your stool or on the toilet paper. Some abdominal soreness may be present for a day or two, also.  DIET: Your first meal following the procedure should be a light meal and then it is ok to progress to your normal diet. A half-sandwich or bowl of soup is an example of a good first meal. Heavy or fried foods are harder to digest and may make you feel nauseous or bloated. Drink plenty of fluids but you should avoid alcoholic beverages for 24 hours.   ACTIVITY: Your care partner should take you home directly after the procedure. You should plan to take it easy, moving slowly for the rest of the day. You can resume normal activity the day after the procedure however YOU SHOULD NOT DRIVE, use power tools, machinery or perform tasks that involve climbing or major physical exertion for 24 hours (because of the sedation medicines used during the test).   SYMPTOMS TO REPORT IMMEDIATELY: A gastroenterologist can be reached at any hour. Please call [phone]  for any of the following symptoms:  Following lower endoscopy  (colonoscopy, flexible sigmoidoscopy) Excessive amounts of blood in the stool  Significant tenderness, worsening of abdominal pains  Swelling of the abdomen that is new, acute  Fever of 100 or higher    FOLLOW UP:  If any biopsies were taken you will be contacted by phone or by letter within the next 1-3 weeks. Call [phone]  if you have not heard about the biopsies in 3 weeks.  Please also call with any specific questions about appointments or follow up tests.

## 2021-08-07 NOTE — Anesthesia Postprocedure Evaluation (Signed)
Anesthesia Post Note  Patient: FATMA RUTTEN  Procedure(s) Performed: COLONOSCOPY WITH PROPOFOL POLYPECTOMY     Patient location during evaluation: PACU Anesthesia Type: MAC Level of consciousness: awake and alert Pain management: pain level controlled Vital Signs Assessment: post-procedure vital signs reviewed and stable Respiratory status: spontaneous breathing, nonlabored ventilation and respiratory function stable Cardiovascular status: blood pressure returned to baseline and stable Postop Assessment: no apparent nausea or vomiting Anesthetic complications: no   No notable events documented.  Last Vitals:  Vitals:   08/07/21 0840 08/07/21 0845  BP: (!) 145/58   Pulse: 80 84  Resp: 13 13  Temp:    SpO2: 94% 95%    Last Pain:  Vitals:   08/07/21 0845  TempSrc:   PainSc: 0-No pain                 Pervis Hocking

## 2021-08-07 NOTE — Op Note (Addendum)
[facility] Patient Name: Carol Crosby Procedure Date: 08/07/2021 MRN: [id number] Attending MD: Gatha Mayer , MD Date of Birth: 1961-06-11 CSN: [id number] Age: 60 Admit Type: Outpatient Procedure:                Colonoscopy Indications:              Screening for colorectal malignant neoplasm, This                            is the patient's first colonoscopy Providers:                Gatha Mayer, MD, Luan Moore, Technician,                            Janee Morn, Technician, Particia Nearing, RN Referring MD:              Medicines:                Propofol per Anesthesia, Monitored Anesthesia Care Complications:            No immediate complications. Estimated Blood Loss:     Estimated blood loss was minimal. Procedure:                Pre-Anesthesia Assessment:                           - Prior to the procedure, a History and Physical                            was performed, and patient medications and                            allergies were reviewed. The patient's tolerance of                            previous anesthesia was also reviewed. The risks                            and benefits of the procedure and the sedation                            options and risks were discussed with the patient.                            All questions were answered, and informed consent                            was obtained. Prior Anticoagulants: The patient has                            taken no previous anticoagulant or antiplatelet                            agents. ASA Grade Assessment: IV - A patient with  severe systemic disease that is a constant threat                            to life. After reviewing the risks and benefits,                            the patient was deemed in satisfactory condition to                            undergo the procedure.                           After obtaining informed consent, the  colonoscope                            was passed under direct vision. Throughout the                            procedure, the patient's blood pressure, pulse, and                            oxygen saturations were monitored continuously. The                            CF-HQ190L ([id number]) Olympus colonoscope was                            introduced through the anus and advanced to the the                            cecum, identified by appendiceal orifice and                            ileocecal valve. The colonoscopy was somewhat                            difficult due to significant looping. Successful                            completion of the procedure was aided by applying                            abdominal pressure. The patient tolerated the                            procedure well. The quality of the bowel                            preparation was good. The ileocecal valve,                            appendiceal orifice, and rectum were photographed.  The bowel preparation used was Miralax via split                            dose instruction. Scope In: 7:57:42 AM Scope Out: 8:19:27 AM Scope Withdrawal Time: 0 hours 17 minutes 14 seconds  Total Procedure Duration: 0 hours 21 minutes 45 seconds  Findings:      The perianal and digital rectal examinations were normal.      Two sessile polyps were found in the sigmoid colon. The polyps were       diminutive in size. These polyps were removed with a cold snare.       Resection and retrieval were complete. Verification of patient       identification for the specimen was done. Estimated blood loss was       minimal.      The exam was otherwise without abnormality on direct and retroflexion       views. Impression:               - Two diminutive polyps in the sigmoid colon,                            removed with a cold snare. Resected and retrieved.                           - The examination was  otherwise normal on direct                            and retroflexion views. Moderate Sedation:      Not Applicable - Patient had care per Anesthesia. Recommendation:           - Patient has a contact number available for                            emergencies. The signs and symptoms of potential                            delayed complications were discussed with the                            patient. Return to normal activities tomorrow.                            Written discharge instructions were provided to the                            patient.                           - Resume previous diet.                           - Continue present medications.                           - Await pathology results.                           -  Repeat colonoscopy is recommended. The                            colonoscopy date will be determined after pathology                            results from today's exam become available for                            review.                           cc; Eliezer Lofts, MD Procedure Code(s):        --- Professional ---                           [phone], Colonoscopy, flexible; with removal of                            tumor(s), polyp(s), or other lesion(s) by snare                            technique Diagnosis Code(s):        --- Professional ---                           Z12.11, Encounter for screening for malignant                            neoplasm of colon                           K63.5, Polyp of colon CPT copyright 2019 American Medical Association. All rights reserved. The codes documented in this report are preliminary and upon coder review may  be revised to meet current compliance requirements. Gatha Mayer, MD 08/07/2021 8:34:05 AM This report has been signed electronically. Number of Addenda: 0

## 2021-08-07 NOTE — Transfer of Care (Signed)
Immediate Anesthesia Transfer of Care Note  Patient: Carol Crosby  Procedure(s) Performed: Procedure(s): COLONOSCOPY WITH PROPOFOL (N/A) POLYPECTOMY  Patient Location: PACU and Endoscopy Unit  Anesthesia Type:MAC  Level of Consciousness: awake, alert  and oriented  Airway & Oxygen Therapy: Patient Spontanous Breathing and Patient connected to nasal cannula oxygen  Post-op Assessment: Report given to RN and Post -op Vital signs reviewed and stable  Post vital signs: Reviewed and stable  Last Vitals: There were no vitals filed for this visit.  Complications: No apparent anesthesia complications

## 2021-08-07 NOTE — H&P (Signed)
[facility] History and Physical   Primary Care Physician:  Jinny Sanders, MD   Reason for Procedure:   Colon cancer screening  Plan:    colonoscopy     HPI: Carol Crosby is a 60 y.o. female here for screening colonoscopy   Past Medical History:  Diagnosis Date   Abdominal pain, right upper quadrant    Acute sinusitis, unspecified    Arthritis    knees, back, hip    Cancer ([place])    hx of skin cancer    Esophageal reflux    pt denies on 09/10/20    Excessive or frequent menstruation    History of bronchitis    Hypersomnia, unspecified    Hypertension    pt denies at preop visit of 09/10/20    Mixed incontinence urge and stress (female)(female)    Morbid obesity ([place])    Obstructive sleep apnea (adult) (pediatric)    cpap machine    Other chest pain    Palpitations    PONV (postoperative nausea and vomiting)    Scarlet fever    Sleep apnea    cpap   Streptococcal sore throat    Temporomandibular joint disorders, unspecified    Unspecified urinary incontinence     Past Surgical History:  Procedure Laterality Date   CESAREAN SECTION  2005   CHOLECYSTECTOMY     EXTREMITY CYST EXCISION  2000   Wrist   EYE SURGERY     lasik- both eyes    FRACTURE SURGERY     R ankle- screw for fixation    KNEE ARTHROSCOPY Left 2007   KNEE ARTHROSCOPY Bilateral 09/16/2020   Procedure: KNEE ARTHROSCOPY;  Surgeon: Frederik Pear, MD;  [facility]: [facility];  Service: Orthopedics;  Laterality: Bilateral;   REFRACTIVE SURGERY  1997   TEMPOROMANDIBULAR JOINT SURGERY  1981   TONSILLECTOMY     TOTAL HIP ARTHROPLASTY Left 04/21/2016   Procedure: LEFT TOTAL HIP ARTHROPLASTY ANTERIOR APPROACH;  Surgeon: Mcarthur Rossetti, MD;  Location: [facility];  Service: Orthopedics;  Laterality: Left;   TOTAL HIP ARTHROPLASTY Right 09/01/2016   Procedure: RIGHT TOTAL HIP ARTHROPLASTY ANTERIOR APPROACH and RIGHT WRIST ASPIRATION AND STEROID INJECTION;  Surgeon: Mcarthur Rossetti, MD;   Location: [facility];  Service: Orthopedics;  Laterality: Right;   TUBAL LIGATION      Prior to Admission medications   Medication Sig Start Date End Date Taking? Authorizing Provider  nabumetone (RELAFEN) 750 MG tablet Take 750 mg by mouth 2 (two) times daily. 12/25/20  Yes [provider]  oxybutynin (DITROPAN) 5 MG tablet Take 1 tablet (5 mg total) by mouth at bedtime. 02/12/21  Yes Bedsole, Amy E, MD  Cholecalciferol (VITAMIN D3) 1.25 MG (50000 UT) TABS Take 1 tablet by mouth once a week. Patient not taking: No sig reported 02/17/21   Jinny Sanders, MD  mupirocin ointment (BACTROBAN) 2 % Apply 1 application topically in the morning and at bedtime. 07/28/21   [provider]    Current Facility-Administered Medications  Medication Dose Route Frequency Provider Last Rate Last Admin   0.9 %  sodium chloride infusion   Intravenous Continuous Gatha Mayer, MD        Allergies as of 05/28/2021 - Review Complete 01/21/2021  Allergen Reaction Noted   Erythromycin base Nausea And Vomiting     Family History  Problem Relation Age of Onset   Migraines Mother    Hypertension Mother    Diabetes Father  Fibromyalgia Sister    Other Brother        ? Heart issue   Colon cancer Maternal Grandmother    Colon polyps Neg Hx    Esophageal cancer Neg Hx    Rectal cancer Neg Hx    Stomach cancer Neg Hx        Review of Systems:  All other review of systems negative except as mentioned in the HPI.  Physical Exam: Vital signs LMP  (LMP Unknown)   General:   Alert,  Well-developed, well-nourished, pleasant and cooperative in NAD Lungs:  Clear throughout to auscultation.   Heart:  Regular rate and rhythm; no murmurs, clicks, rubs,  or gallops. Abdomen:  Soft, nontender and nondistended. Normal bowel sounds.   Neuro/Psych:  Alert and cooperative. Normal mood and affect. A and O x 3   @Britteny Fiebelkorn  Simonne Maffucci, MD, [facility] [phone] (pager) 08/07/2021  7:44 AM@

## 2021-08-08 ENCOUNTER — Encounter (HOSPITAL_COMMUNITY): Payer: Self-pay | Admitting: Internal Medicine

## 2021-08-11 LAB — SURGICAL PATHOLOGY

## 2021-08-18 ENCOUNTER — Encounter: Payer: Self-pay | Admitting: Internal Medicine

## 2021-08-18 DIAGNOSIS — Z860101 Personal history of adenomatous and serrated colon polyps: Secondary | ICD-10-CM

## 2021-08-18 DIAGNOSIS — Z86010 Personal history of colonic polyps: Secondary | ICD-10-CM

## 2021-08-18 HISTORY — DX: Personal history of adenomatous and serrated colon polyps: Z86.0101

## 2021-08-18 HISTORY — DX: Personal history of colonic polyps: Z86.010

## 2021-09-08 ENCOUNTER — Other Ambulatory Visit: Payer: Self-pay

## 2021-09-08 ENCOUNTER — Ambulatory Visit: Payer: Self-pay

## 2021-09-08 ENCOUNTER — Ambulatory Visit (INDEPENDENT_AMBULATORY_CARE_PROVIDER_SITE_OTHER): Payer: BC Managed Care – PPO | Admitting: Orthopaedic Surgery

## 2021-09-08 ENCOUNTER — Encounter: Payer: Self-pay | Admitting: Orthopaedic Surgery

## 2021-09-08 VITALS — Ht 62.0 in | Wt 308.0 lb

## 2021-09-08 DIAGNOSIS — M25561 Pain in right knee: Secondary | ICD-10-CM | POA: Diagnosis not present

## 2021-09-08 DIAGNOSIS — G8929 Other chronic pain: Secondary | ICD-10-CM | POA: Diagnosis not present

## 2021-09-08 DIAGNOSIS — M25562 Pain in left knee: Secondary | ICD-10-CM

## 2021-09-08 NOTE — Progress Notes (Signed)
The patient is a 60 year old female who comes to me for second opinion as it relates to her knees.  I have actually replaced her hips before in the past.  She had bilateral knee arthroscopic surgeries a year ago by one of my colleagues in town.  This was from a work-related injuries.  She had meniscal tears of both knees.  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  There was complex tearing of the medial meniscus of the right and left knees.  He does not mention much in terms of cartilage changes on the right knee at the medial compartment but there was thinning of a trochlear groove with some areas of significant cartilage thinning of that further reviewed on the right side and the left side.  She does ambulate with a cane.  She has severe pain in the right knee and in her left knee and her knee is have still felt the same in terms of the pain even prior to surgery.  She does have a BMI of 56.33.  On exam both knees have a very large soft tissue envelope around them.  Neither knee has an effusion.  Both knees have medial joint line tenderness.  She does have decent range of motion of both knees but is hard to get a good exam in terms of stability based on the large soft tissue envelope around both knees.  The ACL was intact on both knees per the arthroscopy op note.  X-rays of both knees in the office today show moderate patellofemoral arthritis on the right knee and moderate patellofemoral and medial compartment arthritis on the left knee.  We did talk about her chronic pain with both knees.  Steroid injections have failed to provide any relief at this standpoint.  She is not a candidate for knee replacement surgery unfortunately given her significant obesity with a BMI of 56 and a large soft tissue envelope around both knees which certainly would make it difficult for performing successful knee  replacement surgery from my standpoint.

## 2021-09-15 ENCOUNTER — Other Ambulatory Visit: Payer: Self-pay | Admitting: Family Medicine

## 2021-09-24 NOTE — Progress Notes (Signed)
09/25/21- 60 yoF former smoker for sleep evaluation - former Dr Gwenette Greet pt Medical problem list includes HTN, OSA, Insomnia,  Allergic Rhinitis, Morbid Obesity,  NPSG 12/22/08- AHI 64/ hr, desaturation to 63%, Body weight 238 lbs CPAP [facility] auto 06/25/12 Epworth score-24 Body weight today-307 lbs Covid vax-2 Moderna Flu vax-today Current CPAP > 59 yo and failed last month. Uses CPAP every night. Not resting well even with Cpap Denies family hx of similar. ENT for tonsils in childhood. Seeks replacement of broken CPAP machine.  We will then reassess sleep patterns with consideration of insomnia.  Prior to Admission medications   Medication Sig Start Date End Date Taking? Authorizing Provider  nabumetone (RELAFEN) 750 MG tablet Take 750 mg by mouth 2 (two) times daily. 12/25/20  Yes [provider]  oxybutynin (DITROPAN) 5 MG tablet Take 1 tablet (5 mg total) by mouth at bedtime. 02/12/21  Yes Jinny Sanders, MD   Past Medical History:  Diagnosis Date   Abdominal pain, right upper quadrant    Acute sinusitis, unspecified    Arthritis    knees, back, hip    Cancer ([place])    hx of skin cancer    Esophageal reflux    pt denies on 09/10/20    Excessive or frequent menstruation    History of bronchitis    Hx of adenomatous polyp of colon 08/18/2021   Hypersomnia, unspecified    Hypertension    pt denies at preop visit of 09/10/20    Mixed incontinence urge and stress (female)(female)    Morbid obesity ([facility])    Obstructive sleep apnea (adult) (pediatric)    cpap machine    Other chest pain    Palpitations    PONV (postoperative nausea and vomiting)    Scarlet fever    Sleep apnea    cpap   Streptococcal sore throat    Temporomandibular joint disorders, unspecified    Unspecified urinary incontinence    Past Surgical History:  Procedure Laterality Date   CESAREAN SECTION  2005   CHOLECYSTECTOMY     COLONOSCOPY WITH PROPOFOL N/A 08/07/2021   Procedure: COLONOSCOPY WITH  PROPOFOL;  Surgeon: Gatha Mayer, MD;  [facility];  Service: Endoscopy;  Laterality: N/A;   EXTREMITY CYST EXCISION  2000   Wrist   EYE SURGERY     lasik- both eyes    FRACTURE SURGERY     R ankle- screw for fixation    KNEE ARTHROSCOPY Left 2007   KNEE ARTHROSCOPY Bilateral 09/16/2020   Procedure: KNEE ARTHROSCOPY;  Surgeon: Frederik Pear, MD;  [facility];  Service: Orthopedics;  Laterality: Bilateral;   POLYPECTOMY  08/07/2021   Procedure: POLYPECTOMY;  Surgeon: Gatha Mayer, MD;  [facility];  Service: Endoscopy;;   [place]     TOTAL HIP ARTHROPLASTY Left 04/21/2016   Procedure: LEFT TOTAL HIP ARTHROPLASTY ANTERIOR APPROACH;  Surgeon: Mcarthur Rossetti, MD;  Location: [facility];  Service: Orthopedics;  Laterality: Left;   TOTAL HIP ARTHROPLASTY Right 09/01/2016   Procedure: RIGHT TOTAL HIP ARTHROPLASTY ANTERIOR APPROACH and RIGHT WRIST ASPIRATION AND STEROID INJECTION;  Surgeon: Mcarthur Rossetti, MD;  Location: [facility];  Service: Orthopedics;  Laterality: Right;   TUBAL LIGATION     Family History  Problem Relation Age of Onset   Migraines Mother    Hypertension Mother    Diabetes Father    Fibromyalgia Sister  Other Brother        ? Heart issue   Colon cancer Maternal Grandmother    Colon polyps Neg Hx    Esophageal cancer Neg Hx    Rectal cancer Neg Hx    Stomach cancer Neg Hx    Social History   Socioeconomic History   Marital status: Married    Spouse name: Not on file   Number of children: Not on file   Years of education: Not on file   Highest education level: Not on file  Occupational History   Occupation: Marketing executive Support    Employer: [employer]  Tobacco Use   Smoking status: Former    Types: Cigarettes    Quit date: 10/26/2001    Years since quitting: 19.9   Smokeless tobacco: Never  Vaping Use   Vaping Use: Never used  Substance  and Sexual Activity   Alcohol use: No    Alcohol/week: 0.0 standard drinks   Drug use: No   Sexual activity: Not on file  Other Topics Concern   Not on file  Social History Narrative   No regular exercise      Diet: fruits and veggies, chicken, no water, lots of decaf   Social Determinants of Health   Financial Resource Strain: Not on file  Food Insecurity: Not on file  Transportation Needs: Not on file  Physical Activity: Not on file  Stress: Not on file  Social Connections: Not on file  Intimate Partner Violence: Not on file   ROS-see HPI   + = positive Constitutional:    weight loss, night sweats, fevers, chills, fatigue, lassitude. HEENT:    headaches, difficulty swallowing, tooth/dental problems, sore throat,       sneezing, itching, ear ache, nasal congestion, post nasal drip, snoring CV:    chest pain, orthopnea, PND, +swelling in lower extremities, anasarca,             dizziness, +palpitations Resp:   shortness of breath with exertion or at rest.                productive cough,   non-productive cough, coughing up of blood.              change in color of mucus.  wheezing.   Skin:    rash or lesions. GI:  No-   heartburn, indigestion, abdominal pain, nausea, vomiting, diarrhea,                 change in bowel habits, loss of appetite GU: dysuria, change in color of urine, no urgency or frequency.   flank pain. MS:   joint pain, stiffness, decreased range of motion, back pain. Neuro-     nothing unusual Psych:  change in mood or affect.  depression or anxiety.   memory loss.  OBJ- Physical Exam General- Alert, Oriented, Affect-appropriate, Distress- none acute, + obese Skin- rash-none, lesions- none, excoriation- none Lymphadenopathy- none Head- atraumatic            Eyes- Gross vision intact, PERRLA, conjunctivae and secretions clear            Ears- Hearing, canals-normal            Nose- Clear, no-Septal dev, mucus, polyps, erosion, perforation              Throat- Mallampati III thin/posterior , mucosa clear , drainage- none, tonsils- atrophic, + teeth Neck- flexible , trachea midline, no stridor , thyroid nl, carotid no bruit  Chest - symmetrical excursion , unlabored           Heart/CV- RRR , no murmur , no gallop  , no rub, nl s1 s2                           - JVD- none , edema- none, stasis changes- none, varices- none           Lung- clear to P&A, wheeze- none, cough- none , dullness-none, rub- none           Chest wall-  Abd-  Br/ Gen/ Rectal- Not done, not indicated Extrem- cyanosis- none, clubbing, none, atrophy- none, strength- nl Neuro- grossly intact to observation

## 2021-09-25 ENCOUNTER — Other Ambulatory Visit: Payer: Self-pay

## 2021-09-25 ENCOUNTER — Ambulatory Visit (INDEPENDENT_AMBULATORY_CARE_PROVIDER_SITE_OTHER): Payer: BC Managed Care – PPO | Admitting: Internal Medicine

## 2021-09-25 ENCOUNTER — Encounter: Payer: Self-pay | Admitting: Internal Medicine

## 2021-09-25 VITALS — BP 128/80 | HR 86 | Temp 98.5°F | Ht 62.0 in | Wt 307.0 lb

## 2021-09-25 DIAGNOSIS — G4733 Obstructive sleep apnea (adult) (pediatric): Secondary | ICD-10-CM

## 2021-09-25 DIAGNOSIS — Z23 Encounter for immunization: Secondary | ICD-10-CM

## 2021-09-25 NOTE — Patient Instructions (Signed)
Order- DME  [facility]- please replace old CPAP machine that failed. Auto 5-20, mask of choice, humidifier, supplies, AirView, card  Order flu vax standard  Please call if we can help

## 2021-09-25 NOTE — Assessment & Plan Note (Signed)
Significant benefit is likely if she can be successful with a weight loss program like [facility]

## 2021-09-25 NOTE — Assessment & Plan Note (Signed)
She describes good compliance and control until her CPAP machine broke.  She does say sleep is never fully restful, even with CPAP.  Will get CPAP restarted then assess. Plan-[facility] to replace broken old CPAP machine auto 5-15

## 2021-10-23 ENCOUNTER — Other Ambulatory Visit: Payer: Self-pay

## 2021-10-23 ENCOUNTER — Encounter (HOSPITAL_BASED_OUTPATIENT_CLINIC_OR_DEPARTMENT_OTHER): Payer: Self-pay | Admitting: Emergency Medicine

## 2021-10-23 ENCOUNTER — Emergency Department (HOSPITAL_BASED_OUTPATIENT_CLINIC_OR_DEPARTMENT_OTHER)
Admission: EM | Admit: 2021-10-23 | Discharge: 2021-10-23 | Disposition: A | Discharge status: Home / Self Care | Payer: BC Managed Care – PPO | Attending: Emergency Medicine | Admitting: Emergency Medicine

## 2021-10-23 DIAGNOSIS — R21 Rash and other nonspecific skin eruption: Secondary | ICD-10-CM | POA: Diagnosis not present

## 2021-10-23 DIAGNOSIS — R5383 Other fatigue: Secondary | ICD-10-CM | POA: Diagnosis not present

## 2021-10-23 DIAGNOSIS — R531 Weakness: Secondary | ICD-10-CM | POA: Diagnosis not present

## 2021-10-23 DIAGNOSIS — Z96643 Presence of artificial hip joint, bilateral: Secondary | ICD-10-CM | POA: Insufficient documentation

## 2021-10-23 DIAGNOSIS — Z87891 Personal history of nicotine dependence: Secondary | ICD-10-CM | POA: Insufficient documentation

## 2021-10-23 DIAGNOSIS — U071 COVID-19: Secondary | ICD-10-CM | POA: Diagnosis not present

## 2021-10-23 DIAGNOSIS — I10 Essential (primary) hypertension: Secondary | ICD-10-CM | POA: Diagnosis not present

## 2021-10-23 DIAGNOSIS — Z85828 Personal history of other malignant neoplasm of skin: Secondary | ICD-10-CM | POA: Insufficient documentation

## 2021-10-23 DIAGNOSIS — R509 Fever, unspecified: Secondary | ICD-10-CM | POA: Diagnosis present

## 2021-10-23 DIAGNOSIS — E860 Dehydration: Secondary | ICD-10-CM | POA: Insufficient documentation

## 2021-10-23 LAB — CBC
HCT: 49.4 % — ABNORMAL HIGH (ref 36.0–46.0)
Hemoglobin: 15.4 g/dL — ABNORMAL HIGH (ref 12.0–15.0)
MCH: 29.6 pg (ref 26.0–34.0)
MCHC: 31.2 g/dL (ref 30.0–36.0)
MCV: 95 fL (ref 80.0–100.0)
Platelets: 249 10*3/uL (ref 150–400)
RBC: 5.2 MIL/uL — ABNORMAL HIGH (ref 3.87–5.11)
RDW: 14.6 % (ref 11.5–15.5)
WBC: 7.1 10*3/uL (ref 4.0–10.5)
nRBC: 0 % (ref 0.0–0.2)

## 2021-10-23 LAB — BASIC METABOLIC PANEL
Anion gap: 9 (ref 5–15)
BUN: 13 mg/dL (ref 6–20)
CO2: 28 mmol/L (ref 22–32)
Calcium: 9 mg/dL (ref 8.9–10.3)
Chloride: 103 mmol/L (ref 98–111)
Creatinine, Ser: 0.63 mg/dL (ref 0.44–1.00)
GFR, Estimated: >60 mL/min (ref >60)
Glucose, Bld: 91 mg/dL (ref 70–99)
Potassium: 4 mmol/L (ref 3.5–5.1)
Sodium: 140 mmol/L (ref 135–145)

## 2021-10-23 LAB — URINALYSIS, ROUTINE W REFLEX MICROSCOPIC
Bilirubin Urine: NEGATIVE
Glucose, UA: NEGATIVE mg/dL
Hgb urine dipstick: NEGATIVE
Ketones, ur: NEGATIVE mg/dL
Leukocytes,Ua: NEGATIVE
Nitrite: NEGATIVE
Protein, ur: 30 mg/dL — AB
Specific Gravity, Urine: 1.022 (ref 1.005–1.030)
pH: 5.5 (ref 5.0–8.0)

## 2021-10-23 MED ORDER — SODIUM CHLORIDE 0.9 % IV BOLUS
1000.0000 mL | Freq: Once | INTRAVENOUS | Status: AC
  Administered 2021-10-23: 18:00:00 1000 mL via INTRAVENOUS

## 2021-10-23 MED ORDER — KETOROLAC TROMETHAMINE 30 MG/ML IJ SOLN
30.0000 mg | Freq: Once | INTRAMUSCULAR | Status: AC
  Administered 2021-10-23: 18:00:00 30 mg via INTRAVENOUS
  Filled 2021-10-23: qty 1

## 2021-10-23 NOTE — ED Provider Notes (Signed)
[facility] DE[facility] Provider Note   CSN: [id number] Arrival date & time: 10/23/21  1420     History Chief Complaint  Patient presents with   Rash   Covid Positive    Carol Crosby is a 60 y.o. female.  HPI  60 year old female with past medical history of obesity, HTN, OSA presents to the emergency department with weakness and fatigue.  Patient was diagnosed with COVID about 10 days ago.  She states her primary symptoms were fever, nonproductive cough.  She is going on over a week now and continues to feel very fatigued, no significant improvement.  Was not placed on any outpatient medication for COVID.  She is been taking over-the-counter medication without significant relief.  Endorses a significant decrease in oral appetite, specifically with liquids.  She feels dehydrated.  She has also developed an itchy papular rash on her upper back and bilateral legs.  Past Medical History:  Diagnosis Date   Abdominal pain, right upper quadrant    Acute sinusitis, unspecified    Arthritis    knees, back, hip    Cancer ([place])    hx of skin cancer    Esophageal reflux    pt denies on 09/10/20    Excessive or frequent menstruation    History of bronchitis    Hx of adenomatous polyp of colon 08/18/2021   Hypersomnia, unspecified    Hypertension    pt denies at preop visit of 09/10/20    Mixed incontinence urge and stress (female)(female)    Morbid obesity ([place])    Obstructive sleep apnea (adult) (pediatric)    cpap machine    Other chest pain    Palpitations    PONV (postoperative nausea and vomiting)    Scarlet fever    Sleep apnea    cpap   Streptococcal sore throat    Temporomandibular joint disorders, unspecified    Unspecified urinary incontinence     Patient Active Problem List   Diagnosis Date Noted   Hx of adenomatous polyp of colon 08/18/2021   Acute medial meniscus tear of left knee 09/13/2020   Acute medial meniscus tear of right knee 09/13/2020    Allergic rhinitis 07/25/2019   Skin lesion of forehead 05/05/2019   Left lateral epicondylitis 09/27/2017   Anterior neck pain 09/27/2017   Skin lesion of chest wall 09/27/2017   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/2016   Unilateral primary osteoarthritis, right hip 04/21/2016   Status post total replacement of left hip 04/21/2016   Chronic insomnia 12/31/2015   Lipoma 12/10/2014   Right shoulder pain 12/10/2014   Severe headache 01/23/2014   HTN (hypertension), benign 01/09/2014   Right knee pain 01/09/2014   OBSTRUCTIVE SLEEP APNEA 01/14/2009   Morbid obesity ([place]) 11/30/2007   INCONTINENCE, MIXED, URGE/STRESS 11/30/2007    Past Surgical History:  Procedure Laterality Date   CESAREAN SECTION  2005   CHOLECYSTECTOMY     COLONOSCOPY WITH PROPOFOL N/A 08/07/2021   Procedure: COLONOSCOPY WITH PROPOFOL;  Surgeon: Gatha Mayer, MD;  [facility];  Service: Endoscopy;  Laterality: N/A;   EXTREMITY CYST EXCISION  2000   Wrist   EYE SURGERY     lasik- both eyes    FRACTURE SURGERY     R ankle- screw for fixation    KNEE ARTHROSCOPY Left 2007   KNEE ARTHROSCOPY Bilateral 09/16/2020   Procedure: KNEE ARTHROSCOPY;  Surgeon: Frederik Pear, MD;  [facility];  Service: Orthopedics;  Laterality:  Bilateral;   POLYPECTOMY  08/07/2021   Procedure: POLYPECTOMY;  Surgeon: Gatha Mayer, MD;  [facility];  Service: Endoscopy;;   [place]     TOTAL HIP ARTHROPLASTY Left 04/21/2016   Procedure: LEFT TOTAL HIP ARTHROPLASTY ANTERIOR APPROACH;  Surgeon: Mcarthur Rossetti, MD;  Location: [facility];  Service: Orthopedics;  Laterality: Left;   TOTAL HIP ARTHROPLASTY Right 09/01/2016   Procedure: RIGHT TOTAL HIP ARTHROPLASTY ANTERIOR APPROACH and RIGHT WRIST ASPIRATION AND STEROID INJECTION;  Surgeon: Mcarthur Rossetti, MD;  Location: [facility];  Service: Orthopedics;  Laterality: Right;    TUBAL LIGATION       OB History   No obstetric history on file.     Family History  Problem Relation Age of Onset   Migraines Mother    Hypertension Mother    Diabetes Father    Fibromyalgia Sister    Other Brother        ? Heart issue   Colon cancer Maternal Grandmother    Colon polyps Neg Hx    Esophageal cancer Neg Hx    Rectal cancer Neg Hx    Stomach cancer Neg Hx     Social History   Tobacco Use   Smoking status: Former    Types: Cigarettes    Quit date: 10/26/2001    Years since quitting: 20.0   Smokeless tobacco: Never  Vaping Use   Vaping Use: Never used  Substance Use Topics   Alcohol use: No    Alcohol/week: 0.0 standard drinks   Drug use: No    Home Medications Prior to Admission medications   Medication Sig Start Date End Date Taking? Authorizing Provider  nabumetone (RELAFEN) 750 MG tablet Take 750 mg by mouth 2 (two) times daily. 12/25/20   [provider]  oxybutynin (DITROPAN) 5 MG tablet Take 1 tablet (5 mg total) by mouth at bedtime. 02/12/21   Jinny Sanders, MD    Allergies    Erythromycin base  Review of Systems   Review of Systems  Constitutional:  Positive for appetite change, fatigue and fever. Negative for chills.  HENT:  Negative for congestion.   Eyes:  Negative for visual disturbance.  Respiratory:  Positive for cough. Negative for shortness of breath.   Cardiovascular:  Negative for chest pain.  Gastrointestinal:  Negative for abdominal pain, diarrhea and vomiting.  Genitourinary:  Negative for dysuria.  Skin:  Positive for rash.  Neurological:  Negative for headaches.   Physical Exam Updated Vital Signs BP 120/80 (BP Location: Left Arm)    Pulse 76    Temp 98.3 F (36.8 C) (Oral)    Resp 16    Ht 5\' 2"  (1.575 m)    Wt 133.8 kg    LMP  (LMP Unknown)    SpO2 96%    BMI 53.96 kg/m   Physical Exam Vitals and nursing note reviewed.  Constitutional:      General: She is not in acute distress.    Appearance: Normal  appearance.     Comments: Fatigued appearing  HENT:     Head: Normocephalic.     Mouth/Throat:     Mouth: Mucous membranes are moist.  Cardiovascular:     Rate and Rhythm: Normal rate.  Pulmonary:     Effort: Pulmonary effort is normal. No respiratory distress.     Breath sounds: No wheezing or rales.  Abdominal:  Palpations: Abdomen is soft.     Tenderness: There is no abdominal tenderness.  Skin:    General: Skin is warm.     Comments: Papular rash along the upper back that the patient states is itchy, small areas of excoriation, no blisters or drainage, similar rash on the lower extremities posterior aspect, rashes bilateral  Neurological:     Mental Status: She is alert and oriented to person, place, and time. Mental status is at baseline.  Psychiatric:        Mood and Affect: Mood normal.    ED Results / Procedures / Treatments   Labs (all labs ordered are listed, but only abnormal results are displayed) Labs Reviewed  CBC - Abnormal; Notable for the following components:      Result Value   RBC 5.20 (*)    Hemoglobin 15.4 (*)    HCT 49.4 (*)    All other components within normal limits  URINALYSIS, ROUTINE W REFLEX MICROSCOPIC - Abnormal; Notable for the following components:   APPearance HAZY (*)    Protein, ur 30 (*)    All other components within normal limits  BASIC METABOLIC PANEL    EKG EKG Interpretation  Date/Time:  Thursday October 23 2021 14:43:32 EST Ventricular Rate:  76 PR Interval:  168 QRS Duration: 84 QT Interval:  350 QTC Calculation: 393 R Axis:   -17 Text Interpretation: Normal sinus rhythm Moderate voltage criteria for LVH, may be normal variant ( R in aVL , Cornell product ) Anterolateral infarct (cited on or before 10-Sep-2020) Abnormal ECG When compared with ECG of 10-Sep-2020 10:11, Nonspecific T wave abnormality now evident in Anterior leads Confirmed by Dorie Rank [phone]) on 10/23/2021 2:45:27 PM  Radiology No results  found.  Procedures Procedures   Medications Ordered in ED Medications  sodium chloride 0.9 % bolus 1,000 mL (has no administration in time range)  ketorolac (TORADOL) 30 MG/ML injection 30 mg (30 mg Intravenous Given 10/23/21 1808)    ED Course  I have reviewed the triage vital signs and the nursing notes.  Pertinent labs & imaging results that were available during my care of the patient were reviewed by me and considered in my medical decision making (see chart for details).    MDM Rules/Calculators/A&P                          60 year old known COVID-positive female presents emergency department with ongoing weakness and mild symptoms.  Appears clinically dehydrated, vitals are stable.  No distress.  Blood work is reassuring.  After IV fluids and medication patient feels significantly improved.  She is requesting be discharged home.  No signs of acute complications from COVID.  She otherwise is well-appearing for outpatient symptomatic treatment.  Patient at this time appears safe and stable for discharge and will be treated as an outpatient.  Discharge plan and strict return to ED precautions discussed, patient verbalizes understanding and agreement.     Final Clinical Impression(s) / ED Diagnoses Final diagnoses:  None    Rx / DC Orders ED Discharge Orders     None        Lorelle Gibbs, DO 10/23/21 2027

## 2021-10-23 NOTE — ED Triage Notes (Signed)
Pt via pov from home with weakness since covid diagnosis on 12/17. She feels like she is not getting better, possibly dehydrated. Pt alert & oriented, nad noted.

## 2021-10-23 NOTE — ED Triage Notes (Signed)
Pt also c/o rash on her back and legs

## 2021-10-23 NOTE — Discharge Instructions (Signed)
You have been seen and discharged from the emergency department.  Your blood work was normal and reassuring.  Continue to take over-the-counter medication as needed for symptom control.  Stay well-hydrated.  Follow-up with your primary provider for reevaluation and further care. Take home medications as prescribed. If you have any worsening symptoms or further concerns for your health please return to an emergency department for further evaluation.

## 2021-11-07 ENCOUNTER — Encounter: Payer: Self-pay | Admitting: Family Medicine

## 2021-11-07 MED ORDER — OZEMPIC (0.25 OR 0.5 MG/DOSE) 2 MG/1.5ML ~~LOC~~ SOPN: 0.5000 mg | PEN_INJECTOR | SUBCUTANEOUS | 11 refills | Status: DC

## 2021-11-07 NOTE — Telephone Encounter (Signed)
I will send in a prescription for low dose Ozempic.. make an appt to follow up on the start 2-4 weeks after starting it.  Dose: 0.5 mg Route: Subcutaneous Frequency: Weekly  Dispense Quantity: 1.5 mL Refills: 11        Sig: Inject 0.5 mg into the skin once a week.    [place], [place] - [address]  [place] [address]  Phone:  [phone]  Fax:  [phone]

## 2021-12-03 ENCOUNTER — Telehealth: Payer: Self-pay | Admitting: Internal Medicine

## 2021-12-03 ENCOUNTER — Other Ambulatory Visit: Payer: BC Managed Care – PPO

## 2021-12-03 NOTE — Telephone Encounter (Signed)
Sending to [facility] per protocol since order already in, thanks!

## 2021-12-04 ENCOUNTER — Ambulatory Visit
Admission: RE | Admit: 2021-12-04 | Discharge: 2021-12-04 | Disposition: A | Discharge status: Home / Self Care | Payer: BC Managed Care – PPO | Source: Ambulatory Visit | Attending: Family Medicine | Admitting: Family Medicine

## 2021-12-04 DIAGNOSIS — E2839 Other primary ovarian failure: Secondary | ICD-10-CM

## 2021-12-05 ENCOUNTER — Encounter: Payer: Self-pay | Admitting: Family Medicine

## 2021-12-05 DIAGNOSIS — M8580 Other specified disorders of bone density and structure, unspecified site: Secondary | ICD-10-CM | POA: Insufficient documentation

## 2021-12-10 NOTE — Telephone Encounter (Signed)
Order was sent to [facility] on 12/2 and confirmation received from [place] [facility] on 12/2.  I called and spoke to [place].  She states they do have order but are working on backlog at this time.  Shouldn't be too much longer for pt.  She is going to call pt and give her update.  Nothing further needed.

## 2021-12-16 ENCOUNTER — Telehealth: Payer: Self-pay | Admitting: Internal Medicine

## 2021-12-16 DIAGNOSIS — G4733 Obstructive sleep apnea (adult) (pediatric): Secondary | ICD-10-CM

## 2021-12-18 NOTE — Telephone Encounter (Signed)
Called and LVM in regards to CPAP order to confirm DME company. Will try again later.

## 2021-12-24 NOTE — Telephone Encounter (Signed)
Called and spoke with patient to see if she is still wanting to switch from [place] to [place]. She said yes because she can get a cpap machine quicker with them than she can [facility]. New order has been placed. Patient's appt for tomorrow has been canceled due to her not feeling well. She said she would call back to reschedule. Nothing further needed at this time.  ?

## 2021-12-24 NOTE — Progress Notes (Deleted)
09/25/21- 80 yoF former smoker for sleep evaluation - former Dr Gwenette Greet pt ?Medical problem list includes HTN, OSA, Insomnia,  Allergic Rhinitis, Morbid Obesity,  ?NPSG 12/22/08- AHI 64/ hr, desaturation to 63%, Body weight 238 lbs ?CPAP [facility] auto 06/25/12 ?Epworth score-24 ?Body weight today-307 lbs ?Covid vax-2 Moderna ?Flu vax-today ?Current CPAP > 60 yo and failed last month. Uses CPAP every night. Not resting well even with Cpap ?Denies family hx of similar. ENT for tonsils in childhood. ?Seeks replacement of broken CPAP machine.  We will then reassess sleep patterns with consideration of insomnia. ? ?12/25/21- 60 yoF former smoker followed for OSA, complicated by HTN, Insomnia,  Allergic Rhinitis, Morbid Obesity, Covid infection [id number], ?CPAP auto 5-20/  [facility]   ?Download compliance- ?Body weight today- ?Covid vax- ?Flu vax- ? ? ? ?ROS-see HPI   + = positive ?Constitutional:    weight loss, night sweats, fevers, chills, fatigue, lassitude. ?HEENT:    headaches, difficulty swallowing, tooth/dental problems, sore throat,  ?     sneezing, itching, ear ache, nasal congestion, post nasal drip, snoring ?CV:    chest pain, orthopnea, PND, +swelling in lower extremities, anasarca,             dizziness, +palpitations ?Resp:   shortness of breath with exertion or at rest.   ?             productive cough,   non-productive cough, coughing up of blood.   ?           change in color of mucus.  wheezing.   ?Skin:    rash or lesions. ?GI:  No-   heartburn, indigestion, abdominal pain, nausea, vomiting, diarrhea,  ?               change in bowel habits, loss of appetite ?GU: dysuria, change in color of urine, no urgency or frequency.   flank pain. ?MS:   joint pain, stiffness, decreased range of motion, back pain. ?Neuro-     nothing unusual ?Psych:  change in mood or affect.  depression or anxiety.   memory loss. ? ?OBJ- Physical Exam ?General- Alert, Oriented, Affect-appropriate, Distress- none acute, + obese ?Skin-  rash-none, lesions- none, excoriation- none ?Lymphadenopathy- none ?Head- atraumatic ?           Eyes- Gross vision intact, PERRLA, conjunctivae and secretions clear ?           Ears- Hearing, canals-normal ?           Nose- Clear, no-Septal dev, mucus, polyps, erosion, perforation  ?           Throat- Mallampati III thin/posterior , mucosa clear , drainage- none, tonsils- atrophic, + teeth ?Neck- flexible , trachea midline, no stridor , thyroid nl, carotid no bruit ?Chest - symmetrical excursion , unlabored ?          Heart/CV- RRR , no murmur , no gallop  , no rub, nl s1 s2 ?                          - JVD- none , edema- none, stasis changes- none, varices- none ?          Lung- clear to P&A, wheeze- none, cough- none , dullness-none, rub- none ?          Chest wall-  ?Abd-  ?Br/ Gen/ Rectal- Not done, not indicated ?Extrem- cyanosis- none, clubbing, none, atrophy- none, strength- nl ?Neuro- grossly  intact to observation ? ? ? ?

## 2021-12-25 ENCOUNTER — Ambulatory Visit: Payer: BC Managed Care – PPO | Admitting: Internal Medicine

## 2022-01-21 ENCOUNTER — Encounter: Payer: Self-pay | Admitting: Family Medicine

## 2022-02-26 ENCOUNTER — Encounter: Payer: Self-pay | Admitting: Family Medicine

## 2022-04-10 MED ORDER — SEMAGLUTIDE (1 MG/DOSE) 4 MG/3ML ~~LOC~~ SOPN: 1.0000 mg | PEN_INJECTOR | SUBCUTANEOUS | 11 refills | Status: DC

## 2022-06-09 ENCOUNTER — Telehealth: Payer: Self-pay

## 2022-06-09 NOTE — Telephone Encounter (Signed)
Prior auth started for Ozempic (1 MG/DOSE) '4MG'$ /3ML pen-injectors. RENEZMAE CANLAS (Key: [id number]) Rx #: [id number] Waiting for determination.

## 2022-06-10 NOTE — Telephone Encounter (Signed)
Prior auth for Ozempic (1 MG/DOSE) '4MG'$ /3ML pen-injectors has been denied. Carol Crosby (Key: [id number]) Rx #: [id number]  [facility]  received a request from your provider for coverage of Ozempic (semaglutide). The request was denied because: Your plan only covers this drug when it is used for certain health conditions. Covered use is for type 2 diabetes mellitus. Your plan does not cover the drug for your health condition that your doctor told us you have.  Denial letter sent to scanning.

## 2022-06-11 ENCOUNTER — Telehealth: Payer: Self-pay

## 2022-06-11 MED ORDER — SEMAGLUTIDE-WEIGHT MANAGEMENT 1 MG/0.5ML ~~LOC~~ SOAJ: 1.0000 mg | SUBCUTANEOUS | 0 refills | Status: DC

## 2022-06-11 NOTE — Telephone Encounter (Signed)
Prior auth started for [facility] '1MG'$ /0.5ML auto-injectors. Carol Crosby (KeyJacqualine Code) Rx #: [id number] Waiting for determination.

## 2022-06-11 NOTE — Addendum Note (Signed)
Addended by: Eliezer Lofts E on: 06/11/2022 09:57 AM   Modules accepted: Orders

## 2022-06-11 NOTE — Telephone Encounter (Signed)
This patient does not have diabetes.  Semaglutide  1 mg (I thought I was sending BIPJRP).   I will try to resend.  Let pt know.

## 2022-06-12 NOTE — Telephone Encounter (Signed)
See other phone note regarding PA and medication change.

## 2022-06-12 NOTE — Telephone Encounter (Signed)
Patient notified as instructed by telephone and verbalized understanding. 

## 2022-06-12 NOTE — Telephone Encounter (Signed)
Prior auth for [name] '1MG'$ /0.5ML auto-injectors has been approved. Carol Crosby (KeyJacqualine Code) Rx #: [id number] [facility]  received a request from your provider for coverage of Wegovy (semaglutide injection). As long as you remain covered by the [facility] and there are no changes to your plan benefits, this request is approved for the following time period: 06/11/2022 - 01/10/2023  Patient notified via mychart.  Approval letter sent to scanning.

## 2022-07-06 NOTE — Telephone Encounter (Signed)
Pt called in requesting a call back stated RX Mancel [facility]ons is on back order wants to know is there alternative . Please Advise [phone]

## 2022-07-07 NOTE — Telephone Encounter (Signed)
Jorgia notified as instructed by telephone.  She states she received a text from [facility] stating the [facility] should be in stock in 2 days so she will wait to see if they actually get it in.  While on the phone I noticed patient had not been seen since 01/21/2021.   CPE scheduled for 07/16/22 at 3:40 pm.  She is requesting same day labs.  I advised her to fast 4 hours prior to her appointment.

## 2022-07-07 NOTE — Telephone Encounter (Signed)
Call There is no alternative in the same family as Carol Crosby.  She has hypertension and is not a candidate for phentermine.  If she is interested I would be happy to refer her to [facility] a bariatric weight loss center.

## 2022-07-16 ENCOUNTER — Other Ambulatory Visit (HOSPITAL_COMMUNITY)
Admission: RE | Admit: 2022-07-16 | Discharge: 2022-07-16 | Disposition: A | Discharge status: Home / Self Care | Payer: BC Managed Care – PPO | Source: Ambulatory Visit | Attending: Family Medicine | Admitting: Family Medicine

## 2022-07-16 ENCOUNTER — Ambulatory Visit (INDEPENDENT_AMBULATORY_CARE_PROVIDER_SITE_OTHER): Payer: BC Managed Care – PPO | Admitting: Family Medicine

## 2022-07-16 VITALS — BP 124/88 | HR 75 | Temp 96.5°F | Resp 12 | Ht 61.0 in | Wt 268.2 lb

## 2022-07-16 DIAGNOSIS — Z23 Encounter for immunization: Secondary | ICD-10-CM

## 2022-07-16 DIAGNOSIS — Z0000 Encounter for general adult medical examination without abnormal findings: Secondary | ICD-10-CM

## 2022-07-16 DIAGNOSIS — Z1159 Encounter for screening for other viral diseases: Secondary | ICD-10-CM

## 2022-07-16 DIAGNOSIS — I10 Essential (primary) hypertension: Secondary | ICD-10-CM | POA: Diagnosis not present

## 2022-07-16 DIAGNOSIS — Z124 Encounter for screening for malignant neoplasm of cervix: Secondary | ICD-10-CM

## 2022-07-16 DIAGNOSIS — Z131 Encounter for screening for diabetes mellitus: Secondary | ICD-10-CM | POA: Diagnosis not present

## 2022-07-16 MED ORDER — SEMAGLUTIDE (2 MG/DOSE) 8 MG/3ML ~~LOC~~ SOPN: 2.0000 mg | PEN_INJECTOR | SUBCUTANEOUS | 11 refills | Status: DC

## 2022-07-16 NOTE — Patient Instructions (Addendum)
Please stop at the lab to have labs drawn.  Work no continued diet change.. can consider semaglutide increase to 32 mg weekly if weight loss plateauing.  Try to do regular  low impact exercise. Schedule mammogram.  Consider Shingrix vaccine at pharmacy.

## 2022-07-16 NOTE — Progress Notes (Signed)
Patient ID: Carol Crosby, female    DOB: July 15, 1961, 61 y.o.   MRN: [id number]  This visit was conducted in person.  BP 124/88   Pulse 75   Temp (!) 96.5 F (35.8 C) (Temporal)   Resp 12   Ht '5\' 1"'$  (1.549 m)   Wt 268 lb 4 oz (121.7 kg)   LMP  (LMP Unknown)   SpO2 95%   BMI 50.69 kg/m    CC:  Chief Complaint  Patient presents with   Annual Exam    HPI: Carol Crosby is a 61 y.o. female presenting on 07/16/2022 for Annual Exam   She has been on semaglutide 1 mg now on it for the last month.  She has lost 40-42 lbs overall. Has helped with appetite.  She is looking into bariatric weight  surgery.  Wt Readings from Last 3 Encounters:  07/16/22 268 lb 4 oz (121.7 kg)  10/23/21 295 lb (133.8 kg)  09/25/21 (!) 307 lb (139.3 kg)    Hypertension:   Well controlled on no medication.   BP Readings from Last 3 Encounters:  07/16/22 124/88  10/23/21 (!) 162/85  09/25/21 128/80  Using medication without problems or lightheadedness:  none Chest pain with exertion: none Edema: occ Short of breath: none Average home BPs: Other issues:    Diet: working heart health, low carb, salad  Exercise: minimal exercise.. uses cane  Due for lab re-evaluate.  Relevant past medical, surgical, family and social history reviewed and updated as indicated. Interim medical history since our last visit reviewed. Allergies and medications reviewed and updated. Outpatient Medications Prior to Visit  Medication Sig Dispense Refill   Semaglutide-Weight Management 1 MG/0.5ML SOAJ Inject 1 mg into the skin once a week. (Patient not taking: Reported on 07/16/2022) 6 mL 0   nabumetone (RELAFEN) 750 MG tablet Take 750 mg by mouth 2 (two) times daily.     oxybutynin (DITROPAN) 5 MG tablet Take 1 tablet (5 mg total) by mouth at bedtime. 30 tablet 11   No facility-administered medications prior to visit.     Per HPI unless specifically indicated in ROS section below Review of Systems   Constitutional:  Negative for fatigue and fever.  HENT:  Negative for congestion.   Eyes:  Negative for pain.  Respiratory:  Negative for cough and shortness of breath.   Cardiovascular:  Negative for chest pain, palpitations and leg swelling.  Gastrointestinal:  Negative for abdominal pain.  Genitourinary:  Negative for dysuria and vaginal bleeding.  Musculoskeletal:  Negative for back pain.  Neurological:  Negative for syncope, light-headedness and headaches.  Psychiatric/Behavioral:  Negative for dysphoric mood.    Objective:  BP 124/88   Pulse 75   Temp (!) 96.5 F (35.8 C) (Temporal)   Resp 12   Ht '5\' 1"'$  (1.549 m)   Wt 268 lb 4 oz (121.7 kg)   LMP  (LMP Unknown)   SpO2 95%   BMI 50.69 kg/m   Wt Readings from Last 3 Encounters:  07/16/22 268 lb 4 oz (121.7 kg)  10/23/21 295 lb (133.8 kg)  09/25/21 (!) 307 lb (139.3 kg)      Physical Exam    Results for orders placed or performed during the hospital encounter of 01/75/10  Basic metabolic panel  Result Value Ref Range   Sodium 140 135 - 145 mmol/L   Potassium 4.0 3.5 - 5.1 mmol/L   Chloride 103 98 - 111 mmol/L   CO2 28  22 - 32 mmol/L   Glucose, Bld 91 70 - 99 mg/dL   BUN 13 6 - 20 mg/dL   Creatinine, Ser 0.63 0.44 - 1.00 mg/dL   Calcium 9.0 8.9 - 10.3 mg/dL   GFR, Estimated >60 >60 mL/min   Anion gap 9 5 - 15  CBC  Result Value Ref Range   WBC 7.1 4.0 - 10.5 K/uL   RBC 5.20 (H) 3.87 - 5.11 MIL/uL   Hemoglobin 15.4 (H) 12.0 - 15.0 g/dL   HCT 49.4 (H) 36.0 - 46.0 %   MCV 95.0 80.0 - 100.0 fL   MCH 29.6 26.0 - 34.0 pg   MCHC 31.2 30.0 - 36.0 g/dL   RDW 14.6 11.5 - 15.5 %   Platelets 249 150 - 400 K/uL   nRBC 0.0 0.0 - 0.2 %  Urinalysis, Routine w reflex microscopic Urine, Clean Catch  Result Value Ref Range   Color, Urine YELLOW YELLOW   APPearance HAZY (A) CLEAR   Specific Gravity, Urine 1.022 1.005 - 1.030   pH 5.5 5.0 - 8.0   Glucose, UA NEGATIVE NEGATIVE mg/dL   Hgb urine dipstick NEGATIVE NEGATIVE    Bilirubin Urine NEGATIVE NEGATIVE   Ketones, ur NEGATIVE NEGATIVE mg/dL   Protein, ur 30 (A) NEGATIVE mg/dL   Nitrite NEGATIVE NEGATIVE   Leukocytes,Ua NEGATIVE NEGATIVE   WBC, UA 0-5 0 - 5 WBC/hpf   Squamous Epithelial / LPF 6-10 0 - 5   Mucus PRESENT    Hyaline Casts, UA PRESENT      COVID 19 screen:  No recent travel or known exposure to COVID19 The patient denies respiratory symptoms of COVID 19 at this time. The importance of social distancing was discussed today.   Assessment and Plan The patient's preventative maintenance and recommended screening tests for an annual wellness exam were reviewed in full today. Brought up to date unless services declined.  Counselled on the importance of diet, exercise, and its role in overall health and mortality. The patient's FH and SH was reviewed, including their home life, tobacco status, and drug and alcohol status.   Vaccines: Consider Shingrix.Marland Kitchen given flu shot today. Pap/DVE:  due Mammo:  02/2021 due Bone Density: 11/2021 osteopenia Colon:  07/2021, repeat due in 10 years Smoking Status: former ETOH/ drug use: none/none  Hep C:  due   HIV screen:   refused  Problem List Items Addressed This Visit     HTN (hypertension), benign    Chronic, well controlled on no medication.      Relevant Orders   Lipid panel (Completed)   Comprehensive metabolic panel (Completed)   Morbid obesity ([place])    She is working aggressively on weight management with semaglutide 1 mg p.o. weekly.  She will consider increasing the dose to 2 mg weekly but is also looking towards bariatric surgery for definitive treatment.      Other Visit Diagnoses     Routine general medical examination at a health care facility    -  Primary   Screening for diabetes mellitus       Relevant Orders   Hemoglobin A1c   Need for hepatitis C screening test       Relevant Orders   Hepatitis C antibody (Completed)   Cervical cancer screening       Relevant Orders    Cytology - PAP([place])      Meds ordered this encounter  Medications   DISCONTD: Semaglutide, 2 MG/DOSE, 8 MG/3ML SOPN  Sig: Inject 2 mg as directed once a week.    Dispense:  3 mL    Refill:  11    Eliezer Lofts, MD

## 2022-07-17 ENCOUNTER — Other Ambulatory Visit: Payer: Self-pay | Admitting: *Deleted

## 2022-07-17 ENCOUNTER — Other Ambulatory Visit (INDEPENDENT_AMBULATORY_CARE_PROVIDER_SITE_OTHER): Payer: BC Managed Care – PPO

## 2022-07-17 DIAGNOSIS — Z131 Encounter for screening for diabetes mellitus: Secondary | ICD-10-CM

## 2022-07-17 LAB — COMPREHENSIVE METABOLIC PANEL
ALT: 15 U/L (ref 0–35)
AST: 13 U/L (ref 0–37)
Albumin: 4 g/dL (ref 3.5–5.2)
Alkaline Phosphatase: 125 U/L — ABNORMAL HIGH (ref 39–117)
BUN: 14 mg/dL (ref 6–23)
CO2: 29 mEq/L (ref 19–32)
Calcium: 8.9 mg/dL (ref 8.4–10.5)
Chloride: 102 mEq/L (ref 96–112)
Creatinine, Ser: 0.65 mg/dL (ref 0.40–1.20)
GFR: 95.63 mL/min (ref >60.00)
Glucose, Bld: 90 mg/dL (ref 70–99)
Potassium: 3.4 mEq/L — ABNORMAL LOW (ref 3.5–5.1)
Sodium: 141 mEq/L (ref 135–145)
Total Bilirubin: 0.8 mg/dL (ref 0.2–1.2)
Total Protein: 6.7 g/dL (ref 6.0–8.3)

## 2022-07-17 LAB — HEPATITIS C ANTIBODY: Hepatitis C Ab: NONREACTIVE

## 2022-07-17 LAB — LIPID PANEL
Cholesterol: 152 mg/dL (ref 0–200)
HDL: 41.6 mg/dL (ref >39.00)
LDL Cholesterol: 92 mg/dL (ref 0–99)
NonHDL: 110.73
Total CHOL/HDL Ratio: 4
Triglycerides: 93 mg/dL (ref 0.0–149.0)
VLDL: 18.6 mg/dL (ref 0.0–40.0)

## 2022-07-17 MED ORDER — WEGOVY 2.4 MG/0.75ML ~~LOC~~ SOAJ: 2.4000 mg | SUBCUTANEOUS | 11 refills | Status: DC

## 2022-07-17 NOTE — Assessment & Plan Note (Signed)
She is working aggressively on weight management with semaglutide 1 mg p.o. weekly.  She will consider increasing the dose to 2 mg weekly but is also looking towards bariatric surgery for definitive treatment.

## 2022-07-17 NOTE — Telephone Encounter (Signed)
Received fax from [facility] requesting [place] for [place].  Patient is not diabetic so Rx needs to be resent as [facility].   Please review order below and if okay,  please sign.

## 2022-07-17 NOTE — Assessment & Plan Note (Signed)
Chronic, well controlled on no medication.

## 2022-07-20 LAB — HEMOGLOBIN A1C: Hgb A1c MFr Bld: 5.8 % (ref 4.6–6.5)

## 2022-07-21 LAB — CYTOLOGY - PAP
Adequacy: ABSENT
Comment: NEGATIVE
Diagnosis: NEGATIVE
High risk HPV: NEGATIVE

## 2023-02-02 ENCOUNTER — Encounter: Payer: Self-pay | Admitting: Family Medicine

## 2023-02-04 ENCOUNTER — Other Ambulatory Visit: Payer: Self-pay | Admitting: Family Medicine

## 2023-02-05 ENCOUNTER — Encounter: Payer: Self-pay | Admitting: *Deleted

## 2023-07-31 ENCOUNTER — Encounter: Payer: Self-pay | Admitting: Family Medicine

## 2023-08-10 ENCOUNTER — Ambulatory Visit: Payer: BC Managed Care – PPO | Admitting: Family Medicine

## 2023-08-10 ENCOUNTER — Encounter: Payer: Self-pay | Admitting: Family Medicine

## 2023-08-10 VITALS — BP 132/84 | HR 71 | Temp 98.2°F | Ht 61.25 in | Wt 263.0 lb

## 2023-08-10 DIAGNOSIS — Z23 Encounter for immunization: Secondary | ICD-10-CM

## 2023-08-10 DIAGNOSIS — R0789 Other chest pain: Secondary | ICD-10-CM

## 2023-08-10 MED ORDER — PHENTERMINE-TOPIRAMATE ER 3.75-23 MG PO CP24: ORAL_CAPSULE | ORAL | 0 refills | Status: AC

## 2023-08-10 MED ORDER — CYCLOBENZAPRINE HCL 10 MG PO TABS: 10.0000 mg | ORAL_TABLET | Freq: Every evening | ORAL | 0 refills | Status: AC | PRN

## 2023-08-10 MED ORDER — DICLOFENAC SODIUM 75 MG PO TBEC: 75.0000 mg | DELAYED_RELEASE_TABLET | Freq: Two times a day (BID) | ORAL | 0 refills | Status: AC

## 2023-08-10 NOTE — Assessment & Plan Note (Signed)
Chronic, significant response to [facility] with 60 pound weight loss.  Unfortunately this is no longer covered by her insurance. She is continue to work aggressively on low carbohydrate diet and regular exercise without continued weight loss.  She has started gaining weight back.  We discussed options as far as referral to bariatric medical weight management center versus referral to bariatric surgery versus a trial of a different medication.  She would like to consider bariatric surgery but in the meantime would like to see if Qsymia is covered by her insurance.  Prescription for this was sent in at the starting dose.  She will follow-up in 4 weeks for weight management reevaluation.

## 2023-08-10 NOTE — Assessment & Plan Note (Signed)
Acute, most consistent with musculoskeletal strain occurring after she injured herself reaching into a washer and after recent move. Will treat with diclofenac 75 mg p.o. twice daily, heat and gentle stretching.  She can use Flexeril 15 mg p.o. nightly as needed for muscle spasm. No red flags or indication for x-ray at this time.  Return and ER precautions provided.

## 2023-08-10 NOTE — Progress Notes (Signed)
Patient ID: Carol Crosby, female    DOB: 1961/09/07, 62 y.o.   MRN: [id number]  This visit was conducted in person.  BP 132/84 (BP Location: Right Arm, Patient Position: Sitting, Cuff Size: Large)   Pulse 71   Temp 98.2 F (36.8 C) (Temporal)   Ht 5' 1.25" (1.556 m)   Wt 263 lb (119.3 kg)   LMP  (LMP Unknown)   SpO2 96%   BMI 49.29 kg/m    CC:  Chief Complaint  Patient presents with   Muscle Pain    Right Side   Weight Management Screening    Insurance doesn't cover [phone] anymore    Subjective:   HPI: Carol Crosby is a 62 y.o. female presenting on 08/10/2023 for Muscle Pain (Right Side) and Weight Management Screening (Insurance doesn't cover Wegovy anymore)   Right side pain over right breast and right upper chest wall.. she moved recently.. does remember leaning into washer.. had severe sudden [pain.  Has been  hurting since.  Pain with cough and sneeze.  Has voltaren cream, using tylenol and ibuprofen.  No cough, no fever.  No rash appeared. No breast lumps.   BMI 49 obesity: Wegovy no longer covered by insurance She last took this 12/2022.  She lost a total  60 lbs in this med... has been continuing to  work hard on healthy eating ( eating less, portion control, more veggies) and  walking more each day. Wt Readings from Last 3 Encounters:  08/10/23 263 lb (119.3 kg)  07/16/22 268 lb 4 oz (121.7 kg)  10/23/21 295 lb (133.8 kg)     NO CAD, NO CVA, NO DM.   HTN.. well controlled on no medications     Relevant past medical, surgical, family and social history reviewed and updated as indicated. Interim medical history since our last visit reviewed. Allergies and medications reviewed and updated. Outpatient Medications Prior to Visit  Medication Sig Dispense Refill   WEGOVY 2.4 MG/0.75ML SOAJ Inject 2.4 mg into the skin once a week. 3 mL 11   No facility-administered medications prior to visit.     Per HPI unless specifically indicated in ROS section  below Review of Systems  Constitutional:  Negative for fatigue and fever.  HENT:  Negative for congestion.   Eyes:  Negative for pain.  Respiratory:  Negative for cough and shortness of breath.   Cardiovascular:  Negative for chest pain, palpitations and leg swelling.  Gastrointestinal:  Negative for abdominal pain.  Genitourinary:  Negative for dysuria and vaginal bleeding.  Musculoskeletal:  Negative for back pain.  Neurological:  Negative for syncope, light-headedness and headaches.  Psychiatric/Behavioral:  Negative for dysphoric mood.    Objective:  BP 132/84 (BP Location: Right Arm, Patient Position: Sitting, Cuff Size: Large)   Pulse 71   Temp 98.2 F (36.8 C) (Temporal)   Ht 5' 1.25" (1.556 m)   Wt 263 lb (119.3 kg)   LMP  (LMP Unknown)   SpO2 96%   BMI 49.29 kg/m   Wt Readings from Last 3 Encounters:  08/10/23 263 lb (119.3 kg)  07/16/22 268 lb 4 oz (121.7 kg)  10/23/21 295 lb (133.8 kg)      Physical Exam Constitutional:      General: She is not in acute distress.    Appearance: Normal appearance. She is well-developed. She is obese. She is not ill-appearing or toxic-appearing.  HENT:     Head: Normocephalic.     Right Ear:  Hearing, tympanic membrane, ear canal and external ear normal. Tympanic membrane is not erythematous, retracted or bulging.     Left Ear: Hearing, tympanic membrane, ear canal and external ear normal. Tympanic membrane is not erythematous, retracted or bulging.     Nose: No mucosal edema or rhinorrhea.     Right Sinus: No maxillary sinus tenderness or frontal sinus tenderness.     Left Sinus: No maxillary sinus tenderness or frontal sinus tenderness.     Mouth/Throat:     Mouth: Oropharynx is clear and moist and mucous membranes are normal.     Pharynx: Uvula midline.  Eyes:     General: Lids are normal. Lids are everted, no foreign bodies appreciated.     Extraocular Movements: EOM normal.     Conjunctiva/sclera: Conjunctivae normal.      Pupils: Pupils are equal, round, and reactive to light.  Neck:     Thyroid: No thyroid mass or thyromegaly.     Vascular: No carotid bruit.     Trachea: Trachea normal.  Cardiovascular:     Rate and Rhythm: Normal rate and regular rhythm.     Pulses: Normal pulses.     Heart sounds: Normal heart sounds, S1 normal and S2 normal. No murmur heard.    No friction rub. No gallop.  Pulmonary:     Effort: Pulmonary effort is normal. No tachypnea or respiratory distress.     Breath sounds: Normal breath sounds. No decreased breath sounds, wheezing, rhonchi or rales.  Chest:     Chest wall: Tenderness present.     Comments: Chest wall tenderness anteriorly above breast and radiating laterally around side No skin changes, no lymphadenopathy, no breast mass noted Abdominal:     General: Bowel sounds are normal.     Palpations: Abdomen is soft.     Tenderness: There is no abdominal tenderness.  Musculoskeletal:     Cervical back: Normal range of motion and neck supple.  Skin:    General: Skin is warm, dry and intact.     Findings: No rash.  Neurological:     Mental Status: She is alert.  Psychiatric:        Mood and Affect: Mood is not anxious or depressed.        Speech: Speech normal.        Behavior: Behavior normal. Behavior is cooperative.        Thought Content: Thought content normal.        Cognition and Memory: Cognition and memory normal.        Judgment: Judgment normal.       Results for orders placed or performed in visit on 07/17/22  Hemoglobin A1c  Result Value Ref Range   Hgb A1c MFr Bld 5.8 4.6 - 6.5 %    Assessment and Plan  Chest wall pain Assessment & Plan: Acute, most consistent with musculoskeletal strain occurring after she injured herself reaching into a washer and after recent move. Will treat with diclofenac 75 mg p.o. twice daily, heat and gentle stretching.  She can use Flexeril 15 mg p.o. nightly as needed for muscle spasm. No red flags or indication  for x-ray at this time.  Return and ER precautions provided.   Morbid obesity (H[facility]) Assessment & Plan: Chronic, significant response to [facility] with 60 pound weight loss.  Unfortunately this is no longer covered by her insurance. She is continue to work aggressively on low carbohydrate diet and regular exercise without continued weight loss.  She  has started gaining weight back.  We discussed options as far as referral to bariatric medical weight management center versus referral to bariatric surgery versus a trial of a different medication.  She would like to consider bariatric surgery but in the meantime would like to see if Qsymia is covered by her insurance.  Prescription for this was sent in at the starting dose.  She will follow-up in 4 weeks for weight management reevaluation.   Need for influenza vaccination -     Flu vaccine trivalent PF, 6mos and older(Flulaval,Afluria,Fluarix,Fluzone)  Other orders -     Phentermine-Topiramate; 1 capsule  po daily  Dispense: 30 capsule; Refill: 0 -     Diclofenac Sodium; Take 1 tablet (75 mg total) by mouth 2 (two) times daily.  Dispense: 30 tablet; Refill: 0 -     Cyclobenzaprine HCl; Take 1 tablet (10 mg total) by mouth at bedtime as needed for muscle spasms.  Dispense: 15 tablet; Refill: 0    Return in about 4 weeks (around 09/07/2023) for  follow up on weight managment.   Kerby Nora, MD

## 2023-08-19 ENCOUNTER — Encounter: Payer: Self-pay | Admitting: Family Medicine

## 2023-08-23 ENCOUNTER — Telehealth: Payer: Self-pay

## 2023-08-23 ENCOUNTER — Other Ambulatory Visit (HOSPITAL_COMMUNITY): Payer: Self-pay

## 2023-08-23 NOTE — Telephone Encounter (Signed)
*  Primary  Pharmacy Patient Advocate Encounter  Received notification from [facility] that Prior Authorization for Qsymia 3.75-23MG  er capsules  has been APPROVED from 08/23/2023 to 11/21/2023. Ran test claim, Copay is $30.00. This test claim was processed through [facility]- copay amounts may vary at other pharmacies due to pharmacy/plan contracts, or as the patient moves through the different stages of their insurance plan.   PA #/Case ID/Reference #: [id number]

## 2023-08-23 NOTE — Telephone Encounter (Signed)
PA request has been Approved. New Encounter created for follow up. For additional info see Pharmacy Prior Auth telephone encounter from 10/28.

## 2023-09-21 ENCOUNTER — Encounter: Payer: Self-pay | Admitting: Family Medicine

## 2024-03-06 ENCOUNTER — Emergency Department (HOSPITAL_BASED_OUTPATIENT_CLINIC_OR_DEPARTMENT_OTHER)

## 2024-03-06 ENCOUNTER — Emergency Department (HOSPITAL_BASED_OUTPATIENT_CLINIC_OR_DEPARTMENT_OTHER)
Admission: EM | Admit: 2024-03-06 | Discharge: 2024-03-07 | Disposition: A | Discharge status: Home / Self Care | Attending: Emergency Medicine | Admitting: Emergency Medicine

## 2024-03-06 ENCOUNTER — Encounter (HOSPITAL_BASED_OUTPATIENT_CLINIC_OR_DEPARTMENT_OTHER): Payer: Self-pay

## 2024-03-06 DIAGNOSIS — J3489 Other specified disorders of nose and nasal sinuses: Secondary | ICD-10-CM | POA: Insufficient documentation

## 2024-03-06 DIAGNOSIS — I10 Essential (primary) hypertension: Secondary | ICD-10-CM | POA: Insufficient documentation

## 2024-03-06 DIAGNOSIS — Z85828 Personal history of other malignant neoplasm of skin: Secondary | ICD-10-CM | POA: Insufficient documentation

## 2024-03-06 DIAGNOSIS — W1812XA Fall from or off toilet with subsequent striking against object, initial encounter: Secondary | ICD-10-CM | POA: Insufficient documentation

## 2024-03-06 DIAGNOSIS — S0083XA Contusion of other part of head, initial encounter: Secondary | ICD-10-CM | POA: Insufficient documentation

## 2024-03-06 DIAGNOSIS — S0990XA Unspecified injury of head, initial encounter: Secondary | ICD-10-CM | POA: Diagnosis present

## 2024-03-06 DIAGNOSIS — R55 Syncope and collapse: Secondary | ICD-10-CM | POA: Diagnosis not present

## 2024-03-06 LAB — CBC
HCT: 45.3 % (ref 36.0–46.0)
Hemoglobin: 14.5 g/dL (ref 12.0–15.0)
MCH: 30.5 pg (ref 26.0–34.0)
MCHC: 32 g/dL (ref 30.0–36.0)
MCV: 95.4 fL (ref 80.0–100.0)
Platelets: 270 10*3/uL (ref 150–400)
RBC: 4.75 MIL/uL (ref 3.87–5.11)
RDW: 13.9 % (ref 11.5–15.5)
WBC: 10.3 10*3/uL (ref 4.0–10.5)
nRBC: 0 % (ref 0.0–0.2)

## 2024-03-06 LAB — COMPREHENSIVE METABOLIC PANEL WITH GFR
ALT: 14 U/L (ref 0–44)
AST: 19 U/L (ref 15–41)
Albumin: 4.3 g/dL (ref 3.5–5.0)
Alkaline Phosphatase: 167 U/L — ABNORMAL HIGH (ref 38–126)
Anion gap: 15 (ref 5–15)
BUN: 12 mg/dL (ref 8–23)
CO2: 22 mmol/L (ref 22–32)
Calcium: 9.5 mg/dL (ref 8.9–10.3)
Chloride: 102 mmol/L (ref 98–111)
Creatinine, Ser: 0.68 mg/dL (ref 0.44–1.00)
GFR, Estimated: >60 mL/min (ref >60)
Glucose, Bld: 113 mg/dL — ABNORMAL HIGH (ref 70–99)
Potassium: 3.9 mmol/L (ref 3.5–5.1)
Sodium: 139 mmol/L (ref 135–145)
Total Bilirubin: 0.5 mg/dL (ref 0.0–1.2)
Total Protein: 7.1 g/dL (ref 6.5–8.1)

## 2024-03-06 LAB — URINALYSIS, ROUTINE W REFLEX MICROSCOPIC
Bilirubin Urine: NEGATIVE
Glucose, UA: NEGATIVE mg/dL
Hgb urine dipstick: NEGATIVE
Ketones, ur: NEGATIVE mg/dL
Leukocytes,Ua: NEGATIVE
Nitrite: NEGATIVE
Protein, ur: 30 mg/dL — AB
Specific Gravity, Urine: 1.028 (ref 1.005–1.030)
pH: 6 (ref 5.0–8.0)

## 2024-03-06 MED ORDER — ONDANSETRON 4 MG PO TBDP
4.0000 mg | ORAL_TABLET | Freq: Once | ORAL | Status: AC
  Administered 2024-03-06: 4 mg via ORAL
  Filled 2024-03-06: qty 1

## 2024-03-06 NOTE — ED Notes (Signed)
 Pt w one episode of emesis in triage, approx 50mL bile noted in emesis bag.

## 2024-03-06 NOTE — ED Triage Notes (Signed)
 Pt c/o "been throwing up today, passed out & hit my head." Concern for broken nose, states she "woke up in the floor."

## 2024-03-07 LAB — TROPONIN T, HIGH SENSITIVITY: Troponin T High Sensitivity: <15 ng/L (ref <19)

## 2024-03-07 NOTE — Discharge Instructions (Signed)

## 2024-03-07 NOTE — ED Provider Notes (Signed)
 Emergency Department Provider Note   I have reviewed the triage vital signs and the nursing notes.   HISTORY  Chief Complaint Nausea, Loss of Consciousness, and Facial Injury   HPI Carol Crosby is a 63 y.o. female with past history reviewed below presents emergency department for evaluation of nausea/vomiting and syncope.  Patient states she began having nausea and vomiting this afternoon.  She went to the bathroom to sit down and pee and suddenly felt the sudden urge to vomit.  At that time she passed out falling forward off of the toilet and striking her nose on a door jam.  Her loss of consciousness was brief.  She had no palpitations or chest pain.  No sudden shortness of breath or severe headache.  She has some soreness of her nose.  No diarrhea.   Past Medical History:  Diagnosis Date   Abdominal pain, right upper quadrant    Acute sinusitis, unspecified    Arthritis    knees, back, hip    Cancer (HCC)    hx of skin cancer    Esophageal reflux    pt denies on 09/10/20    Excessive or frequent menstruation    History of bronchitis    Hx of adenomatous polyp of colon 08/18/2021   Hypersomnia, unspecified    Hypertension    pt denies at preop visit of 09/10/20    Mixed incontinence urge and stress (female)(female)    Morbid obesity (HCC)    Obstructive sleep apnea (adult) (pediatric)    cpap machine    Other chest pain    Palpitations    PONV (postoperative nausea and vomiting)    Scarlet fever    Sleep apnea    cpap   Streptococcal sore throat    Temporomandibular joint disorders, unspecified    Unspecified urinary incontinence     Review of Systems  Constitutional: No fever/chills Eyes: No visual changes. ENT: No sore throat. Positive nasal pain after syncope.  Cardiovascular: Denies chest pain. Respiratory: Denies shortness of breath. Gastrointestinal: No abdominal pain. Positive nausea and vomiting.  No diarrhea.  Skin: Negative for rash. Neurological:  Negative for headaches. ____________________________________________   PHYSICAL EXAM:  VITAL SIGNS: ED Triage Vitals  Encounter Vitals Group     BP 03/06/24 2029 (!) 159/94     Pulse Rate 03/06/24 2029 (!) 105     Resp 03/06/24 2029 19     Temp 03/06/24 2029 98.2 F (36.8 C)     Temp src --      SpO2 03/06/24 2029 93 %   Constitutional: Alert and oriented. Well appearing and in no acute distress. Eyes: Conjunctivae are normal.  Head: Atraumatic. Nose: No congestion/rhinnorhea. Mild swelling without ecchymosis. No epistaxis.  Mouth/Throat: Mucous membranes are moist.   Neck: No stridor.   Cardiovascular: Normal rate, regular rhythm. Good peripheral circulation. Grossly normal heart sounds.   Respiratory: Normal respiratory effort.  No retractions. Lungs CTAB. Gastrointestinal: Soft and nontender. No distention.  Musculoskeletal: No lower extremity tenderness nor edema. No gross deformities of extremities. Neurologic:  Normal speech and language.  Skin:  Skin is warm, dry and intact. No rash noted.  ____________________________________________   LABS (all labs ordered are listed, but only abnormal results are displayed)  Labs Reviewed  COMPREHENSIVE METABOLIC PANEL WITH GFR - Abnormal; Notable for the following components:      Result Value   Glucose, Bld 113 (*)    Alkaline Phosphatase 167 (*)    All other  components within normal limits  URINALYSIS, ROUTINE W REFLEX MICROSCOPIC - Abnormal; Notable for the following components:   Protein, ur 30 (*)    Bacteria, UA RARE (*)    All other components within normal limits  CBC  CBG MONITORING, ED  TROPONIN T, HIGH SENSITIVITY   ____________________________________________  EKG   EKG Interpretation Date/Time:  Monday Mar 06 2024 20:34:50 EDT Ventricular Rate:  100 PR Interval:  184 QRS Duration:  88 QT Interval:  334 QTC Calculation: 430 R Axis:   45  Text Interpretation: Normal sinus rhythm Cannot rule out  Anterior infarct (cited on or before 10-Sep-2020) Abnormal ECG When compared with ECG of 23-Oct-2021 14:43, Questionable change in QRS axis Nonspecific T wave abnormality no longer evident in Anterolateral leads Confirmed by Abby Hocking [phone]) on 03/07/2024 4:06:24 AM        ____________________________________________  RADIOLOGY  CT Head Wo Contrast Result Date: 03/06/2024 CLINICAL DATA:  Status post fall. EXAM: CT HEAD WITHOUT CONTRAST TECHNIQUE: Contiguous axial images were obtained from the base of the skull through the vertex without intravenous contrast. RADIATION DOSE REDUCTION: This exam was performed according to the departmental dose-optimization program which includes automated exposure control, adjustment of the mA and/or kV according to patient size and/or use of iterative reconstruction technique. COMPARISON:  None Available. FINDINGS: Brain: No evidence of acute infarction, hemorrhage, hydrocephalus, extra-axial collection or mass lesion/mass effect. Vascular: No hyperdense vessel or unexpected calcification. Skull: Normal. Negative for fracture or focal lesion. Sinuses/Orbits: No acute finding. Other: None. IMPRESSION: No acute intracranial pathology. Electronically Signed   By: Virgle Grime M.D.   On: 03/06/2024 21:06   CT Maxillofacial Wo Contrast Result Date: 03/06/2024 CLINICAL DATA:  Status post fall. EXAM: CT MAXILLOFACIAL WITHOUT CONTRAST TECHNIQUE: Multidetector CT imaging of the maxillofacial structures was performed. Multiplanar CT image reconstructions were also generated. RADIATION DOSE REDUCTION: This exam was performed according to the departmental dose-optimization program which includes automated exposure control, adjustment of the mA and/or kV according to patient size and/or use of iterative reconstruction technique. COMPARISON:  None Available. FINDINGS: Osseous: No fracture or mandibular dislocation. No destructive process. Orbits: Negative. No traumatic or  inflammatory finding. Sinuses: Clear. Soft tissues: Mild bilateral paranasal soft tissue swelling is noted. Limited intracranial: No significant or unexpected finding. IMPRESSION: Mild bilateral paranasal soft tissue swelling without evidence of an acute fracture or dislocation. Electronically Signed   By: Virgle Grime M.D.   On: 03/06/2024 21:05    ____________________________________________   PROCEDURES  Procedure(s) performed:   Procedures  None  ____________________________________________   INITIAL IMPRESSION / ASSESSMENT AND PLAN / ED COURSE  Pertinent labs & imaging results that were available during my care of the patient were reviewed by me and considered in my medical decision making (see chart for details).   This patient is Presenting for Evaluation of syncope, which does require a range of treatment options, and is a complaint that involves a high risk of morbidity and mortality.  The Differential Diagnoses include vasovagal syncope, arrhythmia, PE, ACS, SAH, etc.  Critical Interventions-    Medications  ondansetron  (ZOFRAN -ODT) disintegrating tablet 4 mg (4 mg Oral Given 03/06/24 2033)    Reassessment after intervention: symptoms improved. Patient tolerating PO.   Clinical Laboratory Tests Ordered, included CMP with mild elevated alk phos, normal bilirubin.  CBC without leukocytosis.  Troponin normal.  No UTI.  Radiologic Tests Ordered, included CT head and max/face. I independently interpreted the images and agree with radiology interpretation.  Cardiac Monitor Tracing which shows NSR.    Social Determinants of Health Risk patient is a non-smoker.  Medical Decision Making: Summary:  Patient presents emergency department evaluation of syncope.  Seems vasovagal given the context.  EKG without acute ischemic change or arrhythmia.  I added a troponin which is normal.  CMP is reassuring.  CT imaging of the head and max face obtained from triage showing no  bleeding or acute fracture.  Reevaluation with update and discussion with patient.  She is feeling improved after Zofran .  Offered IV fluids but she is feeling better and tolerating p.o.  Prefers discharge at this time.  Considered admission but ED workup is overall reassuring. Stable for discharge.   Patient's presentation is most consistent with acute presentation with potential threat to life or bodily function.   Disposition: discharge  ____________________________________________  FINAL CLINICAL IMPRESSION(S) / ED DIAGNOSES  Final diagnoses:  Vasovagal syncope  Contusion of face, initial encounter     Note:  This document was prepared using Dragon voice recognition software and may include unintentional dictation errors.  Abby Hocking, MD, [facility]    Dollie Mayse, Shereen Dike, MD 03/07/24 [phone]

## 2024-03-23 ENCOUNTER — Encounter (INDEPENDENT_AMBULATORY_CARE_PROVIDER_SITE_OTHER): Payer: Self-pay | Admitting: Otolaryngology

## 2024-03-23 ENCOUNTER — Ambulatory Visit (INDEPENDENT_AMBULATORY_CARE_PROVIDER_SITE_OTHER): Admitting: Otolaryngology

## 2024-03-23 VITALS — BP 177/79 | HR 79

## 2024-03-23 DIAGNOSIS — K219 Gastro-esophageal reflux disease without esophagitis: Secondary | ICD-10-CM

## 2024-03-23 DIAGNOSIS — R490 Dysphonia: Secondary | ICD-10-CM

## 2024-03-23 DIAGNOSIS — R09A2 Foreign body sensation, throat: Secondary | ICD-10-CM

## 2024-03-23 DIAGNOSIS — R0981 Nasal congestion: Secondary | ICD-10-CM | POA: Diagnosis not present

## 2024-03-23 DIAGNOSIS — J310 Chronic rhinitis: Secondary | ICD-10-CM

## 2024-03-23 DIAGNOSIS — J343 Hypertrophy of nasal turbinates: Secondary | ICD-10-CM | POA: Diagnosis not present

## 2024-03-23 DIAGNOSIS — J342 Deviated nasal septum: Secondary | ICD-10-CM | POA: Diagnosis not present

## 2024-03-25 DIAGNOSIS — R490 Dysphonia: Secondary | ICD-10-CM | POA: Insufficient documentation

## 2024-03-25 DIAGNOSIS — J310 Chronic rhinitis: Secondary | ICD-10-CM | POA: Insufficient documentation

## 2024-03-25 DIAGNOSIS — J343 Hypertrophy of nasal turbinates: Secondary | ICD-10-CM | POA: Insufficient documentation

## 2024-03-25 DIAGNOSIS — J342 Deviated nasal septum: Secondary | ICD-10-CM | POA: Insufficient documentation

## 2024-03-25 NOTE — Progress Notes (Signed)
 Patient ID: Carol Crosby, female   DOB: 1961-03-16, 63 y.o.   MRN: [id number]  New complaint: Facial and nasal trauma Follow-up: Globus sensation, hoarseness, laryngopharyngeal reflux  HPI: The patient is a 63 year old female who presents today with a new complaint of nasal trauma.  According to the patient, she had an accidental fall 2 weeks ago after passing out.  She hit her nose on a door jam.  She was seen at the [facility] emergency room.  The facial CT scan showed no displaced fracture.  The patient presents today complaining of nasal tenderness and a possible bump inside her nose.  She is able to breathe through her nostrils.  The patient was previously seen for globus sensation, hoarseness, and laryngopharyngeal reflux.  She was treated with famotidine and omeprazole.  She complains of persistent globus sensation.  However, she has stopped the reflux medications.  She denies any significant dysphagia, odynophagia, or dyspnea.  Exam: General: Communicates without difficulty, well nourished, no acute distress. Head: Normocephalic, no evidence injury, no tenderness, facial buttresses intact without stepoff. Face/sinus: No tenderness to palpation and percussion. Facial movement is normal and symmetric. Eyes: PERRL, EOMI. No scleral icterus, conjunctivae clear. Neuro: CN II exam reveals vision grossly intact.  No nystagmus at any point of gaze. Ears: Auricles well formed without lesions.  Ear canals are intact without mass or lesion.  No erythema or edema is appreciated.  The TMs are intact without fluid. Nose: External evaluation reveals normal support and skin without lesions.  Dorsum is intact.  Anterior rhinoscopy reveals congested mucosa over anterior aspect of inferior turbinates and intact septum.  No bleeding noted. Oral:  Oral cavity and oropharynx are intact, symmetric, without erythema or edema.  Mucosa is moist without lesions. Neck: Full range of motion without pain.  There is no significant  lymphadenopathy.  No masses palpable.  Thyroid  bed within normal limits to palpation.  Parotid glands and submandibular glands equal bilaterally without mass.  Trachea is midline. Neuro:  CN 2-12 grossly intact.   Procedure:  Flexible Nasal Endoscopy: Description: Risks, benefits, and alternatives of flexible endoscopy were explained to the patient.  Specific mention was made of the risk of throat numbness with difficulty swallowing, possible bleeding from the nose and mouth, and pain from the procedure.  The patient gave oral consent to proceed.  The flexible scope was inserted into the right nasal cavity.  Endoscopy of the interior nasal cavity, superior, inferior, and middle meatus was performed. The sphenoid-ethmoid recess was examined. Edematous mucosa was noted.  No polyp, mass, or lesion was appreciated. Olfactory cleft was clear.  Nasopharynx was clear.  Turbinates were hypertrophied but without mass.  The procedure was repeated on the contralateral side with similar findings.  The patient tolerated the procedure well.    Assessment: 1.  Recent nasal trauma, without displaced nasal bone fractures.  No significant deformity is noted on today's physical and nasal endoscopy examination. 2.  Chronic rhinitis with nasal mucosal congestion and bilateral inferior turbinate hypertrophy. 3.  History of globus sensation, dysphonia, and laryngopharyngeal reflux.  The patient is still symptomatic.  Plan: 1.  The physical exam and nasal endoscopy findings are reviewed with the patient. 2.  The patient is reassured that no external or internal nasal deformity is noted on today's examination. 3.  Flonase  nasal spray 2 sprays each nostril daily.  The importance of consistent daily use is discussed. 4.  The patient is encouraged to resume her famotidine and omeprazole. 5.  She is encouraged to call with any questions or concerns.
